# Patient Record
Sex: Male | Born: 1945 | Race: White | Hispanic: No | Marital: Single | Smoking: Current every day smoker
Health system: Southern US, Community
[De-identification: ages and names within clinical notes are randomized; demographics above are authoritative.]

## PROBLEM LIST (undated history)

## (undated) DIAGNOSIS — E781 Pure hyperglyceridemia: Secondary | ICD-10-CM

## (undated) DIAGNOSIS — C801 Malignant (primary) neoplasm, unspecified: Secondary | ICD-10-CM

## (undated) DIAGNOSIS — F32A Depression, unspecified: Secondary | ICD-10-CM

## (undated) DIAGNOSIS — F329 Major depressive disorder, single episode, unspecified: Secondary | ICD-10-CM

## (undated) DIAGNOSIS — T7840XA Allergy, unspecified, initial encounter: Secondary | ICD-10-CM

## (undated) DIAGNOSIS — R0600 Dyspnea, unspecified: Secondary | ICD-10-CM

## (undated) DIAGNOSIS — K219 Gastro-esophageal reflux disease without esophagitis: Secondary | ICD-10-CM

## (undated) DIAGNOSIS — J449 Chronic obstructive pulmonary disease, unspecified: Secondary | ICD-10-CM

## (undated) DIAGNOSIS — J439 Emphysema, unspecified: Secondary | ICD-10-CM

## (undated) DIAGNOSIS — H269 Unspecified cataract: Secondary | ICD-10-CM

## (undated) DIAGNOSIS — F1020 Alcohol dependence, uncomplicated: Secondary | ICD-10-CM

## (undated) DIAGNOSIS — M1990 Unspecified osteoarthritis, unspecified site: Secondary | ICD-10-CM

## (undated) DIAGNOSIS — F419 Anxiety disorder, unspecified: Secondary | ICD-10-CM

## (undated) DIAGNOSIS — I10 Essential (primary) hypertension: Secondary | ICD-10-CM

## (undated) HISTORY — DX: Pure hyperglyceridemia: E78.1

## (undated) HISTORY — DX: Gastro-esophageal reflux disease without esophagitis: K21.9

## (undated) HISTORY — DX: Essential (primary) hypertension: I10

## (undated) HISTORY — DX: Chronic obstructive pulmonary disease, unspecified: J44.9

## (undated) HISTORY — DX: Emphysema, unspecified: J43.9

## (undated) HISTORY — DX: Alcohol dependence, uncomplicated: F10.20

## (undated) HISTORY — DX: Depression, unspecified: F32.A

## (undated) HISTORY — DX: Allergy, unspecified, initial encounter: T78.40XA

## (undated) HISTORY — DX: Anxiety disorder, unspecified: F41.9

## (undated) HISTORY — DX: Major depressive disorder, single episode, unspecified: F32.9

## (undated) HISTORY — DX: Unspecified cataract: H26.9

## (undated) HISTORY — PX: TONSILLECTOMY: SUR1361

---
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
## 2010-06-01 HISTORY — PX: COLONOSCOPY: SHX174

## 2010-12-12 ENCOUNTER — Ambulatory Visit: Payer: Self-pay | Admitting: Family Medicine

## 2012-06-01 HISTORY — PX: CATARACT EXTRACTION, BILATERAL: SHX1313

## 2012-06-14 ENCOUNTER — Ambulatory Visit: Payer: Self-pay | Admitting: Ophthalmology

## 2012-06-14 LAB — POTASSIUM: Potassium: 3.9 mmol/L (ref 3.5–5.1)

## 2012-06-20 ENCOUNTER — Ambulatory Visit: Payer: Self-pay | Admitting: Ophthalmology

## 2012-07-20 IMAGING — US SCREENING ULTRASOUND OF ABDOMINAL AORTA
1 series · 17 of 24 positions shown · non-contrast
Comparison: none

REASON FOR EXAM: AAA screen welcome to Medicare
COMMENTS:

[Series 1: screening ultrasound of abdominal aorta · 17 of 24 slices shown]
[im 1/24]
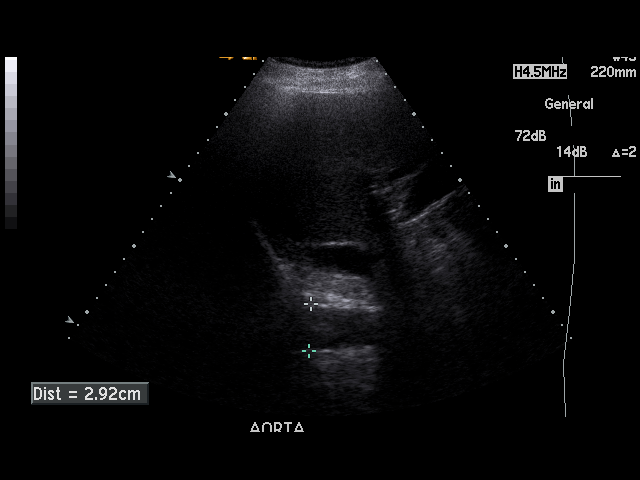
[im 3/24]
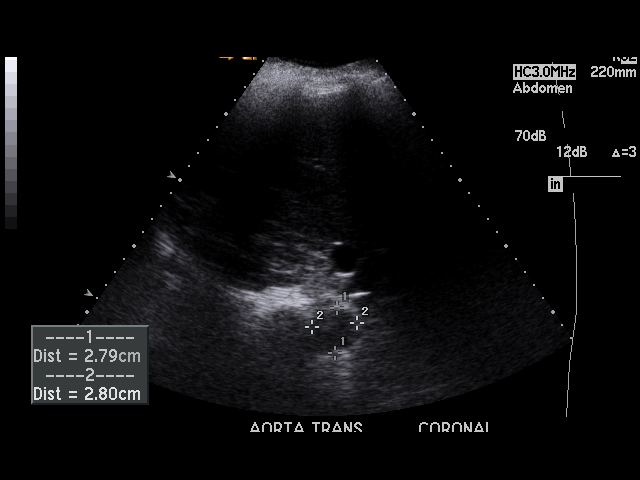
[im 4/24]
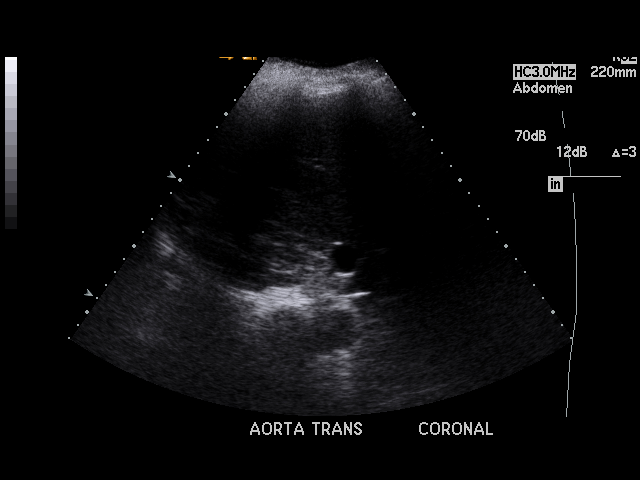
[im 5/24]
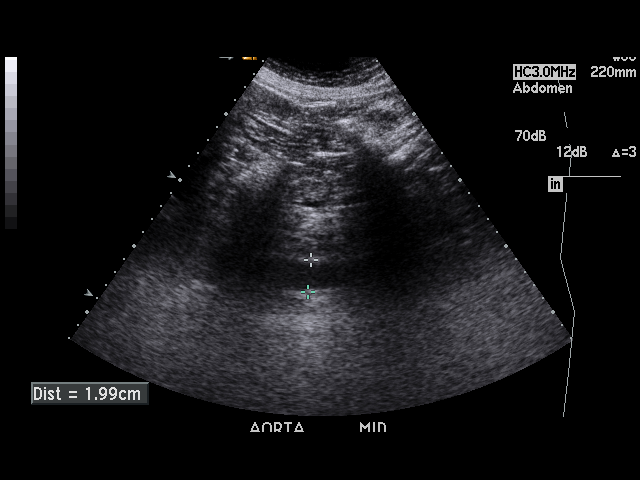
[im 7/24]
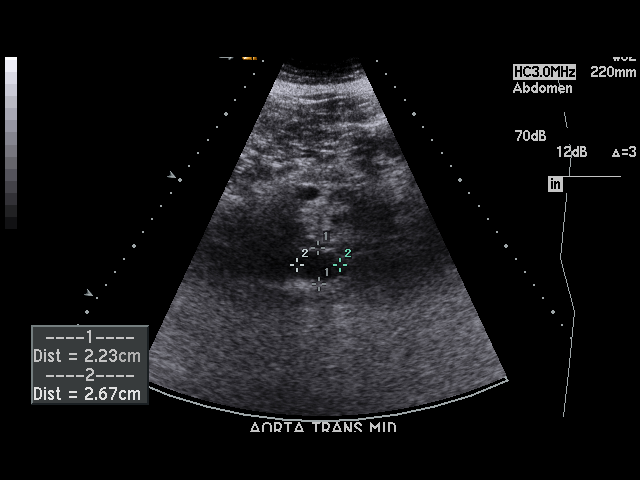
[im 8/24]
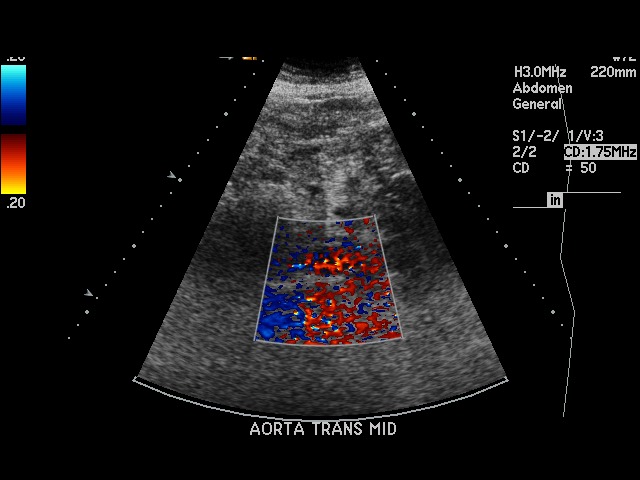
[im 10/24]
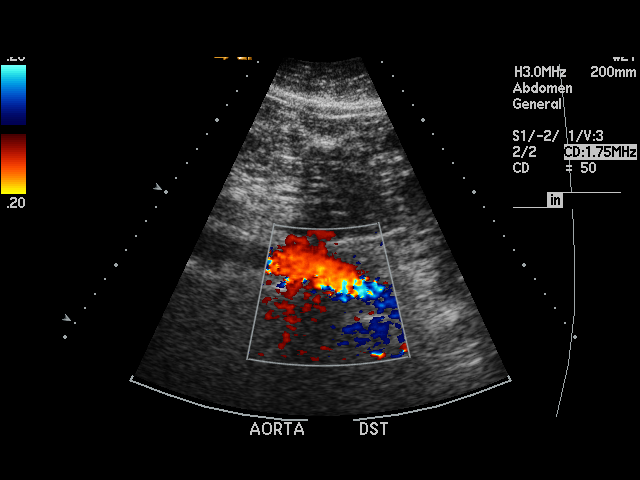
[im 11/24]
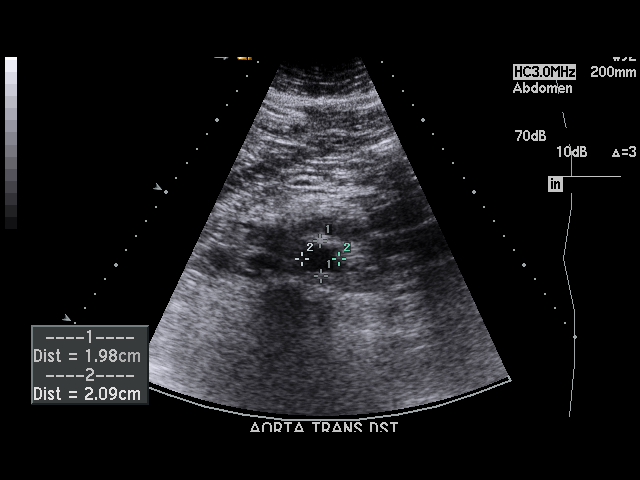
[im 13/24]
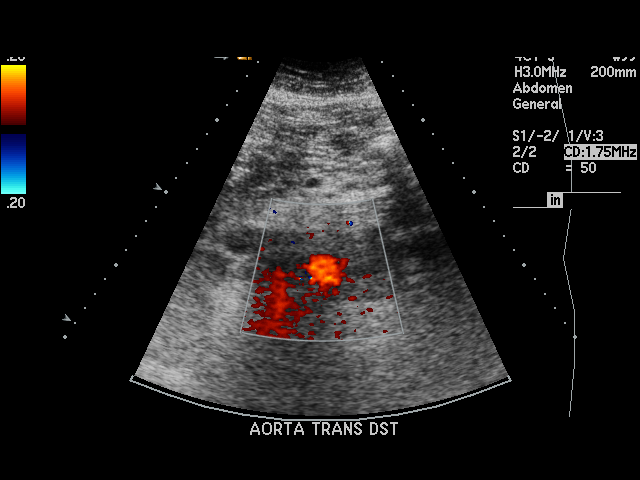
[im 14/24]
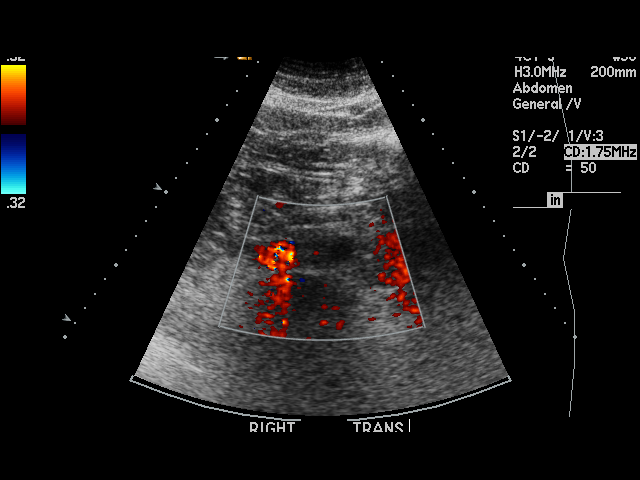
[im 15/24]
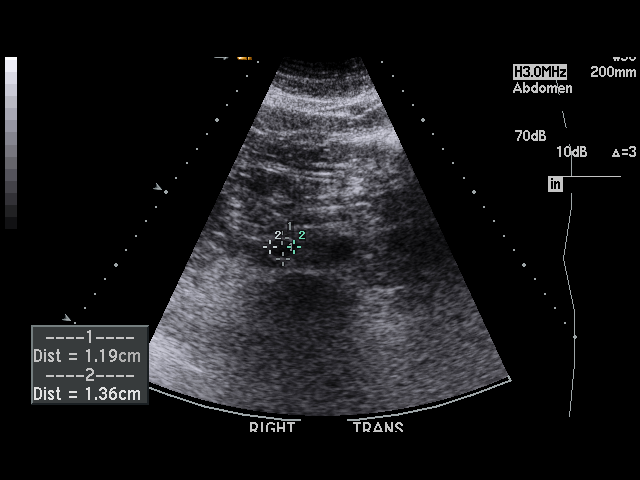
[im 17/24]
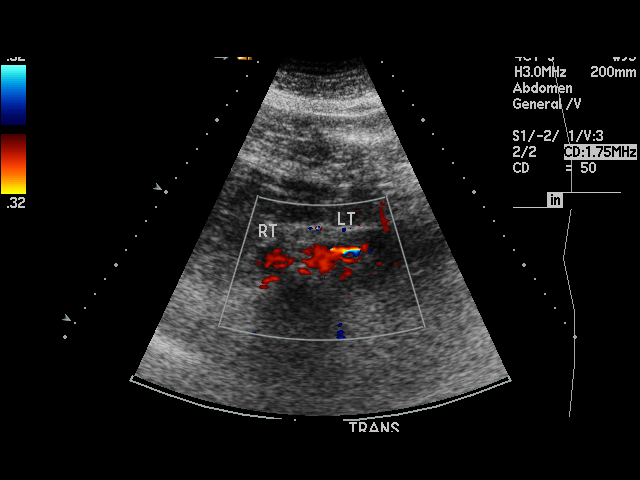
[im 18/24]
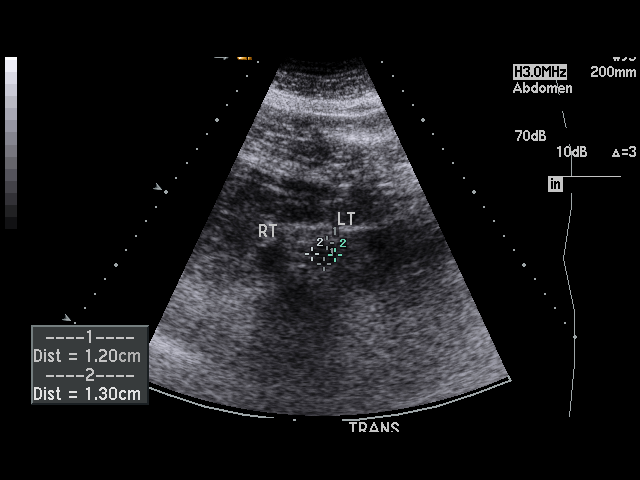
[im 20/24]
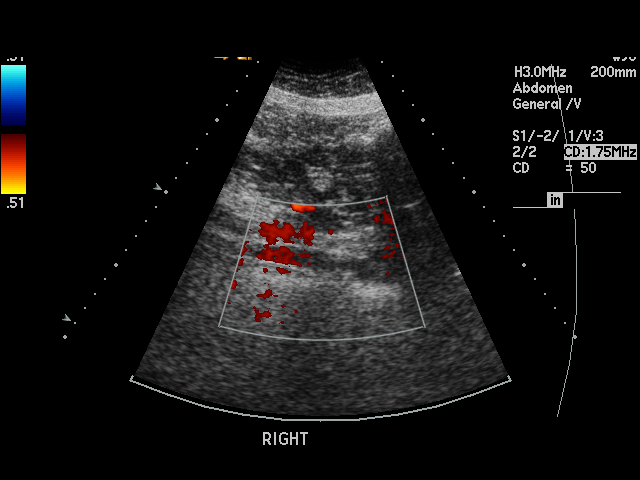
[im 21/24]
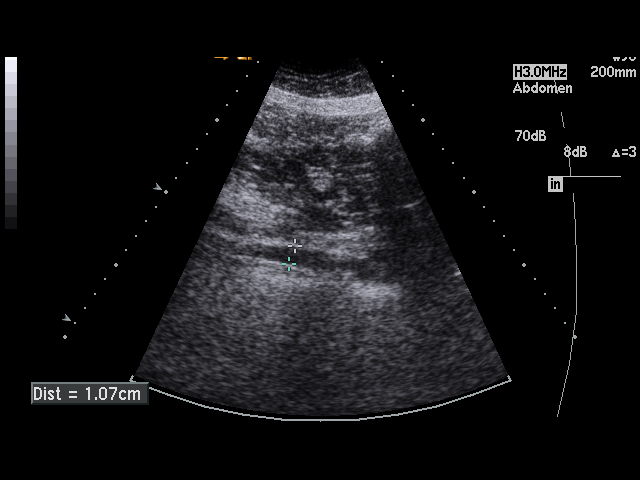
[im 22/24]
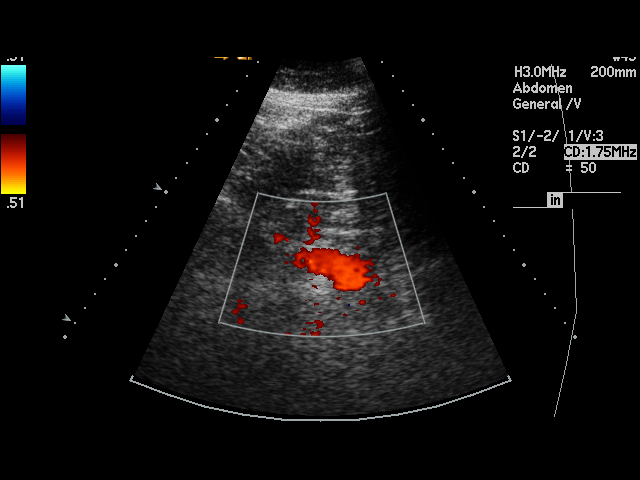
[im 24/24]
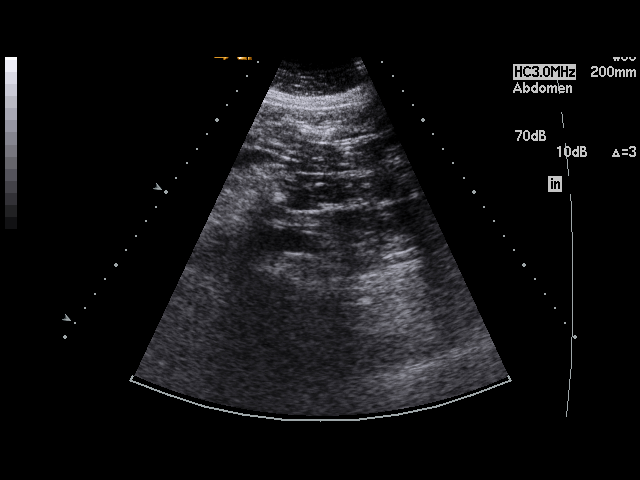

[17 of 24 positions shown; findings below may reference images not displayed]

PROCEDURE:     US  - US EXAM AAA SCREENING  - December 12, 2010  [DATE]

RESULT:     The abdominal aorta is visualized. No aneurysm or other
significant abnormality is identified. The maximum diameter of the abdominal
aorta is 2.8 cm which is measured in the proximal portion of the aorta.
There is normal tapering of the abdominal aorta in its mid and distal
portions. The common iliac vessels are visualized and show no significant
abnormalities.
IMPRESSION: No aneurysm or other significant abnormality of the abdominal
aorta is identified.

## 2012-07-25 ENCOUNTER — Ambulatory Visit: Payer: Self-pay | Admitting: Ophthalmology

## 2012-07-25 LAB — HEMOGLOBIN: HGB: 11.1 g/dL — ABNORMAL LOW (ref 13.0–18.0)

## 2012-08-01 ENCOUNTER — Ambulatory Visit: Payer: Self-pay | Admitting: Ophthalmology

## 2013-09-21 ENCOUNTER — Inpatient Hospital Stay: Payer: Self-pay | Admitting: Internal Medicine

## 2013-09-21 LAB — DRUG SCREEN, URINE

## 2013-09-21 LAB — COMPREHENSIVE METABOLIC PANEL
ALBUMIN: 3.1 g/dL — AB (ref 3.4–5.0)
AST: 51 U/L — AB (ref 15–37)
Alkaline Phosphatase: 87 U/L
Anion Gap: 13 (ref 7–16)
BILIRUBIN TOTAL: 1 mg/dL (ref 0.2–1.0)
BUN: 52 mg/dL — ABNORMAL HIGH (ref 7–18)
CHLORIDE: 91 mmol/L — AB (ref 98–107)
CREATININE: 2.75 mg/dL — AB (ref 0.60–1.30)
Calcium, Total: 8.5 mg/dL (ref 8.5–10.1)
Co2: 25 mmol/L (ref 21–32)
EGFR (African American): 26 — ABNORMAL LOW
GFR CALC NON AF AMER: 23 — AB
Glucose: 196 mg/dL — ABNORMAL HIGH (ref 65–99)
OSMOLALITY: 278 (ref 275–301)
Potassium: 3.3 mmol/L — ABNORMAL LOW (ref 3.5–5.1)
SGPT (ALT): 34 U/L (ref 12–78)
SODIUM: 129 mmol/L — AB (ref 136–145)
Total Protein: 7.8 g/dL (ref 6.4–8.2)

## 2013-09-21 LAB — URINALYSIS, COMPLETE
Bacteria: NONE SEEN
Bilirubin,UR: NEGATIVE
Glucose,UR: 50 mg/dL (ref 0–75)
Hyaline Cast: 3
LEUKOCYTE ESTERASE: NEGATIVE
Nitrite: NEGATIVE
Ph: 5 (ref 4.5–8.0)
RBC,UR: 1 /HPF (ref 0–5)
SPECIFIC GRAVITY: 1.017 (ref 1.003–1.030)
Squamous Epithelial: <1
WBC UR: 1 /HPF (ref 0–5)

## 2013-09-21 LAB — TROPONIN I: Troponin-I: 0.27 ng/mL — ABNORMAL HIGH

## 2013-09-21 LAB — MAGNESIUM: MAGNESIUM: 2 mg/dL

## 2013-09-21 LAB — CBC
HCT: 41.8 % (ref 40.0–52.0)
HGB: 14 g/dL (ref 13.0–18.0)
MCH: 33 pg (ref 26.0–34.0)
MCHC: 33.4 g/dL (ref 32.0–36.0)
MCV: 99 fL (ref 80–100)
Platelet: 137 10*3/uL — ABNORMAL LOW (ref 150–440)
RBC: 4.23 10*6/uL — ABNORMAL LOW (ref 4.40–5.90)
RDW: 15.2 % — AB (ref 11.5–14.5)
WBC: 11.9 10*3/uL — ABNORMAL HIGH (ref 3.8–10.6)

## 2013-09-21 LAB — APTT: Activated PTT: 29 secs (ref 23.6–35.9)

## 2013-09-21 LAB — ETHANOL
Ethanol %: <0.003 % (ref 0.000–0.080)
Ethanol: <3 mg/dL

## 2013-09-21 LAB — TSH: THYROID STIMULATING HORM: 2.3 u[IU]/mL

## 2013-09-21 LAB — PRO B NATRIURETIC PEPTIDE: B-Type Natriuretic Peptide: 42867 pg/mL — ABNORMAL HIGH (ref 0–125)

## 2013-09-21 LAB — CK TOTAL AND CKMB (NOT AT ARMC)
CK, TOTAL: 165 U/L
CK-MB: 5.6 ng/mL — ABNORMAL HIGH (ref 0.5–3.6)

## 2013-09-21 LAB — POTASSIUM: Potassium: 3.6 mmol/L (ref 3.5–5.1)

## 2013-09-22 LAB — BASIC METABOLIC PANEL
Anion Gap: 8 (ref 7–16)
BUN: 41 mg/dL — AB (ref 7–18)
CALCIUM: 7.1 mg/dL — AB (ref 8.5–10.1)
Chloride: 104 mmol/L (ref 98–107)
Co2: 26 mmol/L (ref 21–32)
Creatinine: 1.9 mg/dL — ABNORMAL HIGH (ref 0.60–1.30)
EGFR (African American): 41 — ABNORMAL LOW
GFR CALC NON AF AMER: 36 — AB
Glucose: 113 mg/dL — ABNORMAL HIGH (ref 65–99)
Osmolality: 287 (ref 275–301)
POTASSIUM: 3.2 mmol/L — AB (ref 3.5–5.1)
SODIUM: 138 mmol/L (ref 136–145)

## 2013-09-22 LAB — CBC WITH DIFFERENTIAL/PLATELET
BASOS ABS: 0 10*3/uL (ref 0.0–0.1)
Basophil %: 0.4 %
Eosinophil #: 0 10*3/uL (ref 0.0–0.7)
Eosinophil %: 0.3 %
HCT: 29.8 % — ABNORMAL LOW (ref 40.0–52.0)
HGB: 10 g/dL — AB (ref 13.0–18.0)
Lymphocyte #: 1.9 10*3/uL (ref 1.0–3.6)
Lymphocyte %: 27.7 %
MCH: 33.2 pg (ref 26.0–34.0)
MCHC: 33.6 g/dL (ref 32.0–36.0)
MCV: 99 fL (ref 80–100)
MONOS PCT: 6.2 %
Monocyte #: 0.4 x10 3/mm (ref 0.2–1.0)
NEUTROS ABS: 4.4 10*3/uL (ref 1.4–6.5)
Neutrophil %: 65.4 %
Platelet: 73 10*3/uL — ABNORMAL LOW (ref 150–440)
RBC: 3.01 10*6/uL — ABNORMAL LOW (ref 4.40–5.90)
RDW: 15 % — ABNORMAL HIGH (ref 11.5–14.5)
WBC: 6.7 10*3/uL (ref 3.8–10.6)

## 2013-09-22 LAB — TROPONIN I
Troponin-I: 0.2 ng/mL — ABNORMAL HIGH
Troponin-I: 0.18 ng/mL — ABNORMAL HIGH

## 2013-09-22 LAB — APTT
ACTIVATED PTT: 42.9 s — AB (ref 23.6–35.9)
Activated PTT: 28.9 secs (ref 23.6–35.9)

## 2013-09-22 LAB — CK-MB
CK-MB: 3.5 ng/mL (ref 0.5–3.6)
CK-MB: 3.3 ng/mL (ref 0.5–3.6)

## 2013-09-22 LAB — MAGNESIUM: Magnesium: 1.8 mg/dL

## 2013-09-23 LAB — BASIC METABOLIC PANEL
Anion Gap: 5 — ABNORMAL LOW (ref 7–16)
BUN: 22 mg/dL — AB (ref 7–18)
CALCIUM: 8.1 mg/dL — AB (ref 8.5–10.1)
Chloride: 110 mmol/L — ABNORMAL HIGH (ref 98–107)
Co2: 28 mmol/L (ref 21–32)
Creatinine: 1.26 mg/dL (ref 0.60–1.30)
EGFR (African American): >60
EGFR (Non-African Amer.): 59 — ABNORMAL LOW
Glucose: 98 mg/dL (ref 65–99)
Osmolality: 288 (ref 275–301)
POTASSIUM: 3.7 mmol/L (ref 3.5–5.1)
Sodium: 143 mmol/L (ref 136–145)

## 2013-09-23 LAB — URINE CULTURE

## 2013-10-12 DIAGNOSIS — Z8679 Personal history of other diseases of the circulatory system: Secondary | ICD-10-CM | POA: Insufficient documentation

## 2014-09-21 NOTE — Op Note (Signed)
PATIENT NAME:  Carl Allen, Carl Allen MR#:  [id number] DATE OF BIRTH:  September 08, 1945  DATE OF PROCEDURE:  06/20/2012  PREOPERATIVE DIAGNOSIS:  Cataract, right eye.   POSTOPERATIVE DIAGNOSIS:  Cataract, right eye.  PROCEDURE PERFORMED:  Extracapsular cataract extraction using phacoemulsification with placement of an Alcon SN6CWS, 17.5-diopter posterior chamber lens, serial [id number].  SURGEON:  Loura Back. Mariana Goytia, MD  ASSISTANT:  None.  ANESTHESIA:  4% lidocaine and 0.75% Marcaine in a 50:50 mixture with 10 units/mL of Hylenex added, given as a peribulbar.  ANESTHESIOLOGIST:  Liam Rogers, MD   COMPLICATIONS:  None.  ESTIMATED BLOOD LOSS:  Less than 1 mL.  DESCRIPTION OF PROCEDURE:  The patient was brought to the operating room and given a peribulbar block.  The patient was then prepped and draped in the usual fashion.  The vertical rectus muscles were imbricated using 5-0 silk sutures.  These sutures were then clamped to the sterile drapes as bridle sutures.  A limbal peritomy was performed extending two clock hours and hemostasis was obtained with cautery.  A partial thickness scleral groove was made at the surgical limbus and dissected anteriorly in a lamellar dissection using an Alcon crescent knife.  The anterior chamber was entered superonasally with a Superblade and through the lamellar dissection with a 2.6 mm keratome.  DisCoVisc was used to replace the aqueous and a continuous tear capsulorrhexis was carried out.  Hydrodissection and hydrodelineation were carried out with balanced salt and a 27 gauge canula.  The nucleus was rotated to confirm the effectiveness of the hydrodissection.  Phacoemulsification was carried out using a divide-and-conquer technique.  Total ultrasound time was 1 minute and 29 seconds with an average power of 8.5 percent, CDE of 39.37.  Irrigation/aspiration was used to remove the residual cortex.  DisCoVisc was used to inflate the capsule and the internal  incision was enlarged to 3 mm with the crescent knife.  The intraocular lens was folded and inserted into the capsular bag using the AcrySert Delivery System.   Irrigation/aspiration was used to remove the residual DisCoVisc.  Miostat was injected into the anterior chamber through the paracentesis track to inflate the anterior chamber and induce miosis.  Cefuroxime, 0.1 mL, was injected into the anterior chamber. The wound was checked for leaks and none were found. The conjunctiva was closed with cautery and the bridle sutures were removed.  Two drops of 0.3% Vigamox were placed on the eye.   An eye shield was placed on the eye.  The patient was discharged to the recovery room in good condition.  ____________________________ Loura Back Jiovanny Burdell, MD sad:cb D: 06/20/2012 13:42:55 ET T: 06/20/2012 14:16:12 ET JOB#: [id number]  cc: Remo Lipps A. Dreux Mcgroarty, MD, <Dictator> Martie Lee MD ELECTRONICALLY SIGNED 06/27/2012 13:18

## 2014-09-21 NOTE — Op Note (Signed)
PATIENT NAME:  Carl Allen, Carl Allen MR#:  [id number] DATE OF BIRTH:  1946-03-30  DATE OF PROCEDURE:  08/01/2012  PREOPERATIVE DIAGNOSIS: Cataract, left eye.   POSTOPERATIVE DIAGNOSIS: Cataract, left eye.   PROCEDURE PERFORMED: Extracapsular cataract extraction using phacoemulsification with placement of an Alcon SN6CWS 18.5 diopter posterior chamber lens, serial number [id number].   ANESTHESIA: 4% lidocaine and 0.75% Marcaine in a 50:50 mixture, with 10 units/mL of Hylenex added, given as a peribulbar.   ANESTHESIOLOGIST: Dr. Boston Service.   COMPLICATIONS: None.   ESTIMATED BLOOD LOSS: Less than 1 mL.   DESCRIPTION OF PROCEDURE:  The patient was brought to the operating room and given a peribulbar block.  The patient was then prepped and draped in the usual fashion.  The vertical rectus muscles were imbricated using 5-0 silk sutures.  These sutures were then clamped to the sterile drapes as bridle sutures.  A limbal peritomy was performed extending two clock hours and hemostasis was obtained with cautery.  A partial thickness scleral groove was made at the surgical limbus and dissected anteriorly in a lamellar dissection using an Alcon crescent knife.  The anterior chamber was entered supero-temporally with a Superblade and through the lamellar dissection with a 2.6 mm keratome.  DisCoVisc was used to replace the aqueous and a continuous tear capsulorrhexis was carried out.  Hydrodissection and hydrodelineation were carried out with balanced salt and a 27-gauge cannula. The nucleus was rotated to confirm the effectiveness of the hydrodissection.  Phacoemulsification was carried out using a divide-and-conquer technique.  Total ultrasound time was 1 minute 47.7 seconds, with an average power of 24.9%, CDE of 52.21.   Irrigation/aspiration was used to remove the residual cortex.  DisCoVisc was used to inflate the capsule and the internal incision was enlarged to 3 mm with the crescent knife.  The  intraocular lens was folded and inserted into the capsular bag using the AcrySert delivery system instead of the [employer].  A suture was placed.  Irrigation/aspiration was used to remove the residual DisCoVisc.  Miostat was injected into the anterior chamber through the paracentesis track to inflate the anterior chamber and induce miosis.  The wound was checked for leaks and none were found. The conjunctiva was closed with cautery and the bridle sutures were removed.  Two drops of 0.3% Vigamox were placed on the eye.   An eye shield was placed on the eye.  The patient was discharged to the recovery room in good condition.   ____________________________ Loura Back Darrelle Barrell, MD sad:dm D: 08/01/2012 13:38:00 ET T: 08/01/2012 14:05:31 ET JOB#: [id number]  cc: Remo Lipps A. Cate Oravec, MD, <Dictator>Neizan Debruhl A. Mccrae Speciale, MD, <Dictator> Martie Lee MD ELECTRONICALLY SIGNED 08/08/2012 8:23

## 2014-09-22 NOTE — H&P (Signed)
PATIENT NAME:  Carl Allen, Carl Allen MR#:  [id number] DATE OF BIRTH:  1945/10/03  DATE OF ADMISSION:  09/21/2013  PRIMARY CARE PHYSICIAN:  Dr. Jeananne Rama.   EMERGENCY ROOM PHYSICIAN:  Dr. Benjaman Lobe.   CHIEF COMPLAINT:  Syncope.   HISTORY OF PRESENT ILLNESS:  This patient is a 69 year old male patient with a history of hypertension.  He had syncope while he was visiting his mom in the Emergency Room.  The patient's mom was informed too and he became unresponsive and had a twitching movement of upper extremities which lasted for 30 seconds and the patient became responsive and the patient found to have cool, clammy skin.  The patient found to have a heart rate of 149 and blood pressure 61/48 and he has had atrial flutter with variable block on the monitor and the patient has a history of heavy alcohol abuse and drinks every day.  Last drink was 3 to 4 days ago.  The patient did receive 4 liters of fluid because of hypotension and he received digoxin 0.25 mg IV one dose and the patient continued to have A. Fib with RVR with heart rate 145 beats per minute and all through this time he is awake, alert, oriented.  Denies any complaints.  The patient mentioned that he has had some allergy problems recently and not eating and drinking for the last few days, but nothing other than that.  The patient does not have any kidney problems.   PAST MEDICAL HISTORY:  Significant for hypertension and emphysema.   PAST SURGICAL HISTORY:  Significant for cataract surgery before.   ALLERGIES:  None.   SOCIAL HISTORY:  Smokes 1 pack per day.  Drinks heavily.  Last drink was 3 to 4 days ago, drinks a fifth of scotch.   FAMILY HISTORY:  Mother has hypertension.   HOME MEDICATIONS:  Include Coreg 25 mg by mouth twice daily, fenofibrate 134 mg by mouth daily, HCTZ losartan 25/100 mg by mouth daily, Zoloft 50 mg by mouth daily, Spiriva 18 mcg inhalation daily.   REVIEW OF SYSTEMS:  CONSTITUTIONAL:  Denies any fever or fatigue.   EYES:  No blurred vision.  EARS, NOSE, THROAT:  No tinnitus.  No epistaxis.  No difficulty swallowing.   RESPIRATION:  No cough.  No wheezing.  CARDIOVASCULAR:  No chest pain, orthopnea, or palpitations.  GASTROINTESTINAL:  No nausea, no vomiting, no abdominal pain.  GENITOURINARY:  No dysuria.  ENDOCRINE:  No polyuria.  No polydipsia.  INTEGUMENT:  No skin rashes.  MUSCULOSKELETAL:  No joint pains.  NEUROLOGIC:  Denies any numbness or weakness.  PSYCHIATRIC:  No anxiety or insomnia.   PHYSICAL EXAMINATION:  VITAL SIGNS:  Temperature 97.7, heart rate 148.  The patient initially was in sinus tach and then had atrial flutter.  Right now, he is in sinus tach during my visit, heart rate is around 140s, blood pressure is still low at 61/49, sats 95% on room air.  GENERAL:  He is alert, awake, oriented, elderly male, not in distress, answering questions appropriately.  HEENT:  Head normocephalic, atraumatic.  EYES:  Pupils equal, reacting to light.  Extraocular movements are intact.  EARS, NOSE, THROAT:  No tympanic membrane congestion.  No turbinate hypertrophy.  No oropharyngeal erythema.  NECK:  Supple.  No JVD.  No carotid bruits.  RESPIRATORY:  Clear to auscultation.  No wheeze.  No rales.  CARDIOVASCULAR:  S1, S2 regular.  Tachycardic.  No peripheral edema.  GASTROINTESTINAL:  Abdomen is soft, nontender, nondistended.  Bowel sounds present.  MUSCULOSKELETAL:  Able to move extremities x 4.  SKIN:  Warm and dry.  He does have some left leg dry skin.  NEUROLOGIC:  Alert, awake, oriented.  No focal neurological deficit.  The patient was awake, oriented.  Cranial nerves II through XII intact.  Power 5 out of 5 upper and lower extremities.  Sensation intact.  DTRs 2+ bilaterally.  PSYCHIATRIC:  Mood and affect are within normal limits.   LABORATORY DATA:  The patient's electrolytes, sodium is 129, potassium 3.3, chloride 91, bicarb 25, BUN 52, creatinine 2.75, glucose 196.  BNP [id number].  Chest  x-ray showed no active cardiopulmonary disease.  Urine toxicology is negative.  UA is positive for some blood, but otherwise clear.  Potassium 3.6.  Alcohol level less than 3.  The patient's urine toxicology is negative.  Chest x-ray shows no acute cardiopulmonary disease as I mentioned.  WBC 11.9, hemoglobin 14, hematocrit 41.8, platelets 137.  Troponin slightly up at 0.27, magnesium  TSH is 2.30.  The patient's EKG shows sinus tachycardia at 149 beats per minute and has a narrow complex tachycardia.  The patient's heart rate has been around 130s to 140s all the time and blood pressure is low at 58/45.  The second EKG showed atrial flutter with variable AV block.    ASSESSMENT AND PLAN:   1.  The patient is a 69 year old male with syncope secondary to atrial fibrillation with rapid ventricular response and hypotension.  The patient receiveD Cardizem  in ER along with 4 liters of fluid and he is alert, awake, oriented, seen by Dr. Ubaldo Glassing.  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  Amiodarone did help with tachycardia to get the heart rate to 120s and we give the amiodarone drip and admit him to Intensive Care Unit.  Dr. Ubaldo Glassing will see the patient tomorrow and get echocardiogram.   2.  Regarding hypotension, the patient already received fluids 4 liters of normal saline.  Continue normal saline at 100 mL an hour and see how the blood pressure improves.  3.  Acute renal failure.  The patient is dehydrated with hyponatremia, hypokalemia, likely has acute tubular necrosis secondary to poor by mouth intake for the last few days.  Continue intravenous  fluids and recheck BMP tomorrow.  4.  Hypokalemia, replaced in the Emergency Room.  The patient is high risk for cardiac arrest because of severe tachycardia.  At this time, continue heparin and amiodarone drip.  5.  The patient's other diagnoses include heavy alcohol abuse.  Continues CIWA protocol.  We will use Ativan and Haldol as needed.  6.  Tobacco abuse.  We will order nicotine patch.   TIME SPENT:  About 65 minutes.    ____________________________ Epifanio Lesches, MD sk:ea D: 09/21/2013 21:06:24 ET T: 09/22/2013 00:02:56 ET JOB#: [id number]  cc: Epifanio Lesches, MD, <Dictator> Epifanio Lesches MD ELECTRONICALLY SIGNED 10/25/2013 9:47

## 2014-09-22 NOTE — Discharge Summary (Signed)
PATIENT NAME:  Carl Allen, Carl Allen MR#:  [id number] DATE OF BIRTH:  05-17-1946  DATE OF ADMISSION:  09/21/2013 DATE OF DISCHARGE:  09/24/2013  ADMITTING PHYSICIAN: Dr. Vianne Bulls.  DISCHARGING PHYSICIAN: Gladstone Lighter, MD  PATIENT PRIMARY PHYSICIAN: Dr. Golden Pop.  CONSULTATIONS IN THE HOSPITAL: Cardiology consultation with Dr. Ubaldo Glassing.   DISCHARGE DIAGNOSES:  1. Syncope.  2. Atrial fibrillation with rapid ventricular response now in normal sinus rhythm.  3. Chronic obstructive pulmonary disease exacerbation.  4. Hypertension.  5. Alcohol abuse.  6. Tobacco use disorder.  7. Hyperkalemia which is resolved.  8. Acute renal failure, which is resolved.   DISCHARGE HOME MEDICATIONS:  1. Fenofibrate 134 mg capsule 1 capsule p.o. daily.  2. Sertraline 50 mg p.o. daily.  3. Spiriva HandiHaler 1 inhalation daily.  4. Losartan hydrochlorothiazide 100/25 mg half tablet p.o. daily.  5. Carvedilol 6.25 mg p.o. b.i.d.  6. Prednisone taper over the next 4 days.  7. Advair 250/50 1 puff b.i.d.  8. Combivent Respimat 1 puff 4 times a day.   DISCHARGE DIET: Low-sodium diet.   DISCHARGE ACTIVITY: As tolerated.   FOLLOWUP INSTRUCTIONS:  1. PCP followup in 1-2 weeks.  2. Cardiology followup in 2-3 weeks.   LABORATORIES AND IMAGING STUDIES PRIOR TO DISCHARGE: Sodium 143, potassium 3.7, chloride 110, bicarbonate 28, BUN 22, creatinine 1.2, glucose 98, and calcium of 8.1.   WBC 6.7, hemoglobin 10.0, hematocrit 29.8, platelet count is 73,000. Echo Doppler showing LV ejection fraction is 50% to 55% low normal, global left ventricular systolic function, mildly dilated left atrium noted. Troponin slightly elevated at 0.20 and 0.18, likely demand ischemia. Alcohol level was negative. Urinalysis negative for any infection. Urine cultures were negative. Urine tox screen was negative and chest x-ray showed clear lung fields. TSH within normal limits at 2.3. Magnesium normal at 2.0.  BRIEF HOSPITAL  COURSE: Carl Allen is a 69 year old Caucasian male with past medical history significant for hypertension, COPD, ongoing smoking, and alcohol abuse, who came to the hospital actually visiting his mom who was in the Emergency Room and experienced a syncopal episode.  1. Syncope. Likely vasovagal and related to hypotension when he presented with. He was rather dehydrated and was also noted to be in atrial fibrillation with RVR. He has not had further syncopal episode while in the hospital and his blood pressure improved with IV fluids. 2. Atrial fibrillation with rapid ventricular response likely triggered by his hypotension. His echo showed normal LV ejection fraction, though it has a history of alcohol abuse. No cardiomegaly, no cardiomyopathy changes were noted. He was started on a lower dose of Coreg and converted back to normal sinus rhythm. He takes Coreg at home at a higher dose; however, has not been taking it for a few days. He is on lower dose of Coreg at the time of discharge because the patient has had sinus bradycardia consistently at nighttime when he is sleeping heart rate dropping to as low as 30 but the patient being asymptomatic at the time. Cardiology has followed the patient while in the hospital.  3. Tobacco use and chronic obstructive pulmonary disease exacerbation. Started on steroids with restarting his inhalers which he was noncompliant to as an outpatient. Improved breathing at this time. Not requiring any O2 at the time of discharge.  4. Hypertension. The patient was hypotensive on admission. His medications are being adjusted. He is on low-dose Coreg and also half the dose of his losartan hydrochlorothiazide at the time of discharge.  5.  Hypokalemia and acute renal failure secondary to prerenal condition. Have been improved after supplementation with fluids and potassium supplements.   His course has been otherwise uneventful in the hospital.   DISCHARGE CONDITION: Stable.    DISCHARGE DISPOSITION: Home.   TIME SPENT ON DISCHARGE: 40 minutes.   ____________________________ Gladstone Lighter, MD rk:lt D: 09/24/2013 12:15:00 ET T: 09/24/2013 23:24:00 ET JOB#: [id number]  cc: Gladstone Lighter, MD, <Dictator> [name], MD Gladstone Lighter MD ELECTRONICALLY SIGNED 10/18/2013 13:51

## 2014-09-22 NOTE — Consult Note (Signed)
Brief Consult Note: Diagnosis: Pt with histoyr of heavy etoh abuse, tobacco abuse but no cardiac history who was visiting his mother in the er tonight and had a syncopal episode. Noted to be in afib with rvr and hypotensive.   Patient was seen by consultant.   Recommend further assessment or treatment.   Discussed with Attending MD.   Comments: 69 yo male with history of etoh abuse who sufferred a syncopal episode while visiting his critically ill mother in the er. Found to have afib with rvr and hypotension. recieved bolus with iv fluids time several liters. Labs revealed creatinine of 2.75, mild troponin elevation to 0.2, K 3.3. Currently sbp 101, heart rate 141 after a 0.25 mg iv bolus of digoxin. Mentating well in trendelenberg . No chest pain. States he is on carvediolol and losartan at home. He states he has been out of his carvedilol for at least one day. Will continue to attempt to control heart rate. Sharren Bridge has been in  afib for some time. Sharren Bridge has a cardiomyopathy. Will attempt to avoid cardioversion given high risk for cva. Will treat with iv heparin and asa for now. Rule out for mi. Echo to evaluate lv funciton. Attempt to get records from Dr. Durenda Age office. Will attempt to add back carvedilol as pressure tolerates. Will attempt to avoid pressors as long as patient is mentating well to avoid stimulating heart rate. May need amiodarone bolus if rate does not improve. Have given one bolus of 0.25 mg digoxin but renal funciton will preclude long term use of this. Follow for etoh withdrawl..  Electronic Signatures: Teodoro Spray (MD)  (Signed 23-Apr-15 18:44)  Authored: Brief Consult Note   Last Updated: 23-Apr-15 18:44 by Teodoro Spray (MD)

## 2014-11-05 ENCOUNTER — Other Ambulatory Visit: Payer: Self-pay | Admitting: Family Medicine

## 2014-11-06 ENCOUNTER — Telehealth: Payer: Self-pay | Admitting: Family Medicine

## 2014-11-06 ENCOUNTER — Other Ambulatory Visit: Payer: Self-pay | Admitting: Family Medicine

## 2014-11-06 NOTE — Telephone Encounter (Signed)
[facility]'s patient, can you address please?

## 2014-11-06 NOTE — Telephone Encounter (Signed)
Please schedule for PE in July with [facility] per PP notes

## 2014-11-06 NOTE — Telephone Encounter (Signed)
Pt called stated he has been taking to much of his medication, he did not notice the dosage had been decreased. Needs new RX as the pharmacy will not allow him to refill his medication until 11/25/14. Pt stated he can not be that long without his BP medication.   Medication: Losartan  Pharm: [facility] on [address].  Please call pt with any issues. Pt will be out of his medication tomorrow.  Thanks.

## 2014-11-06 NOTE — Telephone Encounter (Signed)
Patient is not in practice partner with this DOB. Is this a [facility] patient?

## 2014-11-06 NOTE — Telephone Encounter (Signed)
August 8 @ 2:00

## 2014-11-07 ENCOUNTER — Telehealth: Payer: Self-pay | Admitting: Family Medicine

## 2014-11-07 NOTE — Telephone Encounter (Signed)
That is fine thank you .  

## 2014-11-07 NOTE — Telephone Encounter (Signed)
I have added this pt to [facility] schedule for Friday. Pt took the wrong dosage of his medication and is now out of his Losartan. The pharmacy has provided him with enough medication to last him until Friday. Pt is a [facility] pt.

## 2014-11-09 ENCOUNTER — Encounter: Payer: Self-pay | Admitting: Family Medicine

## 2014-11-09 ENCOUNTER — Ambulatory Visit (INDEPENDENT_AMBULATORY_CARE_PROVIDER_SITE_OTHER): Payer: PPO | Admitting: Family Medicine

## 2014-11-09 VITALS — BP 122/80 | HR 66 | Temp 98.2°F | Ht 68.6 in | Wt 157.6 lb

## 2014-11-09 DIAGNOSIS — I10 Essential (primary) hypertension: Secondary | ICD-10-CM | POA: Diagnosis not present

## 2014-11-09 DIAGNOSIS — Z7141 Alcohol abuse counseling and surveillance of alcoholic: Secondary | ICD-10-CM

## 2014-11-09 DIAGNOSIS — H6123 Impacted cerumen, bilateral: Secondary | ICD-10-CM | POA: Diagnosis not present

## 2014-11-09 DIAGNOSIS — H6120 Impacted cerumen, unspecified ear: Secondary | ICD-10-CM | POA: Insufficient documentation

## 2014-11-09 MED ORDER — LOSARTAN POTASSIUM-HCTZ 100-25 MG PO TABS: 1.0000 | ORAL_TABLET | Freq: Every day | ORAL | Status: DC

## 2014-11-09 MED ORDER — LOSARTAN POTASSIUM-HCTZ 100-25 MG PO TABS: 0.5000 | ORAL_TABLET | Freq: Every day | ORAL | Status: DC

## 2014-11-09 MED ORDER — CARBAMIDE PEROXIDE 6.5 % OT SOLN: 5.0000 [drp] | Freq: Two times a day (BID) | OTIC | Status: DC

## 2014-11-09 NOTE — Assessment & Plan Note (Signed)
Not interested in medication at this time, if having trouble, will call and we will send in antabuse.

## 2014-11-09 NOTE — Addendum Note (Signed)
Addended by: Valerie Roys on: 11/09/2014 12:07 PM   Modules accepted: Miquel Dunn

## 2014-11-09 NOTE — Assessment & Plan Note (Signed)
New Rx sent to his pharmacy. Continue on 1/2 a pill. Patient to return in August for regular physical with labs. Discussed that with how good his BP is doing, he was probably too low on a whole pill. Continue to monitor.

## 2014-11-09 NOTE — Progress Notes (Signed)
BP 122/80 mmHg  Pulse 66  Temp(Src) 98.2 F (36.8 C)  Ht 5' 8.6" (1.742 m)  Wt 157 lb 9.6 oz (71.487 kg)  BMI 23.56 kg/m2  SpO2 96%   Subjective:    Patient ID: Carl Allen, male    DOB: [birth year], 69 y.o.   MRN: [id number]  HPI: Carl Allen is a 69 y.o. male  Chief Complaint  Patient presents with  . Hypertension    pt states medication was decreased to one half, got confused and started taking whole pill again  . Cerumen Impaction   HYPERTENSION- has been taking 1/2 a pill again for the past several days. Has been feeling well, but now can't get his medicine from the pharmacy as the insurance won't pay for it because he was taking too much.  Hypertension status: controlled Satisfied with current treatment? yes Duration of hypertension: chronic BP monitoring frequency:  not checking BP medication side effects:  no Medication compliance: excellent compliance Aspirin: no Recurrent headaches: no Visual changes: no Palpitations: no Dyspnea: no Chest pain: no Lower extremity edema: no Dizzy/lightheaded: no   EAG CLOGGED- usually gets his ears unclogged about 1x per year with Dr. Jeananne Rama, but notes now that his ears seem to be worse than normal and he has been having trouble hearing. Would like to have them flushed today.  Duration: weeks Involved ear(s): "right Sensation of feeling clogged/plugged: yes Decreased/muffled hearing:yes Ear pain: no Fever: no Otorrhea: no Hearing loss: yes Upper respiratory infection symptoms: no Using Q-Tips: no Status: worse History of cerumenosis: yes Treatments attempted: debrox  Stopped drinking about 2 weeks ago. Was shakey the first day, but feeling better since then. No interest in taking anything for cravings at this time.   Relevant past medical, surgical, family and social history reviewed and updated as indicated. Interim medical history since our last visit reviewed. Allergies and medications reviewed and  updated.  Review of Systems  Constitutional: Negative.   HENT: Negative.   Respiratory: Negative.   Cardiovascular: Negative.   Psychiatric/Behavioral: Negative.     Per HPI unless specifically indicated above     Objective:    BP 122/80 mmHg  Pulse 66  Temp(Src) 98.2 F (36.8 C)  Ht 5' 8.6" (1.742 m)  Wt 157 lb 9.6 oz (71.487 kg)  BMI 23.56 kg/m2  SpO2 96%  Wt Readings from Last 3 Encounters:  11/09/14 157 lb 9.6 oz (71.487 kg)  07/26/14 158 lb (71.668 kg)    Physical Exam  Constitutional: He appears well-developed and well-nourished.  HENT:  Head: Normocephalic and atraumatic.  Right Ear: Hearing normal.  Left Ear: Hearing normal.  Cerumen impaction bilaterally R more compacted and harder than left  Eyes: Conjunctivae are normal.  Cardiovascular: Normal rate, regular rhythm and normal heart sounds.  Exam reveals no gallop and no friction rub.   No murmur heard. Pulmonary/Chest: Effort normal and breath sounds normal. No respiratory distress. He has no wheezes. He has no rales. He exhibits no tenderness.  Skin: Skin is warm and dry.  Psychiatric: He has a normal mood and affect. Judgment normal.  Nursing note and vitals reviewed.     Assessment & Plan:   Problem List Items Addressed This Visit      Cardiovascular and Mediastinum   Essential hypertension - Primary    New Rx sent to his pharmacy. Continue on 1/2 a pill. Patient to return in August for regular physical with labs. Discussed that with how good his BP  is doing, he was probably too low on a whole pill. Continue to monitor.       Relevant Medications   losartan-hydrochlorothiazide (HYZAAR) 100-25 MG per tablet     Nervous and Auditory   Cerumen impaction    Cerumen removed bilaterally, but it was quite hard and impacted. Patient uncomfortable. Will have him use debrox at home for 3-4 days and then return for further flushing if needed. Rx for debrox given today.         Other   Alcohol abuse  counseling and surveillance    Not interested in medication at this time, if having trouble, will call and we will send in antabuse.           Follow up plan: Return Next week for cerumen removal, As scheduled for PE with MAC.

## 2014-11-09 NOTE — Assessment & Plan Note (Signed)
Cerumen removed bilaterally, but it was quite hard and impacted. Patient uncomfortable. Will have him use debrox at home for 3-4 days and then return for further flushing if needed. Rx for debrox given today.

## 2014-11-09 NOTE — Patient Instructions (Signed)
Cerumen Impaction °A cerumen impaction is when the wax in your ear forms a plug. This plug usually causes reduced hearing. Sometimes it also causes an earache or dizziness. Removing a cerumen impaction can be difficult and painful. The wax sticks to the ear canal. The canal is sensitive and bleeds easily. If you try to remove a heavy wax buildup with a cotton tipped swab, you may push it in further. °Irrigation with water, suction, and small ear curettes may be used to clear out the wax. If the impaction is fixed to the skin in the ear canal, ear drops may be needed for a few days to loosen the wax. People who build up a lot of wax frequently can use ear wax removal products available in your local drugstore. °SEEK MEDICAL CARE IF:  °You develop an earache, increased hearing loss, or marked dizziness. °Document Released: 06/25/2004 Document Revised: 08/10/2011 Document Reviewed: 08/15/2009 °ExitCare® Patient Information ©2015 [employer]. This information is not intended to replace advice given to you by your health care provider. Make sure you discuss any questions you have with your health care provider. ° °

## 2014-11-15 ENCOUNTER — Ambulatory Visit (INDEPENDENT_AMBULATORY_CARE_PROVIDER_SITE_OTHER): Payer: PPO | Admitting: Family Medicine

## 2014-11-15 ENCOUNTER — Encounter: Payer: Self-pay | Admitting: Family Medicine

## 2014-11-15 VITALS — BP 118/72 | HR 71 | Temp 97.8°F | Resp 16 | Wt 161.6 lb

## 2014-11-15 DIAGNOSIS — H6123 Impacted cerumen, bilateral: Secondary | ICD-10-CM

## 2014-11-15 DIAGNOSIS — H6122 Impacted cerumen, left ear: Secondary | ICD-10-CM

## 2014-11-15 NOTE — Assessment & Plan Note (Addendum)
R EAC clear. L EAC still impacted. Moderate amount of cerumen removed from L ear with some hard cerumen impacted at the back of the canal. Will have him use debrox at home for 2 days and call if not improved. Will have him use debrox every 3 months at home to avoid further impaction.

## 2014-11-15 NOTE — Progress Notes (Signed)
   BP 118/72 mmHg  Pulse 71  Temp(Src) 97.8 F (36.6 C)  Resp 16  Wt 161 lb 9.6 oz (73.301 kg)  SpO2 97%   Subjective:    Patient ID: Carl Allen, male    DOB: 01-14-1946, 69 y.o.   MRN: [id number]  HPI: Carl Allen is a 69 y.o. male  Chief Complaint  Patient presents with  . Cerumen Impaction    follow up from last week   EAG CLOGGED Duration: weeks Involved ear(s):  L only Sensation of feeling clogged/plugged: yes Decreased/muffled hearing:yes Ear pain: no Fever: no Otorrhea: no Hearing loss: no Upper respiratory infection symptoms: no Using Q-Tips: no Status: better History of cerumenosis: yes Treatments attempted: debrox  Relevant past medical, surgical, family and social history reviewed and updated as indicated. Interim medical history since our last visit reviewed. Allergies and medications reviewed and updated.  Review of Systems  Constitutional: Negative.   HENT: Negative.     Per HPI unless specifically indicated above     Objective:    BP 118/72 mmHg  Pulse 71  Temp(Src) 97.8 F (36.6 C)  Resp 16  Wt 161 lb 9.6 oz (73.301 kg)  SpO2 97%  Wt Readings from Last 3 Encounters:  11/15/14 161 lb 9.6 oz (73.301 kg)  07/26/14 158 lb (71.668 kg)  11/09/14 157 lb 9.6 oz (71.487 kg)    Physical Exam  Constitutional: He appears well-developed and well-nourished.  HENT:  Head: Normocephalic and atraumatic.  Right Ear: Hearing, tympanic membrane, external ear and ear canal normal.  Left Ear: Hearing normal.  Cerumen impaction on the L EAC  Eyes: Conjunctivae and EOM are normal. Pupils are equal, round, and reactive to light.  Skin: Skin is warm and dry.  Psychiatric: He has a normal mood and affect. His behavior is normal. Judgment normal.        Assessment & Plan:   Problem List Items Addressed This Visit      Nervous and Auditory   Cerumen impaction - Primary    R EAC clear. L EAC still impaction. Moderate amount of cerumen removed  from L ear with some hard cerumen impacted at the back of the canal. Will have him use debrox at home for 2 days and call if not improved. Will have him use debrox every 3 months at home to avoid further impaction.           Follow up plan: Return if symptoms worsen or fail to improve.

## 2014-11-15 NOTE — Patient Instructions (Signed)
Carbamide Peroxide ear solution What is this medicine? CARBAMIDE PEROXIDE (CAR bah mide per OX ide) is used to soften and help remove ear wax. This medicine may be used for other purposes; ask your health care provider or pharmacist if you have questions. COMMON BRAND NAME(S): Auro Ear, Auro Earache Relief, Debrox, Ear Drops, Ear Wax Removal, Ear Wax Remover, Earwax Treatment, Murine, Thera-Ear What should I tell my health care provider before I take this medicine? They need to know if you have any of these conditions: -dizziness -ear discharge -ear pain, irritation or rash -infection -perforated eardrum (hole in eardrum) -an unusual or allergic reaction to carbamide peroxide, glycerin, hydrogen peroxide, other medicines, foods, dyes, or preservatives -pregnant or trying to get pregnant -breast-feeding How should I use this medicine? This medicine is only for use in the outer ear canal. Follow the directions carefully. Wash hands before and after use. The solution may be warmed by holding the bottle in the hand for 1 to 2 minutes. Lie with the affected ear facing upward. Place the proper number of drops into the ear canal. After the drops are instilled, remain lying with the affected ear upward for 5 minutes to help the drops stay in the ear canal. A cotton ball may be gently inserted at the ear opening for no longer than 5 to 10 minutes to ensure retention. Repeat, if necessary, for the opposite ear. Do not touch the tip of the dropper to the ear, fingertips, or other surface. Do not rinse the dropper after use. Keep container tightly closed. Talk to your pediatrician regarding the use of this medicine in children. While this drug may be used in children as young as 12 years for selected conditions, precautions do apply. Overdosage: If you think you have taken too much of this medicine contact a poison control center or emergency room at once. NOTE: This medicine is only for you. Do not share  this medicine with others. What if I miss a dose? If you miss a dose, use it as soon as you can. If it is almost time for your next dose, use only that dose. Do not use double or extra doses. What may interact with this medicine? Interactions are not expected. Do not use any other ear products without asking your doctor or health care professional. This list may not describe all possible interactions. Give your health care provider a list of all the medicines, herbs, non-prescription drugs, or dietary supplements you use. Also tell them if you smoke, drink alcohol, or use illegal drugs. Some items may interact with your medicine. What should I watch for while using this medicine? This medicine is not for long-term use. Do not use for more than 4 days without checking with your health care professional. Contact your doctor or health care professional if your condition does not start to get better within a few days or if you notice burning, redness, itching or swelling. What side effects may I notice from receiving this medicine? Side effects that you should report to your doctor or health care professional as soon as possible: -allergic reactions like skin rash, itching or hives, swelling of the face, lips, or tongue -burning, itching, and redness -worsening ear pain -rash Side effects that usually do not require medical attention (report to your doctor or health care professional if they continue or are bothersome): -abnormal sensation while putting the drops in the ear -temporary reduction in hearing (but not complete loss of hearing) This list may not  describe all possible side effects. Call your doctor for medical advice about side effects. You may report side effects to FDA at 1-800-FDA-1088. Where should I keep my medicine? Keep out of the reach of children. Store at room temperature between 15 and 30 degrees C (59 and 86 degrees F) in a tight, light-resistant container. Keep bottle away  from excessive heat and direct sunlight. Throw away any unused medicine after the expiration date. NOTE: This sheet is a summary. It may not cover all possible information. If you have questions about this medicine, talk to your doctor, pharmacist, or health care provider.  2015, Elsevier/[employer]. (2007-08-30 14:00:02)

## 2015-01-07 ENCOUNTER — Ambulatory Visit (INDEPENDENT_AMBULATORY_CARE_PROVIDER_SITE_OTHER): Payer: PPO | Admitting: Family Medicine

## 2015-01-07 ENCOUNTER — Encounter: Payer: Self-pay | Admitting: Family Medicine

## 2015-01-07 VITALS — BP 138/82 | HR 57 | Temp 98.9°F | Ht 67.0 in | Wt 162.0 lb

## 2015-01-07 DIAGNOSIS — Z7141 Alcohol abuse counseling and surveillance of alcoholic: Secondary | ICD-10-CM

## 2015-01-07 DIAGNOSIS — Z0000 Encounter for general adult medical examination without abnormal findings: Secondary | ICD-10-CM | POA: Diagnosis not present

## 2015-01-07 DIAGNOSIS — J449 Chronic obstructive pulmonary disease, unspecified: Secondary | ICD-10-CM | POA: Insufficient documentation

## 2015-01-07 DIAGNOSIS — E785 Hyperlipidemia, unspecified: Secondary | ICD-10-CM | POA: Insufficient documentation

## 2015-01-07 DIAGNOSIS — I10 Essential (primary) hypertension: Secondary | ICD-10-CM

## 2015-01-07 DIAGNOSIS — J410 Simple chronic bronchitis: Secondary | ICD-10-CM | POA: Diagnosis not present

## 2015-01-07 DIAGNOSIS — E7800 Pure hypercholesterolemia, unspecified: Secondary | ICD-10-CM | POA: Insufficient documentation

## 2015-01-07 DIAGNOSIS — F329 Major depressive disorder, single episode, unspecified: Secondary | ICD-10-CM | POA: Insufficient documentation

## 2015-01-07 DIAGNOSIS — F32A Depression, unspecified: Secondary | ICD-10-CM | POA: Insufficient documentation

## 2015-01-07 LAB — URINALYSIS, ROUTINE W REFLEX MICROSCOPIC
Bilirubin, UA: NEGATIVE
Glucose, UA: NEGATIVE
Ketones, UA: NEGATIVE
LEUKOCYTES UA: NEGATIVE
NITRITE UA: NEGATIVE
Protein, UA: NEGATIVE
Specific Gravity, UA: 1.025 (ref 1.005–1.030)
UUROB: 1 mg/dL (ref 0.2–1.0)
pH, UA: 6 (ref 5.0–7.5)

## 2015-01-07 LAB — MICROSCOPIC EXAMINATION

## 2015-01-07 MED ORDER — TIOTROPIUM BROMIDE MONOHYDRATE 18 MCG IN CAPS: 18.0000 ug | ORAL_CAPSULE | Freq: Every morning | RESPIRATORY_TRACT | Status: DC

## 2015-01-07 MED ORDER — FENOFIBRATE MICRONIZED 134 MG PO CAPS: 134.0000 mg | ORAL_CAPSULE | Freq: Every day | ORAL | Status: DC

## 2015-01-07 MED ORDER — CARVEDILOL 25 MG PO TABS: 25.0000 mg | ORAL_TABLET | Freq: Two times a day (BID) | ORAL | Status: DC

## 2015-01-07 MED ORDER — FLUTICASONE FUROATE-VILANTEROL 100-25 MCG/INH IN AEPB: 1.0000 | INHALATION_SPRAY | Freq: Every morning | RESPIRATORY_TRACT | Status: DC

## 2015-01-07 MED ORDER — SERTRALINE HCL 50 MG PO TABS: 50.0000 mg | ORAL_TABLET | Freq: Every day | ORAL | Status: DC

## 2015-01-07 MED ORDER — LOSARTAN POTASSIUM-HCTZ 100-25 MG PO TABS: 0.5000 | ORAL_TABLET | Freq: Every day | ORAL | Status: DC

## 2015-01-07 NOTE — Progress Notes (Addendum)
BP 138/82 mmHg  Pulse 57  Temp(Src) 98.9 F (37.2 C)  Ht 5\' 7"  (1.702 m)  Wt 162 lb (73.483 kg)  BMI 25.37 kg/m2  SpO2 95%   Subjective:    Patient ID: Carl Allen, male    DOB: 04-17-1946, 69 y.o.   MRN: [id number]  HPI: Carl Allen is a 69 y.o. male  Chief Complaint  Patient presents with  . Annual Exam   patient taking blood pressure medicines no complaints Has cut back on drinking alcohol a lot but still if he drinks is going to drink at all. Which is 1-2 times a month. Patient does not drive or try to work during these times. Not taking the Spiriva every day Sertraline to doing okay and taking every day   Relevant past medical, surgical, family and social history reviewed and updated as indicated. Interim medical history since our last visit reviewed. Allergies and medications reviewed and updated.  Review of Systems  Constitutional: Negative.   HENT: Negative.   Eyes: Negative.   Respiratory: Negative.   Cardiovascular: Negative.   Endocrine: Negative.   Musculoskeletal: Negative.   Skin: Negative.   Allergic/Immunologic: Negative.   Neurological: Negative.   Hematological: Negative.   Psychiatric/Behavioral: Negative.     Per HPI unless specifically indicated above     Objective:    BP 138/82 mmHg  Pulse 57  Temp(Src) 98.9 F (37.2 C)  Ht 5\' 7"  (1.702 m)  Wt 162 lb (73.483 kg)  BMI 25.37 kg/m2  SpO2 95%  Wt Readings from Last 3 Encounters:  01/07/15 162 lb (73.483 kg)  11/15/14 161 lb 9.6 oz (73.301 kg)  07/26/14 158 lb (71.668 kg)    Physical Exam  Constitutional: He is oriented to person, place, and time. He appears well-developed and well-nourished.  HENT:  Head: Normocephalic and atraumatic.  Right Ear: External ear normal.  Left Ear: External ear normal.  Eyes: Conjunctivae and EOM are normal. Pupils are equal, round, and reactive to light.  Neck: Normal range of motion. Neck supple.  Cardiovascular: Normal rate, regular  rhythm, normal heart sounds and intact distal pulses.   Pulmonary/Chest: Effort normal and breath sounds normal.  Abdominal: Soft. Bowel sounds are normal. There is no splenomegaly or hepatomegaly.  Genitourinary: Rectum normal, prostate normal and penis normal.  Musculoskeletal: Normal range of motion.  Neurological: He is alert and oriented to person, place, and time. He has normal reflexes.  Skin: No rash noted. No erythema.  Psychiatric: He has a normal mood and affect. His behavior is normal. Judgment and thought content normal.        Assessment & Plan:   Problem List Items Addressed This Visit      Cardiovascular and Mediastinum   Essential hypertension - Primary    The current medical regimen is effective;  continue present plan and medications.       Relevant Medications   carvedilol (COREG) 25 MG tablet   fenofibrate micronized (LOFIBRA) 134 MG capsule   losartan-hydrochlorothiazide (HYZAAR) 100-25 MG per tablet     Respiratory   COPD (chronic obstructive pulmonary disease)   Relevant Medications   Fluticasone Furoate-Vilanterol (BREO ELLIPTA) 100-25 MCG/INH AEPB   tiotropium (SPIRIVA HANDIHALER) 18 MCG inhalation capsule     Other   Alcohol abuse counseling and surveillance    Stable but still drinking      Depression    ..............Marland KitchenMarland KitchenThe current medical regimen is effective;  continue present plan and medications.  Relevant Medications   sertraline (ZOLOFT) 50 MG tablet   Hypercholesteremia    .Marland KitchenThe current medical regimen is effective;  continue present plan and medications.       Relevant Medications   carvedilol (COREG) 25 MG tablet   fenofibrate micronized (LOFIBRA) 134 MG capsule   losartan-hydrochlorothiazide (HYZAAR) 100-25 MG per tablet    Other Visit Diagnoses    PE (physical exam), annual        Relevant Orders    CBC with Differential/Platelet    Comprehensive metabolic panel    Urinalysis, Routine w reflex microscopic (not at  [facility])    TSH    PSA    Lipid panel        Follow up plan: Return in about 6 months (around 07/10/2015), or if symptoms worsen or fail to improve, for f/u meds.

## 2015-01-07 NOTE — Assessment & Plan Note (Signed)
The current medical regimen is effective;  continue present plan and medications.  

## 2015-01-07 NOTE — Assessment & Plan Note (Signed)
Stable but still drinking

## 2015-01-07 NOTE — Addendum Note (Signed)
Addended byGolden Pop on: 01/07/2015 02:45 PM   Modules accepted: Level of Service, SmartSet

## 2015-01-08 ENCOUNTER — Encounter: Payer: Self-pay | Admitting: Family Medicine

## 2015-01-08 LAB — COMPREHENSIVE METABOLIC PANEL
ALK PHOS: 55 IU/L (ref 39–117)
ALT: 10 IU/L (ref 0–44)
AST: 7 IU/L (ref 0–40)
Albumin/Globulin Ratio: 1.6 (ref 1.1–2.5)
Albumin: 4.2 g/dL (ref 3.6–4.8)
BILIRUBIN TOTAL: 0.7 mg/dL (ref 0.0–1.2)
BUN/Creatinine Ratio: 12 (ref 10–22)
BUN: 13 mg/dL (ref 8–27)
CALCIUM: 9.1 mg/dL (ref 8.6–10.2)
CHLORIDE: 100 mmol/L (ref 97–108)
CO2: 27 mmol/L (ref 18–29)
Creatinine, Ser: 1.06 mg/dL (ref 0.76–1.27)
GFR calc Af Amer: 82 mL/min/{1.73_m2} (ref >59)
GFR, EST NON AFRICAN AMERICAN: 71 mL/min/{1.73_m2} (ref >59)
Globulin, Total: 2.7 g/dL (ref 1.5–4.5)
Glucose: 104 mg/dL — ABNORMAL HIGH (ref 65–99)
Potassium: 4.6 mmol/L (ref 3.5–5.2)
SODIUM: 145 mmol/L — AB (ref 134–144)
Total Protein: 6.9 g/dL (ref 6.0–8.5)

## 2015-01-08 LAB — LIPID PANEL
CHOL/HDL RATIO: 7.2 ratio — AB (ref 0.0–5.0)
Cholesterol, Total: 172 mg/dL (ref 100–199)
HDL: 24 mg/dL — ABNORMAL LOW (ref >39)
Triglycerides: 722 mg/dL (ref 0–149)

## 2015-01-08 LAB — CBC WITH DIFFERENTIAL/PLATELET
BASOS: 1 %
Basophils Absolute: 0.1 10*3/uL (ref 0.0–0.2)
EOS (ABSOLUTE): 0.2 10*3/uL (ref 0.0–0.4)
EOS: 3 %
HEMATOCRIT: 36.8 % — AB (ref 37.5–51.0)
Hemoglobin: 12.3 g/dL — ABNORMAL LOW (ref 12.6–17.7)
IMMATURE GRANS (ABS): 0 10*3/uL (ref 0.0–0.1)
Immature Granulocytes: 0 %
LYMPHS ABS: 2.1 10*3/uL (ref 0.7–3.1)
Lymphs: 27 %
MCH: 32.3 pg (ref 26.6–33.0)
MCHC: 33.4 g/dL (ref 31.5–35.7)
MCV: 97 fL (ref 79–97)
MONOCYTES: 8 %
MONOS ABS: 0.6 10*3/uL (ref 0.1–0.9)
NEUTROS PCT: 61 %
Neutrophils Absolute: 4.6 10*3/uL (ref 1.4–7.0)
PLATELETS: 225 10*3/uL (ref 150–379)
RBC: 3.81 x10E6/uL — ABNORMAL LOW (ref 4.14–5.80)
RDW: 15.2 % (ref 12.3–15.4)
WBC: 7.5 10*3/uL (ref 3.4–10.8)

## 2015-01-08 LAB — TSH: TSH: 1.74 u[IU]/mL (ref 0.450–4.500)

## 2015-01-08 LAB — PSA: PROSTATE SPECIFIC AG, SERUM: 3.1 ng/mL (ref 0.0–4.0)

## 2015-07-10 ENCOUNTER — Ambulatory Visit: Payer: PPO | Admitting: Family Medicine

## 2015-08-07 ENCOUNTER — Encounter: Payer: Self-pay | Admitting: Family Medicine

## 2015-08-07 ENCOUNTER — Ambulatory Visit (INDEPENDENT_AMBULATORY_CARE_PROVIDER_SITE_OTHER): Payer: PPO | Admitting: Family Medicine

## 2015-08-07 VITALS — BP 128/82 | HR 64 | Temp 98.5°F | Ht 68.0 in | Wt 166.0 lb

## 2015-08-07 DIAGNOSIS — E7800 Pure hypercholesterolemia, unspecified: Secondary | ICD-10-CM

## 2015-08-07 DIAGNOSIS — F329 Major depressive disorder, single episode, unspecified: Secondary | ICD-10-CM

## 2015-08-07 DIAGNOSIS — H6123 Impacted cerumen, bilateral: Secondary | ICD-10-CM

## 2015-08-07 DIAGNOSIS — I10 Essential (primary) hypertension: Secondary | ICD-10-CM | POA: Diagnosis not present

## 2015-08-07 DIAGNOSIS — F32A Depression, unspecified: Secondary | ICD-10-CM

## 2015-08-07 MED ORDER — CARVEDILOL 25 MG PO TABS: 25.0000 mg | ORAL_TABLET | Freq: Two times a day (BID) | ORAL | Status: DC

## 2015-08-07 MED ORDER — SERTRALINE HCL 50 MG PO TABS: 50.0000 mg | ORAL_TABLET | Freq: Every day | ORAL | Status: DC

## 2015-08-07 MED ORDER — FENOFIBRATE MICRONIZED 134 MG PO CAPS: 134.0000 mg | ORAL_CAPSULE | Freq: Every day | ORAL | Status: DC

## 2015-08-07 MED ORDER — LOSARTAN POTASSIUM-HCTZ 100-25 MG PO TABS: 0.5000 | ORAL_TABLET | Freq: Every day | ORAL | Status: DC

## 2015-08-07 NOTE — Assessment & Plan Note (Signed)
Cerumen impaction both ears with decreased hearing and blocked up sensation removed with water revealing normal canals and TMs also instruments were used to remove wax. Asian tolerated the procedure well

## 2015-08-07 NOTE — Progress Notes (Signed)
BP 128/82 mmHg  Pulse 64  Temp(Src) 98.5 F (36.9 C)  Ht 5\' 8"  (1.727 m)  Wt 166 lb (75.297 kg)  BMI 25.25 kg/m2  SpO2 97%   Subjective:    Patient ID: Carl Allen, male    DOB: 1945/11/18, 70 y.o.   MRN: [id number]  HPI: Carl Allen is a 70 y.o. male  Chief Complaint  Patient presents with  . Hypertension  . Hyperlipidemia  . Depression  . ear clogged    hearing diminished    Relevant past medical, surgical, family and social history reviewed and updated as indicated. Interim medical history since our last visit reviewed. Allergies and medications reviewed and updated.  Review of Systems  Constitutional: Negative.   Respiratory: Negative.   Cardiovascular: Negative.     Per HPI unless specifically indicated above     Objective:    BP 128/82 mmHg  Pulse 64  Temp(Src) 98.5 F (36.9 C)  Ht 5\' 8"  (1.727 m)  Wt 166 lb (75.297 kg)  BMI 25.25 kg/m2  SpO2 97%  Wt Readings from Last 3 Encounters:  08/07/15 166 lb (75.297 kg)  01/07/15 162 lb (73.483 kg)  11/15/14 161 lb 9.6 oz (73.301 kg)    Physical Exam  Constitutional: He is oriented to person, place, and time. He appears well-developed and well-nourished. No distress.  HENT:  Head: Normocephalic and atraumatic.  Right Ear: Hearing and external ear normal.  Left Ear: Hearing and external ear normal.  Nose: Nose normal.  see cerumen removal note below  Eyes: Conjunctivae and lids are normal. Right eye exhibits no discharge. Left eye exhibits no discharge. No scleral icterus.  Cardiovascular: Normal rate, regular rhythm and normal heart sounds.   Pulmonary/Chest: Effort normal and breath sounds normal. No respiratory distress.  Musculoskeletal: Normal range of motion.  Neurological: He is alert and oriented to person, place, and time.  Skin: Skin is intact. No rash noted.  Psychiatric: He has a normal mood and affect. His speech is normal and behavior is normal. Judgment and thought content normal.  Cognition and memory are normal.    Results for orders placed or performed in visit on 01/07/15  Microscopic Examination  Result Value Ref Range   WBC, UA 0-5 0 -  5 /hpf   RBC, UA 0-2 0 -  2 /hpf   Epithelial Cells (non renal) 0-10 0 - 10 /hpf   Bacteria, UA Few None seen/Few  CBC with Differential/Platelet  Result Value Ref Range   WBC 7.5 3.4 - 10.8 x10E3/uL   RBC 3.81 (L) 4.14 - 5.80 x10E6/uL   Hemoglobin 12.3 (L) 12.6 - 17.7 g/dL   Hematocrit 36.8 (L) 37.5 - 51.0 %   MCV 97 79 - 97 fL   MCH 32.3 26.6 - 33.0 pg   MCHC 33.4 31.5 - 35.7 g/dL   RDW 15.2 12.3 - 15.4 %   Platelets 225 150 - 379 x10E3/uL   Neutrophils 61 %   Lymphs 27 %   Monocytes 8 %   Eos 3 %   Basos 1 %   Neutrophils Absolute 4.6 1.4 - 7.0 x10E3/uL   Lymphocytes Absolute 2.1 0.7 - 3.1 x10E3/uL   Monocytes Absolute 0.6 0.1 - 0.9 x10E3/uL   EOS (ABSOLUTE) 0.2 0.0 - 0.4 x10E3/uL   Basophils Absolute 0.1 0.0 - 0.2 x10E3/uL   Immature Granulocytes 0 %   Immature Grans (Abs) 0.0 0.0 - 0.1 x10E3/uL  Comprehensive metabolic panel  Result Value Ref Range  Glucose 104 (H) 65 - 99 mg/dL   BUN 13 8 - 27 mg/dL   Creatinine, Ser 1.06 0.76 - 1.27 mg/dL   GFR calc non Af Amer 71 >59 mL/min/1.73   GFR calc Af Amer 82 >59 mL/min/1.73   BUN/Creatinine Ratio 12 10 - 22   Sodium 145 (H) 134 - 144 mmol/L   Potassium 4.6 3.5 - 5.2 mmol/L   Chloride 100 97 - 108 mmol/L   CO2 27 18 - 29 mmol/L   Calcium 9.1 8.6 - 10.2 mg/dL   Total Protein 6.9 6.0 - 8.5 g/dL   Albumin 4.2 3.6 - 4.8 g/dL   Globulin, Total 2.7 1.5 - 4.5 g/dL   Albumin/Globulin Ratio 1.6 1.1 - 2.5   Bilirubin Total 0.7 0.0 - 1.2 mg/dL   Alkaline Phosphatase 55 39 - 117 IU/L   AST 7 0 - 40 IU/L   ALT 10 0 - 44 IU/L  Urinalysis, Routine w reflex microscopic (not at [facility])  Result Value Ref Range   Specific Gravity, UA 1.025 1.005 - 1.030   pH, UA 6.0 5.0 - 7.5   Color, UA Yellow Yellow   Appearance Ur Clear Clear   Leukocytes, UA Negative Negative    Protein, UA Negative Negative/Trace   Glucose, UA Negative Negative   Ketones, UA Negative Negative   RBC, UA Trace (A) Negative   Bilirubin, UA Negative Negative   Urobilinogen, Ur 1.0 0.2 - 1.0 mg/dL   Nitrite, UA Negative Negative   Microscopic Examination See below:   TSH  Result Value Ref Range   TSH 1.740 0.450 - 4.500 uIU/mL  PSA  Result Value Ref Range   Prostate Specific Ag, Serum 3.1 0.0 - 4.0 ng/mL  Lipid panel  Result Value Ref Range   Cholesterol, Total 172 100 - 199 mg/dL   Triglycerides 722 (HH) 0 - 149 mg/dL   HDL 24 (L) >39 mg/dL   VLDL Cholesterol Cal Comment 5 - 40 mg/dL   LDL Calculated Comment 0 - 99 mg/dL   Chol/HDL Ratio 7.2 (H) 0.0 - 5.0 ratio units      Assessment & Plan:   Problem List Items Addressed This Visit      Cardiovascular and Mediastinum   Essential hypertension   Relevant Medications   losartan-hydrochlorothiazide (HYZAAR) 100-25 MG tablet   fenofibrate micronized (LOFIBRA) 134 MG capsule   carvedilol (COREG) 25 MG tablet   Other Relevant Orders   Basic metabolic panel     Nervous and Auditory   Cerumen impaction    Cerumen impaction both ears with decreased hearing and blocked up sensation removed with water revealing normal canals and TMs also instruments were used to remove wax. Asian tolerated the procedure well        Other   Hypercholesteremia - Primary   Relevant Medications   losartan-hydrochlorothiazide (HYZAAR) 100-25 MG tablet   fenofibrate micronized (LOFIBRA) 134 MG capsule   carvedilol (COREG) 25 MG tablet   Other Relevant Orders   Basic metabolic panel   Lipid Panel w/o Chol/HDL Ratio   AST   ALT   Depression   Relevant Medications   sertraline (ZOLOFT) 50 MG tablet       Follow up plan: Return in about 6 months (around 02/07/2016) for Physical Exam.

## 2015-08-08 ENCOUNTER — Encounter: Payer: Self-pay | Admitting: Family Medicine

## 2015-08-08 LAB — BASIC METABOLIC PANEL
BUN/Creatinine Ratio: 18 (ref 10–22)
BUN: 16 mg/dL (ref 8–27)
CALCIUM: 9.2 mg/dL (ref 8.6–10.2)
CHLORIDE: 102 mmol/L (ref 96–106)
CO2: 26 mmol/L (ref 18–29)
Creatinine, Ser: 0.87 mg/dL (ref 0.76–1.27)
GFR calc non Af Amer: 88 mL/min/{1.73_m2} (ref >59)
GFR, EST AFRICAN AMERICAN: 102 mL/min/{1.73_m2} (ref >59)
GLUCOSE: 88 mg/dL (ref 65–99)
POTASSIUM: 4.3 mmol/L (ref 3.5–5.2)
Sodium: 144 mmol/L (ref 134–144)

## 2015-08-08 LAB — LIPID PANEL W/O CHOL/HDL RATIO
CHOLESTEROL TOTAL: 154 mg/dL (ref 100–199)
HDL: 28 mg/dL — AB (ref >39)
TRIGLYCERIDES: 442 mg/dL — AB (ref 0–149)

## 2015-08-08 LAB — ALT: ALT: 8 IU/L (ref 0–44)

## 2015-08-08 LAB — AST: AST: 15 IU/L (ref 0–40)

## 2016-01-08 ENCOUNTER — Encounter: Payer: Self-pay | Admitting: Family Medicine

## 2016-01-08 ENCOUNTER — Ambulatory Visit (INDEPENDENT_AMBULATORY_CARE_PROVIDER_SITE_OTHER): Payer: PPO | Admitting: Family Medicine

## 2016-01-08 VITALS — BP 133/80 | HR 58 | Temp 97.7°F | Ht 67.6 in | Wt 168.0 lb

## 2016-01-08 DIAGNOSIS — L309 Dermatitis, unspecified: Secondary | ICD-10-CM

## 2016-01-08 DIAGNOSIS — J410 Simple chronic bronchitis: Secondary | ICD-10-CM | POA: Diagnosis not present

## 2016-01-08 DIAGNOSIS — Z23 Encounter for immunization: Secondary | ICD-10-CM | POA: Diagnosis not present

## 2016-01-08 DIAGNOSIS — E7800 Pure hypercholesterolemia, unspecified: Secondary | ICD-10-CM

## 2016-01-08 DIAGNOSIS — Z0000 Encounter for general adult medical examination without abnormal findings: Secondary | ICD-10-CM | POA: Diagnosis not present

## 2016-01-08 DIAGNOSIS — I10 Essential (primary) hypertension: Secondary | ICD-10-CM

## 2016-01-08 DIAGNOSIS — F32A Depression, unspecified: Secondary | ICD-10-CM

## 2016-01-08 DIAGNOSIS — F329 Major depressive disorder, single episode, unspecified: Secondary | ICD-10-CM | POA: Diagnosis not present

## 2016-01-08 LAB — URINALYSIS, ROUTINE W REFLEX MICROSCOPIC
Bilirubin, UA: NEGATIVE
Glucose, UA: NEGATIVE
KETONES UA: NEGATIVE
LEUKOCYTES UA: NEGATIVE
Nitrite, UA: NEGATIVE
PROTEIN UA: NEGATIVE
SPEC GRAV UA: 1.01 (ref 1.005–1.030)
Urobilinogen, Ur: 0.2 mg/dL (ref 0.2–1.0)
pH, UA: 6.5 (ref 5.0–7.5)

## 2016-01-08 LAB — MICROSCOPIC EXAMINATION

## 2016-01-08 MED ORDER — TIOTROPIUM BROMIDE MONOHYDRATE 18 MCG IN CAPS: 18.0000 ug | ORAL_CAPSULE | Freq: Every morning | RESPIRATORY_TRACT | 4 refills | Status: DC

## 2016-01-08 MED ORDER — CARVEDILOL 25 MG PO TABS: 25.0000 mg | ORAL_TABLET | Freq: Two times a day (BID) | ORAL | 4 refills | Status: DC

## 2016-01-08 MED ORDER — FENOFIBRATE MICRONIZED 134 MG PO CAPS: 134.0000 mg | ORAL_CAPSULE | Freq: Every day | ORAL | 4 refills | Status: DC

## 2016-01-08 MED ORDER — LOSARTAN POTASSIUM-HCTZ 100-25 MG PO TABS: 0.5000 | ORAL_TABLET | Freq: Every day | ORAL | 4 refills | Status: DC

## 2016-01-08 MED ORDER — FLUTICASONE FUROATE-VILANTEROL 100-25 MCG/INH IN AEPB: 1.0000 | INHALATION_SPRAY | Freq: Every morning | RESPIRATORY_TRACT | 4 refills | Status: DC

## 2016-01-08 NOTE — Assessment & Plan Note (Signed)
The current medical regimen is effective;  continue present plan and medications.  

## 2016-01-08 NOTE — Assessment & Plan Note (Signed)
Relatively well controlled now has flaking on his left lower leg treated with creams and lotions and seems to be better.

## 2016-01-08 NOTE — Patient Instructions (Addendum)
Tdap Vaccine (Tetanus, Diphtheria and Pertussis): What You Need to Know 1. Why get vaccinated? Tetanus, diphtheria and pertussis are very serious diseases. Tdap vaccine can protect us from these diseases. And, Tdap vaccine given to pregnant women can protect newborn babies against pertussis. TETANUS (Lockjaw) is rare in the [place] today. It causes painful muscle tightening and stiffness, usually all over the body.  It can lead to tightening of muscles in the head and neck so you can't open your mouth, swallow, or sometimes even breathe. Tetanus kills about 1 out of 10 people who are infected even after receiving the best medical care. DIPHTHERIA is also rare in the [place] today. It can cause a thick coating to form in the back of the throat.  It can lead to breathing problems, heart failure, paralysis, and death. PERTUSSIS (Whooping Cough) causes severe coughing spells, which can cause difficulty breathing, vomiting and disturbed sleep.  It can also lead to weight loss, incontinence, and rib fractures. Up to 2 in 100 adolescents and 5 in 100 adults with pertussis are hospitalized or have complications, which could include pneumonia or death. These diseases are caused by bacteria. Diphtheria and pertussis are spread from person to person through secretions from coughing or sneezing. Tetanus enters the body through cuts, scratches, or wounds. Before vaccines, as many as 200,000 cases of diphtheria, 200,000 cases of pertussis, and hundreds of cases of tetanus, were reported in the [place] each year. Since vaccination began, reports of cases for tetanus and diphtheria have dropped by about 99% and for pertussis by about 80%. 2. Tdap vaccine Tdap vaccine can protect adolescents and adults from tetanus, diphtheria, and pertussis. One dose of Tdap is routinely given at age 11 or 12. People who did not get Tdap at that age should get it as soon as possible. Tdap is especially important  for healthcare professionals and anyone having close contact with a baby younger than 12 months. Pregnant women should get a dose of Tdap during every pregnancy, to protect the newborn from pertussis. Infants are most at risk for severe, life-threatening complications from pertussis. Another vaccine, called Td, protects against tetanus and diphtheria, but not pertussis. A Td booster should be given every 10 years. Tdap may be given as one of these boosters if you have never gotten Tdap before. Tdap may also be given after a severe cut or burn to prevent tetanus infection. Your doctor or the person giving you the vaccine can give you more information. Tdap may safely be given at the same time as other vaccines. 3. Some people should not get this vaccine  A person who has ever had a life-threatening allergic reaction after a previous dose of any diphtheria, tetanus or pertussis containing vaccine, OR has a severe allergy to any part of this vaccine, should not get Tdap vaccine. Tell the person giving the vaccine about any severe allergies.  Anyone who had coma or long repeated seizures within 7 days after a childhood dose of DTP or DTaP, or a previous dose of Tdap, should not get Tdap, unless a cause other than the vaccine was found. They can still get Td.  Talk to your doctor if you:  have seizures or another nervous system problem,  had severe pain or swelling after any vaccine containing diphtheria, tetanus or pertussis,  ever had a condition called Guillain-Barr Syndrome (GBS),  aren't feeling well on the day the shot is scheduled. 4. Risks With any medicine, including vaccines, there is   a chance of side effects. These are usually mild and go away on their own. Serious reactions are also possible but are rare. Most people who get Tdap vaccine do not have any problems with it. Mild problems following Tdap (Did not interfere with activities)  Pain where the shot was given (about 3 in 4  adolescents or 2 in 3 adults)  Redness or swelling where the shot was given (about 1 person in 5)  Mild fever of at least 100.4F (up to about 1 in 25 adolescents or 1 in 100 adults)  Headache (about 3 or 4 people in 10)  Tiredness (about 1 person in 3 or 4)  Nausea, vomiting, diarrhea, stomach ache (up to 1 in 4 adolescents or 1 in 10 adults)  Chills, sore joints (about 1 person in 10)  Body aches (about 1 person in 3 or 4)  Rash, swollen glands (uncommon) Moderate problems following Tdap (Interfered with activities, but did not require medical attention)  Pain where the shot was given (up to 1 in 5 or 6)  Redness or swelling where the shot was given (up to about 1 in 16 adolescents or 1 in 12 adults)  Fever over 102F (about 1 in 100 adolescents or 1 in 250 adults)  Headache (about 1 in 7 adolescents or 1 in 10 adults)  Nausea, vomiting, diarrhea, stomach ache (up to 1 or 3 people in 100)  Swelling of the entire arm where the shot was given (up to about 1 in 500). Severe problems following Tdap (Unable to perform usual activities; required medical attention)  Swelling, severe pain, bleeding and redness in the arm where the shot was given (rare). Problems that could happen after any vaccine:  People sometimes faint after a medical procedure, including vaccination. Sitting or lying down for about 15 minutes can help prevent fainting, and injuries caused by a fall. Tell your doctor if you feel dizzy, or have vision changes or ringing in the ears.  Some people get severe pain in the shoulder and have difficulty moving the arm where a shot was given. This happens very rarely.  Any medication can cause a severe allergic reaction. Such reactions from a vaccine are very rare, estimated at fewer than 1 in a million doses, and would happen within a few minutes to a few hours after the vaccination. As with any medicine, there is a very remote chance of a vaccine causing a serious  injury or death. The safety of vaccines is always being monitored. For more information, visit: www.cdc.gov/vaccinesafety/ 5. What if there is a serious problem? What should I look for?  Look for anything that concerns you, such as signs of a severe allergic reaction, very high fever, or unusual behavior.  Signs of a severe allergic reaction can include hives, swelling of the face and throat, difficulty breathing, a fast heartbeat, dizziness, and weakness. These would usually start a few minutes to a few hours after the vaccination. What should I do?  If you think it is a severe allergic reaction or other emergency that can't wait, call 9-1-1 or get the person to the nearest hospital. Otherwise, call your doctor.  Afterward, the reaction should be reported to the Vaccine Adverse Event Reporting System (VAERS). Your doctor might file this report, or you can do it yourself through the VAERS web site at www.vaers.hhs.gov, or by calling 1-800-822-7967. VAERS does not give medical advice.  6. The National Vaccine Injury Compensation Program The National Vaccine Injury Compensation Program (  VICP) is a federal program that was created to compensate people who may have been injured by certain vaccines. Persons who believe they may have been injured by a vaccine can learn about the program and about filing a claim by calling 1-800-338-2382 or visiting the VICP website at www.hrsa.gov/vaccinecompensation. There is a time limit to file a claim for compensation. 7. How can I learn more?  Ask your doctor. He or she can give you the vaccine package insert or suggest other sources of information.  Call your local or state health department.  Contact the Centers for Disease Control and Prevention (CDC):  Call 1-800-232-4636 (1-800-CDC-INFO) or  Visit CDC's website at www.cdc.gov/vaccines CDC Tdap Vaccine VIS (07/25/13)   This information is not intended to replace advice given to you by your health care  provider. Make sure you discuss any questions you have with your health care provider.   Document Released: 11/17/2011 Document Revised: 06/08/2014 Document Reviewed: 08/30/2013 Elsevier Interactive Patient Education 2016 Elsevier Inc.  

## 2016-01-08 NOTE — Progress Notes (Signed)
BP 133/80 (BP Location: Left Arm, Patient Position: Sitting, Cuff Size: Normal)   Pulse (!) 58   Temp 97.7 F (36.5 C)   Ht 5' 7.6" (1.717 m)   Wt 168 lb (76.2 kg)   SpO2 98%   BMI 25.85 kg/m    Subjective:    Patient ID: Carl Allen, male    DOB: 1945/09/17, 70 y.o.   MRN: [id number]  HPI: Carl Allen is a 70 y.o. male  Chief Complaint  Patient presents with  . Annual Exam  Blood pressure doing well [place]. No complaints from medications doing well Alcoholism is been well controlled Continues to smoke but not in the house Reflux doing well Depression doing much better stopped Zoloft earlier this summer and is done well continue to sleep well with good energy Cholesterol doing well on medications no complaints  Relevant past medical, surgical, family and social history reviewed and updated as indicated. Interim medical history since our last visit reviewed. Allergies and medications reviewed and updated.  Review of Systems  Constitutional: Negative.   HENT: Negative.   Eyes: Negative.   Respiratory: Negative.   Cardiovascular: Negative.   Gastrointestinal: Negative.   Endocrine: Negative.   Genitourinary: Negative.   Musculoskeletal: Negative.   Skin: Negative.   Allergic/Immunologic: Negative.   Neurological: Negative.   Hematological: Negative.   Psychiatric/Behavioral: Negative.     Per HPI unless specifically indicated above     Objective:    BP 133/80 (BP Location: Left Arm, Patient Position: Sitting, Cuff Size: Normal)   Pulse (!) 58   Temp 97.7 F (36.5 C)   Ht 5' 7.6" (1.717 m)   Wt 168 lb (76.2 kg)   SpO2 98%   BMI 25.85 kg/m   Wt Readings from Last 3 Encounters:  01/08/16 168 lb (76.2 kg)  08/07/15 166 lb (75.3 kg)  01/07/15 162 lb (73.5 kg)    Physical Exam  Constitutional: He is oriented to person, place, and time. He appears well-developed and well-nourished.  HENT:  Head:  Normocephalic and atraumatic.  Right Ear: External ear normal.  Left Ear: External ear normal.  Eyes: Conjunctivae and EOM are normal. Pupils are equal, round, and reactive to light.  Neck: Normal range of motion. Neck supple.  Cardiovascular: Normal rate, regular rhythm, normal heart sounds and intact distal pulses.   Pulmonary/Chest: Effort normal and breath sounds normal.  Abdominal: Soft. Bowel sounds are normal. There is no splenomegaly or hepatomegaly.  Genitourinary: Rectum normal and penis normal.  Genitourinary Comments: Prostate enlarged  Musculoskeletal: Normal range of motion.  Neurological: He is alert and oriented to person, place, and time. He has normal reflexes.  Skin: No rash noted. No erythema.  Psychiatric: He has a normal mood and affect. His behavior is normal. Judgment and thought content normal.    Results for orders placed or performed in visit on [id number]  Basic metabolic panel  Result Value Ref Range   Glucose 88 65 - 99 mg/dL   BUN 16 8 - 27 mg/dL   Creatinine, Ser 0.87 0.76 - 1.27 mg/dL   GFR calc non Af Amer 88 >59 mL/min/1.73   GFR calc Af Amer 102 >59 mL/min/1.73   BUN/Creatinine Ratio 18 10 - 22   Sodium 144 134 - 144 mmol/L   Potassium 4.3 3.5 - 5.2 mmol/L   Chloride 102 96 - 106 mmol/L   CO2 26 18 - 29 mmol/L  Calcium 9.2 8.6 - 10.2 mg/dL  Lipid Panel w/o Chol/HDL Ratio  Result Value Ref Range   Cholesterol, Total 154 100 - 199 mg/dL   Triglycerides 442 (H) 0 - 149 mg/dL   HDL 28 (L) >39 mg/dL   VLDL Cholesterol Cal Comment 5 - 40 mg/dL   LDL Calculated Comment 0 - 99 mg/dL  AST  Result Value Ref Range   AST 15 0 - 40 IU/L  ALT  Result Value Ref Range   ALT 8 0 - 44 IU/L      Assessment & Plan:   Problem List Items Addressed This Visit      Cardiovascular and Mediastinum   Essential hypertension    The current medical regimen is effective;  continue present plan and medications.       Relevant Medications    losartan-hydrochlorothiazide (HYZAAR) 100-25 MG tablet   fenofibrate micronized (LOFIBRA) 134 MG capsule   carvedilol (COREG) 25 MG tablet     Respiratory   COPD (chronic obstructive pulmonary disease) (HCC)    The current medical regimen is effective;  continue present plan and medications.       Relevant Medications   tiotropium (SPIRIVA HANDIHALER) 18 MCG inhalation capsule   fluticasone furoate-vilanterol (BREO ELLIPTA) 100-25 MCG/INH AEPB     Musculoskeletal and Integument   Eczema    Relatively well controlled now has flaking on his left lower leg treated with creams and lotions and seems to be better.        Other   Depression    Resolved and now off medications      Hypercholesteremia    The current medical regimen is effective;  continue present plan and medications.       Relevant Medications   losartan-hydrochlorothiazide (HYZAAR) 100-25 MG tablet   fenofibrate micronized (LOFIBRA) 134 MG capsule   carvedilol (COREG) 25 MG tablet    Other Visit Diagnoses    Well adult exam    -  Primary   Relevant Orders   Urinalysis, Routine w reflex microscopic (not at [facility])   CBC with Differential/Platelet   Comprehensive metabolic panel   Lipid Panel w/o Chol/HDL Ratio   PSA   TSH   Healthcare maintenance       Relevant Orders   Hepatitis C Antibody   Need for Tdap vaccination       Relevant Orders   Tdap vaccine greater than or equal to 7yo IM (Completed)       Follow up plan: Return in about 6 months (around 07/10/2016) for BMP , Lipids, ALT, AST.

## 2016-01-08 NOTE — Assessment & Plan Note (Signed)
Resolved and now off medications

## 2016-01-09 ENCOUNTER — Telehealth: Payer: Self-pay | Admitting: Family Medicine

## 2016-01-09 DIAGNOSIS — R972 Elevated prostate specific antigen [PSA]: Secondary | ICD-10-CM

## 2016-01-09 LAB — CBC WITH DIFFERENTIAL/PLATELET
BASOS ABS: 0 10*3/uL (ref 0.0–0.2)
Basos: 1 %
EOS (ABSOLUTE): 0.2 10*3/uL (ref 0.0–0.4)
Eos: 3 %
HEMOGLOBIN: 13.8 g/dL (ref 12.6–17.7)
Hematocrit: 42.7 % (ref 37.5–51.0)
IMMATURE GRANS (ABS): 0 10*3/uL (ref 0.0–0.1)
IMMATURE GRANULOCYTES: 0 %
LYMPHS: 27 %
Lymphocytes Absolute: 1.9 10*3/uL (ref 0.7–3.1)
MCH: 30.1 pg (ref 26.6–33.0)
MCHC: 32.3 g/dL (ref 31.5–35.7)
MCV: 93 fL (ref 79–97)
Monocytes Absolute: 0.6 10*3/uL (ref 0.1–0.9)
Monocytes: 8 %
NEUTROS ABS: 4.4 10*3/uL (ref 1.4–7.0)
Neutrophils: 61 %
PLATELETS: 192 10*3/uL (ref 150–379)
RBC: 4.59 x10E6/uL (ref 4.14–5.80)
RDW: 14.4 % (ref 12.3–15.4)
WBC: 7.2 10*3/uL (ref 3.4–10.8)

## 2016-01-09 LAB — TSH: TSH: 2.12 u[IU]/mL (ref 0.450–4.500)

## 2016-01-09 LAB — COMPREHENSIVE METABOLIC PANEL
ALBUMIN: 4.3 g/dL (ref 3.5–4.8)
ALT: 8 IU/L (ref 0–44)
AST: 13 IU/L (ref 0–40)
Albumin/Globulin Ratio: 1.7 (ref 1.2–2.2)
Alkaline Phosphatase: 50 IU/L (ref 39–117)
BUN/Creatinine Ratio: 22 (ref 10–24)
BUN: 20 mg/dL (ref 8–27)
Bilirubin Total: 0.6 mg/dL (ref 0.0–1.2)
CALCIUM: 9.5 mg/dL (ref 8.6–10.2)
CHLORIDE: 101 mmol/L (ref 96–106)
CO2: 26 mmol/L (ref 18–29)
CREATININE: 0.92 mg/dL (ref 0.76–1.27)
GFR, EST AFRICAN AMERICAN: 97 mL/min/{1.73_m2} (ref >59)
GFR, EST NON AFRICAN AMERICAN: 84 mL/min/{1.73_m2} (ref >59)
GLUCOSE: 107 mg/dL — AB (ref 65–99)
Globulin, Total: 2.6 g/dL (ref 1.5–4.5)
Potassium: 4.6 mmol/L (ref 3.5–5.2)
Sodium: 141 mmol/L (ref 134–144)
TOTAL PROTEIN: 6.9 g/dL (ref 6.0–8.5)

## 2016-01-09 LAB — HEPATITIS C ANTIBODY: Hep C Virus Ab: <0.1 s/co ratio (ref 0.0–0.9)

## 2016-01-09 LAB — LIPID PANEL W/O CHOL/HDL RATIO
Cholesterol, Total: 172 mg/dL (ref 100–199)
HDL: 30 mg/dL — AB (ref >39)
LDL CALC: 100 mg/dL — AB (ref 0–99)
Triglycerides: 208 mg/dL — ABNORMAL HIGH (ref 0–149)
VLDL CHOLESTEROL CAL: 42 mg/dL — AB (ref 5–40)

## 2016-01-09 LAB — PSA: PROSTATE SPECIFIC AG, SERUM: 4 ng/mL (ref 0.0–4.0)

## 2016-01-09 NOTE — Telephone Encounter (Signed)
Phone call Discussed with patient PSA a year ago 3.1 yesterday 4. We'll recheck PSA 2-3 months

## 2016-07-14 ENCOUNTER — Encounter: Payer: Self-pay | Admitting: Family Medicine

## 2016-07-14 ENCOUNTER — Ambulatory Visit (INDEPENDENT_AMBULATORY_CARE_PROVIDER_SITE_OTHER): Payer: PPO | Admitting: Family Medicine

## 2016-07-14 VITALS — BP 136/80 | HR 60 | Ht 68.0 in | Wt 169.5 lb

## 2016-07-14 DIAGNOSIS — I10 Essential (primary) hypertension: Secondary | ICD-10-CM | POA: Diagnosis not present

## 2016-07-14 DIAGNOSIS — J410 Simple chronic bronchitis: Secondary | ICD-10-CM

## 2016-07-14 DIAGNOSIS — F1020 Alcohol dependence, uncomplicated: Secondary | ICD-10-CM | POA: Insufficient documentation

## 2016-07-14 DIAGNOSIS — E7800 Pure hypercholesterolemia, unspecified: Secondary | ICD-10-CM

## 2016-07-14 LAB — LP+ALT+AST PICCOLO, WAIVED
ALT (SGPT) Piccolo, Waived: 11 U/L (ref 10–47)
AST (SGOT) Piccolo, Waived: 17 U/L (ref 11–38)
CHOLESTEROL PICCOLO, WAIVED: 207 mg/dL — AB (ref <200)
Chol/HDL Ratio Piccolo,Waive: 5.7 mg/dL — ABNORMAL HIGH
HDL Chol Piccolo, Waived: 37 mg/dL — ABNORMAL LOW (ref >59)
LDL Chol Calc Piccolo Waived: 121 mg/dL — ABNORMAL HIGH (ref <100)
Triglycerides Piccolo,Waived: 246 mg/dL — ABNORMAL HIGH (ref <150)
VLDL CHOL CALC PICCOLO,WAIVE: 49 mg/dL — AB (ref <30)

## 2016-07-14 NOTE — Assessment & Plan Note (Signed)
The current medical regimen is effective;  continue present plan and medications.  

## 2016-07-14 NOTE — Assessment & Plan Note (Signed)
Resolved and stable

## 2016-07-14 NOTE — Progress Notes (Signed)
BP 136/80 (BP Location: Right Arm)   Pulse 60   Ht 5\' 8"  (1.727 m)   Wt 169 lb 8 oz (76.9 kg)   SpO2 99%   BMI 25.77 kg/m    Subjective:    Patient ID: Carl Allen, male    DOB: 06/26/1945, 71 y.o.   MRN: [id number]  HPI: Carl Allen is a 71 y.o. male  Chief Complaint  Patient presents with  . Follow-up  . Hypertension  . Hyperlipidemia   Patient follow-up blood pressure doing well no complaints home blood pressure monitoring showing good control no issues with medications. Cholesterol doing better and triglycerides doing better eating better and feeling much better Alcohol has just about stopped only does occasional social light drinking and is really turned his life around made a tremendous difference and feeling better and is cleaned up. Relevant past medical, surgical, family and social history reviewed and updated as indicated. Interim medical history since our last visit reviewed. Allergies and medications reviewed and updated.  Review of Systems  Constitutional: Negative.   Respiratory: Negative.   Cardiovascular: Negative.     Per HPI unless specifically indicated above     Objective:    BP 136/80 (BP Location: Right Arm)   Pulse 60   Ht 5\' 8"  (1.727 m)   Wt 169 lb 8 oz (76.9 kg)   SpO2 99%   BMI 25.77 kg/m   Wt Readings from Last 3 Encounters:  07/14/16 169 lb 8 oz (76.9 kg)  01/08/16 168 lb (76.2 kg)  08/07/15 166 lb (75.3 kg)    Physical Exam  Constitutional: He is oriented to person, place, and time. He appears well-developed and well-nourished. No distress.  HENT:  Head: Normocephalic and atraumatic.  Right Ear: Hearing normal.  Left Ear: Hearing normal.  Nose: Nose normal.  Eyes: Conjunctivae and lids are normal. Right eye exhibits no discharge. Left eye exhibits no discharge. No scleral icterus.  Cardiovascular: Normal rate, regular rhythm and normal heart sounds.   Pulmonary/Chest: Effort normal and breath sounds normal. No  respiratory distress.  Musculoskeletal: Normal range of motion.  Neurological: He is alert and oriented to person, place, and time.  Skin: Skin is intact. No rash noted.  Psychiatric: He has a normal mood and affect. His speech is normal and behavior is normal. Judgment and thought content normal. Cognition and memory are normal.    Results for orders placed or performed in visit on 01/08/16  Microscopic Examination  Result Value Ref Range   WBC, UA 0-5 0 - 5 /hpf   RBC, UA 0-2 0 - 2 /hpf   Epithelial Cells (non renal) 0-10 0 - 10 /hpf   Bacteria, UA Few None seen/Few  Urinalysis, Routine w reflex microscopic (not at [facility])  Result Value Ref Range   Specific Gravity, UA 1.010 1.005 - 1.030   pH, UA 6.5 5.0 - 7.5   Color, UA Yellow Yellow   Appearance Ur Clear Clear   Leukocytes, UA Negative Negative   Protein, UA Negative Negative/Trace   Glucose, UA Negative Negative   Ketones, UA Negative Negative   RBC, UA Trace (A) Negative   Bilirubin, UA Negative Negative   Urobilinogen, Ur 0.2 0.2 - 1.0 mg/dL   Nitrite, UA Negative Negative   Microscopic Examination See below:   CBC with Differential/Platelet  Result Value Ref Range   WBC 7.2 3.4 - 10.8 x10E3/uL   RBC 4.59 4.14 - 5.80 x10E6/uL   Hemoglobin 13.8 12.6 -  17.7 g/dL   Hematocrit 42.7 37.5 - 51.0 %   MCV 93 79 - 97 fL   MCH 30.1 26.6 - 33.0 pg   MCHC 32.3 31.5 - 35.7 g/dL   RDW 14.4 12.3 - 15.4 %   Platelets 192 150 - 379 x10E3/uL   Neutrophils 61 %   Lymphs 27 %   Monocytes 8 %   Eos 3 %   Basos 1 %   Neutrophils Absolute 4.4 1.4 - 7.0 x10E3/uL   Lymphocytes Absolute 1.9 0.7 - 3.1 x10E3/uL   Monocytes Absolute 0.6 0.1 - 0.9 x10E3/uL   EOS (ABSOLUTE) 0.2 0.0 - 0.4 x10E3/uL   Basophils Absolute 0.0 0.0 - 0.2 x10E3/uL   Immature Granulocytes 0 %   Immature Grans (Abs) 0.0 0.0 - 0.1 x10E3/uL  Comprehensive metabolic panel  Result Value Ref Range   Glucose 107 (H) 65 - 99 mg/dL   BUN 20 8 - 27 mg/dL   Creatinine,  Ser 0.92 0.76 - 1.27 mg/dL   GFR calc non Af Amer 84 >59 mL/min/1.73   GFR calc Af Amer 97 >59 mL/min/1.73   BUN/Creatinine Ratio 22 10 - 24   Sodium 141 134 - 144 mmol/L   Potassium 4.6 3.5 - 5.2 mmol/L   Chloride 101 96 - 106 mmol/L   CO2 26 18 - 29 mmol/L   Calcium 9.5 8.6 - 10.2 mg/dL   Total Protein 6.9 6.0 - 8.5 g/dL   Albumin 4.3 3.5 - 4.8 g/dL   Globulin, Total 2.6 1.5 - 4.5 g/dL   Albumin/Globulin Ratio 1.7 1.2 - 2.2   Bilirubin Total 0.6 0.0 - 1.2 mg/dL   Alkaline Phosphatase 50 39 - 117 IU/L   AST 13 0 - 40 IU/L   ALT 8 0 - 44 IU/L  Lipid Panel w/o Chol/HDL Ratio  Result Value Ref Range   Cholesterol, Total 172 100 - 199 mg/dL   Triglycerides 208 (H) 0 - 149 mg/dL   HDL 30 (L) >39 mg/dL   VLDL Cholesterol Cal 42 (H) 5 - 40 mg/dL   LDL Calculated 100 (H) 0 - 99 mg/dL  PSA  Result Value Ref Range   Prostate Specific Ag, Serum 4.0 0.0 - 4.0 ng/mL  TSH  Result Value Ref Range   TSH 2.120 0.450 - 4.500 uIU/mL  Hepatitis C Antibody  Result Value Ref Range   Hep C Virus Ab <0.1 0.0 - 0.9 s/co ratio      Assessment & Plan:   Problem List Items Addressed This Visit      Cardiovascular and Mediastinum   Essential hypertension - Primary    The current medical regimen is effective;  continue present plan and medications.       Relevant Orders   Basic metabolic panel   LP+ALT+AST [place], Waived     Respiratory   COPD (chronic obstructive pulmonary disease) ([place])     Other   Hypercholesteremia    The current medical regimen is effective;  continue present plan and medications.       Relevant Orders   Basic metabolic panel   LP+ALT+AST [place], Waived   Chronic alcoholism ([place])    Resolved and stable          Follow up plan: Return in about 6 months (around 01/11/2017) for Physical Exam.

## 2016-07-15 ENCOUNTER — Encounter: Payer: Self-pay | Admitting: Family Medicine

## 2016-07-15 LAB — BASIC METABOLIC PANEL
BUN/Creatinine Ratio: 22 (ref 10–24)
BUN: 23 mg/dL (ref 8–27)
CALCIUM: 9.8 mg/dL (ref 8.6–10.2)
CO2: 26 mmol/L (ref 18–29)
CREATININE: 1.03 mg/dL (ref 0.76–1.27)
Chloride: 100 mmol/L (ref 96–106)
GFR calc Af Amer: 85 mL/min/{1.73_m2} (ref >59)
GFR, EST NON AFRICAN AMERICAN: 73 mL/min/{1.73_m2} (ref >59)
GLUCOSE: 107 mg/dL — AB (ref 65–99)
Potassium: 4.5 mmol/L (ref 3.5–5.2)
SODIUM: 141 mmol/L (ref 134–144)

## 2016-09-20 ENCOUNTER — Other Ambulatory Visit: Payer: Self-pay | Admitting: Family Medicine

## 2016-09-20 DIAGNOSIS — I10 Essential (primary) hypertension: Secondary | ICD-10-CM

## 2016-09-21 ENCOUNTER — Telehealth: Payer: Self-pay | Admitting: Family Medicine

## 2016-09-21 NOTE — Telephone Encounter (Signed)
RX sent into pharmacy

## 2016-09-21 NOTE — Telephone Encounter (Signed)
Patient would like a refill on losartan-hydrochlorothiazide (HYZAAR) 100-25 MG tablet [[id number]] sent to [facility] on [address].  Patient is completely out of RX.  Please call patient to advise.

## 2017-01-06 ENCOUNTER — Ambulatory Visit (INDEPENDENT_AMBULATORY_CARE_PROVIDER_SITE_OTHER): Payer: PPO

## 2017-01-06 VITALS — BP 138/68 | HR 46 | Temp 97.5°F | Resp 16 | Ht 67.0 in | Wt 162.4 lb

## 2017-01-06 DIAGNOSIS — Z0000 Encounter for general adult medical examination without abnormal findings: Secondary | ICD-10-CM

## 2017-01-06 NOTE — Progress Notes (Signed)
Subjective:   Carl Allen is a 71 y.o. male who presents for Medicare Annual/Subsequent preventive examination.  Review of Systems:  Cardiac Risk Factors include: male gender;smoking/ tobacco exposure;advanced age (>86men, >52 women);hypertension     Objective:    Vitals: BP 138/68   Pulse (!) 46 Comment: informed MD  Temp (!) 97.5 F (36.4 C)   Resp 16   Ht 5\' 7"  (1.702 m)   Wt 162 lb 6.4 oz (73.7 kg)   BMI 25.44 kg/m   Body mass index is 25.44 kg/m.  Tobacco History  Smoking Status  . Current Every Day Smoker  . Packs/day: 1.50  Smokeless Tobacco  . Never Used     Ready to quit: No Counseling given: Yes   Past Medical History:  Diagnosis Date  . Allergy   . Anxiety    not often  . Cataract   . Chronic alcoholism ([place])   . COPD (chronic obstructive pulmonary disease) ([place])   . Depression    pt states sometimes  . Emphysema of lung ([place])   . GERD (gastroesophageal reflux disease)    pt states every now and then  . Hypertriglyceridemia    Past Surgical History:  Procedure Laterality Date  . CATARACT EXTRACTION, BILATERAL  2014  . TONSILLECTOMY AND ADENOIDECTOMY  1952   Family History  Problem Relation Age of Onset  . Arthritis Mother   . Heart disease Mother   . Hearing loss Mother   . Hyperlipidemia Mother   . Hypertension Mother   . Kidney disease Mother   . Miscarriages / [place] Mother   . Vision loss Mother   . Stroke Father   . Heart disease Maternal Grandmother   . Stroke Maternal Grandfather   . Cancer Paternal Grandfather    History  Sexual Activity  . Sexual activity: Not Currently    Outpatient Encounter Prescriptions as of 01/06/2017  Medication Sig  . carvedilol (COREG) 25 MG tablet Take 1 tablet (25 mg total) by mouth 2 (two) times daily. (Patient taking differently: Take 25 mg by mouth 2 (two) times daily. Taking once a day)  . fenofibrate micronized (LOFIBRA) 134 MG capsule Take 1 capsule (134 mg total) by mouth daily  before breakfast.  . fexofenadine (ALLEGRA) 180 MG tablet Take 180 mg by mouth as needed.   [name] losartan-hydrochlorothiazide (HYZAAR) 100-25 MG tablet Take 0.5 tablets by mouth daily.  [name] losartan-hydrochlorothiazide (HYZAAR) 100-25 MG tablet TAKE ONE-HALF TABLET BY MOUTH ONCE DAILY  . tiotropium (SPIRIVA HANDIHALER) 18 MCG inhalation capsule Place 1 capsule (18 mcg total) into inhaler and inhale every morning.  . [DISCONTINUED] fluticasone furoate-vilanterol (BREO ELLIPTA) 100-25 MCG/INH AEPB Inhale 1 puff into the lungs every morning. (Patient not taking: Reported on 01/06/2017)   No facility-administered encounter medications on file as of 01/06/2017.     Activities of Daily Living In your present state of health, do you have any difficulty performing the following activities: 01/06/2017 01/08/2016  Hearing? N N  Vision? N N  Difficulty concentrating or making decisions? N N  Walking or climbing stairs? N N  Dressing or bathing? N N  Doing errands, shopping? N N  Preparing Food and eating ? N -  Using the Toilet? N -  In the past six months, have you accidently leaked urine? N -  Do you have problems with loss of bowel control? N -  Managing your Medications? N -  Managing your Finances? N -  Housekeeping or managing your Housekeeping?  N -  Some recent data might be hidden    Patient Care Team: [name], MD as PCP - General (Family Medicine) Erby Pian, MD as Referring Physician (Specialist) Teodoro Spray, MD as Consulting Physician (Cardiology)   Assessment:     Exercise Activities and Dietary recommendations Current Exercise Habits: The patient does not participate in regular exercise at present, Exercise limited by: None identified  Goals    . Quit smoking / using tobacco          Smoking cessation discussed      Fall Risk Fall Risk  01/06/2017 07/14/2016 01/08/2016 08/07/2015  Falls in the past year? No No No No   Depression Screen PHQ 2/9 Scores 01/06/2017  07/14/2016 01/08/2016 08/07/2015  PHQ - 2 Score 0 0 0 1    Cognitive Function     6CIT Screen 01/06/2017  What Year? 0 points  What month? 0 points  What time? 0 points  Count back from 20 0 points  Months in reverse 0 points  Repeat phrase 0 points  Total Score 0    Immunization History  Administered Date(s) Administered  . Influenza-Unspecified 02/09/2015, 02/24/2016  . Pneumococcal Conjugate-13 12/21/2013  . Pneumococcal Polysaccharide-23 03/22/2008, 02/13/2013  . Td 09/20/2001  . Tdap 01/08/2016  . Zoster 12/18/2010   Screening Tests Health Maintenance  Topic Date Due  . INFLUENZA VACCINE  12/30/2016  . COLONOSCOPY  07/02/2020  . TETANUS/TDAP  01/07/2026  . Hepatitis C Screening  Completed  . PNA vac Low Risk Adult  Completed      Plan:    I have personally reviewed and addressed the Medicare Annual Wellness questionnaire and have noted the following in the patient's chart:  A. Medical and social history B. Use of alcohol, tobacco or illicit drugs  C. Current medications and supplements D. Functional ability and status E.  Nutritional status F.  Physical activity G. Advance directives H. List of other physicians I.  Hospitalizations, surgeries, and ER visits in previous 12 months J.  [place] such as hearing and vision if needed, cognitive and depression L. Referrals and appointments - none  In addition, I have reviewed and discussed with patient certain preventive protocols, quality metrics, and best practice recommendations. A written personalized care plan for preventive services as well as general preventive health recommendations were provided to patient.   Signed,  Tyler Aas, LPN Nurse Health Advisor   MD Recommendations:Patients heart rate fluctuating in the low 40's. Patient states he only takes 1 25mg  carvedilol a day. Spoke with Wynona Dove. Patient to stop taking carvedilol until appt with Dr. Jeananne Rama on 01/12/2017.

## 2017-01-06 NOTE — Patient Instructions (Addendum)
Carl Allen , Thank you for taking time to come for your Medicare Wellness Visit. I appreciate your ongoing commitment to your health goals. Please review the following plan we discussed and let me know if I can assist you in the future.   Screening recommendations/referrals: Colonoscopy: Due 07/2020 Recommended yearly ophthalmology/optometry visit for glaucoma screening and checkup Recommended yearly dental visit for hygiene and checkup  Vaccinations: Influenza vaccine: up to date, due 01/2017 Pneumococcal vaccine: up to date Tdap vaccine: up to date Shingles vaccine: up to date  Advanced directives: Advance directive discussed with you today. I have provided a copy for you to complete at home and have notarized. Once this is complete please bring a copy in to our office so we can scan it into your chart.  Conditions/risks identified: Smoking cessation discussed  Next appointment: Follow up on 01/12/2017 at 9:00am with Dr.Crissman, Follow up in one year for your annual wellness exam.   Preventive Care 71 Years and Older, Male Preventive care refers to lifestyle choices and visits with your health care provider that can promote health and wellness. What does preventive care include?  A yearly physical exam. This is also called an annual well check.  Dental exams once or twice a year.  Routine eye exams. Ask your health care provider how often you should have your eyes checked.  Personal lifestyle choices, including:  Daily care of your teeth and gums.  Regular physical activity.  Eating a healthy diet.  Avoiding tobacco and drug use.  Limiting alcohol use.  Practicing safe sex.  Taking low doses of aspirin every day.  Taking vitamin and mineral supplements as recommended by your health care provider. What happens during an annual well check? The services and screenings done by your health care provider during your annual well check will depend on your age, overall health,  lifestyle risk factors, and family history of disease. Counseling  Your health care provider may ask you questions about your:  Alcohol use.  Tobacco use.  Drug use.  Emotional well-being.  Home and relationship well-being.  Sexual activity.  Eating habits.  History of falls.  Memory and ability to understand (cognition).  Work and work Statistician. Screening  You may have the following tests or measurements:  Height, weight, and BMI.  Blood pressure.  Lipid and cholesterol levels. These may be checked every 5 years, or more frequently if you are over 63 years old.  Skin check.  Lung cancer screening. You may have this screening every year starting at age 71 if you have a 30-pack-year history of smoking and currently smoke or have quit within the past 15 years.  Fecal occult blood test (FOBT) of the stool. You may have this test every year starting at age 71  Flexible sigmoidoscopy or colonoscopy. You may have a sigmoidoscopy every 5 years or a colonoscopy every 10 years starting at age 71  Prostate cancer screening. Recommendations will vary depending on your family history and other risks.  Hepatitis C blood test.  Hepatitis B blood test.  Sexually transmitted disease (STD) testing.  Diabetes screening. This is done by checking your blood sugar (glucose) after you have not eaten for a while (fasting). You may have this done every 1-3 years.  Abdominal aortic aneurysm (AAA) screening. You may need this if you are a current or former smoker.  Osteoporosis. You may be screened starting at age 71 if you are at high risk. Talk with your health care provider about your  test results, treatment options, and if necessary, the need for more tests. Vaccines  Your health care provider may recommend certain vaccines, such as:  Influenza vaccine. This is recommended every year.  Tetanus, diphtheria, and acellular pertussis (Tdap, Td) vaccine. You may need a Td booster  every 10 years.  Zoster vaccine. You may need this after age 71  Pneumococcal 13-valent conjugate (PCV13) vaccine. One dose is recommended after age 71  Pneumococcal polysaccharide (PPSV23) vaccine. One dose is recommended after age 71 Talk to your health care provider about which screenings and vaccines you need and how often you need them. This information is not intended to replace advice given to you by your health care provider. Make sure you discuss any questions you have with your health care provider. Document Released: 06/14/2015 Document Revised: 02/05/2016 Document Reviewed: 03/19/2015 Elsevier Interactive Patient Education  2017 [place] Prevention in the Home Falls can cause injuries. They can happen to people of all ages. There are many things you can do to make your home safe and to help prevent falls. What can I do on the outside of my home?  Regularly fix the edges of walkways and driveways and fix any cracks.  Remove anything that might make you trip as you walk through a door, such as a raised step or threshold.  Trim any bushes or trees on the path to your home.  Use bright outdoor lighting.  Clear any walking paths of anything that might make someone trip, such as rocks or tools.  Regularly check to see if handrails are loose or broken. Make sure that both sides of any steps have handrails.  Any raised decks and porches should have guardrails on the edges.  Have any leaves, snow, or ice cleared regularly.  Use sand or salt on walking paths during winter.  Clean up any spills in your garage right away. This includes oil or grease spills. What can I do in the bathroom?  Use night lights.  Install grab bars by the toilet and in the tub and shower. Do not use towel bars as grab bars.  Use non-skid mats or decals in the tub or shower.  If you need to sit down in the shower, use a plastic, non-slip stool.  Keep the floor dry. Clean up any  water that spills on the floor as soon as it happens.  Remove soap buildup in the tub or shower regularly.  Attach bath mats securely with double-sided non-slip rug tape.  Do not have throw rugs and other things on the floor that can make you trip. What can I do in the bedroom?  Use night lights.  Make sure that you have a light by your bed that is easy to reach.  Do not use any sheets or blankets that are too big for your bed. They should not hang down onto the floor.  Have a firm chair that has side arms. You can use this for support while you get dressed.  Do not have throw rugs and other things on the floor that can make you trip. What can I do in the kitchen?  Clean up any spills right away.  Avoid walking on wet floors.  Keep items that you use a lot in easy-to-reach places.  If you need to reach something above you, use a strong step stool that has a grab bar.  Keep electrical cords out of the way.  Do not use floor polish or wax that  makes floors slippery. If you must use wax, use non-skid floor wax.  Do not have throw rugs and other things on the floor that can make you trip. What can I do with my stairs?  Do not leave any items on the stairs.  Make sure that there are handrails on both sides of the stairs and use them. Fix handrails that are broken or loose. Make sure that handrails are as long as the stairways.  Check any carpeting to make sure that it is firmly attached to the stairs. Fix any carpet that is loose or worn.  Avoid having throw rugs at the top or bottom of the stairs. If you do have throw rugs, attach them to the floor with carpet tape.  Make sure that you have a light switch at the top of the stairs and the bottom of the stairs. If you do not have them, ask someone to add them for you. What else can I do to help prevent falls?  Wear shoes that:  Do not have high heels.  Have rubber bottoms.  Are comfortable and fit you well.  Are closed  at the toe. Do not wear sandals.  If you use a stepladder:  Make sure that it is fully opened. Do not climb a closed stepladder.  Make sure that both sides of the stepladder are locked into place.  Ask someone to hold it for you, if possible.  Clearly mark and make sure that you can see:  Any grab bars or handrails.  First and last steps.  Where the edge of each step is.  Use tools that help you move around (mobility aids) if they are needed. These include:  Canes.  Walkers.  Scooters.  Crutches.  Turn on the lights when you go into a dark area. Replace any light bulbs as soon as they burn out.  Set up your furniture so you have a clear path. Avoid moving your furniture around.  If any of your floors are uneven, fix them.  If there are any pets around you, be aware of where they are.  Review your medicines with your doctor. Some medicines can make you feel dizzy. This can increase your chance of falling. Ask your doctor what other things that you can do to help prevent falls. This information is not intended to replace advice given to you by your health care provider. Make sure you discuss any questions you have with your health care provider. Document Released: 03/14/2009 Document Revised: 10/24/2015 Document Reviewed: 06/22/2014 Elsevier Interactive Patient Education  2017 [employer].

## 2017-01-12 ENCOUNTER — Encounter: Payer: Self-pay | Admitting: Family Medicine

## 2017-01-12 ENCOUNTER — Ambulatory Visit (INDEPENDENT_AMBULATORY_CARE_PROVIDER_SITE_OTHER): Payer: PPO | Admitting: Family Medicine

## 2017-01-12 VITALS — BP 128/89 | HR 64 | Resp 16 | Ht 67.0 in | Wt 162.6 lb

## 2017-01-12 DIAGNOSIS — Z1329 Encounter for screening for other suspected endocrine disorder: Secondary | ICD-10-CM

## 2017-01-12 DIAGNOSIS — I10 Essential (primary) hypertension: Secondary | ICD-10-CM | POA: Diagnosis not present

## 2017-01-12 DIAGNOSIS — F1020 Alcohol dependence, uncomplicated: Secondary | ICD-10-CM | POA: Diagnosis not present

## 2017-01-12 DIAGNOSIS — Z7189 Other specified counseling: Secondary | ICD-10-CM | POA: Diagnosis not present

## 2017-01-12 DIAGNOSIS — E7800 Pure hypercholesterolemia, unspecified: Secondary | ICD-10-CM

## 2017-01-12 DIAGNOSIS — J410 Simple chronic bronchitis: Secondary | ICD-10-CM

## 2017-01-12 DIAGNOSIS — Z131 Encounter for screening for diabetes mellitus: Secondary | ICD-10-CM | POA: Diagnosis not present

## 2017-01-12 DIAGNOSIS — Z125 Encounter for screening for malignant neoplasm of prostate: Secondary | ICD-10-CM

## 2017-01-12 DIAGNOSIS — Z0000 Encounter for general adult medical examination without abnormal findings: Secondary | ICD-10-CM

## 2017-01-12 LAB — MICROSCOPIC EXAMINATION: BACTERIA UA: NONE SEEN

## 2017-01-12 LAB — URINALYSIS, ROUTINE W REFLEX MICROSCOPIC
Bilirubin, UA: NEGATIVE
Glucose, UA: NEGATIVE
Ketones, UA: NEGATIVE
LEUKOCYTES UA: NEGATIVE
Nitrite, UA: NEGATIVE
PH UA: 7 (ref 5.0–7.5)
PROTEIN UA: NEGATIVE
Specific Gravity, UA: 1.02 (ref 1.005–1.030)
UUROB: 0.2 mg/dL (ref 0.2–1.0)

## 2017-01-12 MED ORDER — VARENICLINE TARTRATE 0.5 MG X 11 & 1 MG X 42 PO MISC
ORAL | 0 refills | Status: DC
Start: 2017-01-12 — End: 2017-09-29

## 2017-01-12 MED ORDER — TIOTROPIUM BROMIDE MONOHYDRATE 18 MCG IN CAPS: 18.0000 ug | ORAL_CAPSULE | Freq: Every morning | RESPIRATORY_TRACT | 4 refills | Status: DC

## 2017-01-12 MED ORDER — FENOFIBRATE MICRONIZED 134 MG PO CAPS: 134.0000 mg | ORAL_CAPSULE | Freq: Every day | ORAL | 4 refills | Status: DC

## 2017-01-12 MED ORDER — VARENICLINE TARTRATE 1 MG PO TABS: 1.0000 mg | ORAL_TABLET | Freq: Two times a day (BID) | ORAL | 4 refills | Status: DC

## 2017-01-12 MED ORDER — LOSARTAN POTASSIUM-HCTZ 100-25 MG PO TABS: 0.5000 | ORAL_TABLET | Freq: Every day | ORAL | 4 refills | Status: DC

## 2017-01-12 NOTE — Assessment & Plan Note (Signed)
Ready to quit smoking will use Chantix

## 2017-01-12 NOTE — Assessment & Plan Note (Signed)
The current medical regimen is effective;  continue present plan and medications.  

## 2017-01-12 NOTE — Assessment & Plan Note (Signed)
A voluntary discussion about advance care planning including the explanation and discussion of advance directives was extensively discussed  with the patient.  Explanation about the health care proxy and Living will was reviewed and packet with forms with explanation of how to fill them out was given.  Will bring copies. 

## 2017-01-12 NOTE — Progress Notes (Signed)
BP 128/89   Pulse 64   Resp 16   Ht 5\' 7"  (1.702 m)   Wt 162 lb 9.6 oz (73.8 kg)   SpO2 99%   BMI 25.47 kg/m    Subjective:    Patient ID: Carl Allen, male    DOB: Jul 25, 1945, 71 y.o.   MRN: [id number]  HPI: Carl Allen is a 71 y.o. male  Chief Complaint  Patient presents with  . Annual Exam  . Nicotine Dependence    Wants to try Chantix and quit smoking.    Patient all in all doing well is been able to quit alcohol except for rare social situations. Patient doing better feeling better and is been able to lose weight. The patient's ready to tackle his smoking cessation. Depressions doing better and is been off antidepressants for over a year now without relapse or problems. Is ready to try Chantix again. Blood pressure doing well with no complaints from medications. Breathing doing okay except having some occasional coughing is taking the Spiriva every day. Blood pressure on review doing especially well had stopped carvedilol almost 2 weeks ago because of low pulse and blood pressures done well pulse is improved. Also feels like he has a little more energy. Takes triglyceride medicines without problems also.  Relevant past medical, surgical, family and social history reviewed and updated as indicated. Interim medical history since our last visit reviewed. Allergies and medications reviewed and updated.  Review of Systems  Constitutional: Negative.   HENT: Negative.   Eyes: Negative.   Respiratory: Negative.   Cardiovascular: Negative.   Gastrointestinal: Negative.   Endocrine: Negative.   Genitourinary: Negative.   Musculoskeletal: Negative.   Skin: Negative.   Allergic/Immunologic: Negative.   Neurological: Negative.   Hematological: Negative.   Psychiatric/Behavioral: Negative.     Per HPI unless specifically indicated above     Objective:    BP 128/89   Pulse 64   Resp 16   Ht 5\' 7"  (1.702 m)   Wt 162 lb 9.6 oz (73.8 kg)   SpO2 99%   BMI 25.47  kg/m   Wt Readings from Last 3 Encounters:  01/12/17 162 lb 9.6 oz (73.8 kg)  01/06/17 162 lb 6.4 oz (73.7 kg)  07/14/16 169 lb 8 oz (76.9 kg)    Physical Exam  Constitutional: He is oriented to person, place, and time. He appears well-developed and well-nourished.  HENT:  Head: Normocephalic and atraumatic.  Right Ear: External ear normal.  Left Ear: External ear normal.  Eyes: Pupils are equal, round, and reactive to light. Conjunctivae and EOM are normal.  Neck: Normal range of motion. Neck supple.  Cardiovascular: Normal rate, regular rhythm, normal heart sounds and intact distal pulses.   Pulmonary/Chest: Effort normal and breath sounds normal.  Abdominal: Soft. Bowel sounds are normal. There is no splenomegaly or hepatomegaly.  Genitourinary: Rectum normal, prostate normal and penis normal.  Musculoskeletal: Normal range of motion.  Neurological: He is alert and oriented to person, place, and time. He has normal reflexes.  Skin: No rash noted. No erythema.  Psychiatric: He has a normal mood and affect. His behavior is normal. Judgment and thought content normal.    Results for orders placed or performed in visit on 84/69/62  Basic metabolic panel  Result Value Ref Range   Glucose 107 (H) 65 - 99 mg/dL   BUN 23 8 - 27 mg/dL   Creatinine, Ser 1.03 0.76 - 1.27 mg/dL   GFR calc  non Af Amer 73 >59 mL/min/1.73   GFR calc Af Amer 85 >59 mL/min/1.73   BUN/Creatinine Ratio 22 10 - 24   Sodium 141 134 - 144 mmol/L   Potassium 4.5 3.5 - 5.2 mmol/L   Chloride 100 96 - 106 mmol/L   CO2 26 18 - 29 mmol/L   Calcium 9.8 8.6 - 10.2 mg/dL  LP+ALT+AST Piccolo, Waived  Result Value Ref Range   ALT (SGPT) Piccolo, Waived 11 10 - 47 U/L   AST (SGOT) Piccolo, Waived 17 11 - 38 U/L   Cholesterol Piccolo, Waived 207 (H) <200 mg/dL   HDL Chol Piccolo, Waived 37 (L) >59 mg/dL   Triglycerides Piccolo,Waived 246 (H) <150 mg/dL   Chol/HDL Ratio Piccolo,Waive 5.7 (H) mg/dL   LDL Chol Calc  Piccolo Waived 121 (H) <100 mg/dL   VLDL Chol Calc Piccolo,Waive 49 (H) <30 mg/dL      Assessment & Plan:   Problem List Items Addressed This Visit      Cardiovascular and Mediastinum   Essential hypertension    The current medical regimen is effective;  continue present plan and medications.       Relevant Medications   fenofibrate micronized (LOFIBRA) 134 MG capsule   losartan-hydrochlorothiazide (HYZAAR) 100-25 MG tablet   Other Relevant Orders   CBC with Differential/Platelet     Respiratory   COPD (chronic obstructive pulmonary disease) (HCC)    Ready to quit smoking will use Chantix      Relevant Medications   tiotropium (SPIRIVA HANDIHALER) 18 MCG inhalation capsule   varenicline (CHANTIX STARTING MONTH PAK) 0.5 MG X 11 & 1 MG X 42 tablet   varenicline (CHANTIX CONTINUING MONTH PAK) 1 MG tablet     Other   Hypercholesteremia    The current medical regimen is effective;  continue present plan and medications.       Relevant Medications   fenofibrate micronized (LOFIBRA) 134 MG capsule   losartan-hydrochlorothiazide (HYZAAR) 100-25 MG tablet   Other Relevant Orders   Lipid panel   Chronic alcoholism ([place])    Decreased drinking      Advanced care planning/counseling discussion    A voluntary discussion about advance care planning including the explanation and discussion of advance directives was extensively discussed  with the patient.  Explanation about the health care proxy and Living will was reviewed and packet with forms with explanation of how to fill them out was given. Will bring copies.        Other Visit Diagnoses    Annual physical exam    -  Primary   Screening for diabetes mellitus (DM)       Relevant Orders   Comprehensive metabolic panel   Urinalysis, Routine w reflex microscopic   Prostate cancer screening       Relevant Orders   PSA   Thyroid disorder screen       Relevant Orders   TSH       Follow up plan: Return in about 6  months (around 07/15/2017) for BMP,  Lipids, ALT, AST.

## 2017-01-12 NOTE — Assessment & Plan Note (Signed)
Decreased drinking

## 2017-01-13 ENCOUNTER — Telehealth: Payer: Self-pay | Admitting: Family Medicine

## 2017-01-13 DIAGNOSIS — R972 Elevated prostate specific antigen [PSA]: Secondary | ICD-10-CM

## 2017-01-13 LAB — COMPREHENSIVE METABOLIC PANEL
ALT: 9 IU/L (ref 0–44)
AST: 12 IU/L (ref 0–40)
Albumin/Globulin Ratio: 1.8 (ref 1.2–2.2)
Albumin: 4.5 g/dL (ref 3.5–4.8)
Alkaline Phosphatase: 57 IU/L (ref 39–117)
BUN/Creatinine Ratio: 18 (ref 10–24)
BUN: 16 mg/dL (ref 8–27)
Bilirubin Total: 0.6 mg/dL (ref 0.0–1.2)
CALCIUM: 9.7 mg/dL (ref 8.6–10.2)
CO2: 23 mmol/L (ref 20–29)
CREATININE: 0.88 mg/dL (ref 0.76–1.27)
Chloride: 103 mmol/L (ref 96–106)
GFR calc Af Amer: 100 mL/min/{1.73_m2} (ref >59)
GFR, EST NON AFRICAN AMERICAN: 86 mL/min/{1.73_m2} (ref >59)
Globulin, Total: 2.5 g/dL (ref 1.5–4.5)
Glucose: 111 mg/dL — ABNORMAL HIGH (ref 65–99)
Potassium: 4.4 mmol/L (ref 3.5–5.2)
Sodium: 142 mmol/L (ref 134–144)
Total Protein: 7 g/dL (ref 6.0–8.5)

## 2017-01-13 LAB — LIPID PANEL
CHOL/HDL RATIO: 5.8 ratio — AB (ref 0.0–5.0)
CHOLESTEROL TOTAL: 161 mg/dL (ref 100–199)
HDL: 28 mg/dL — ABNORMAL LOW (ref >39)
LDL CALC: 75 mg/dL (ref 0–99)
TRIGLYCERIDES: 290 mg/dL — AB (ref 0–149)
VLDL CHOLESTEROL CAL: 58 mg/dL — AB (ref 5–40)

## 2017-01-13 LAB — CBC WITH DIFFERENTIAL/PLATELET
Basophils Absolute: 0 10*3/uL (ref 0.0–0.2)
Basos: 1 %
EOS (ABSOLUTE): 0.1 10*3/uL (ref 0.0–0.4)
EOS: 2 %
Hematocrit: 42.4 % (ref 37.5–51.0)
Hemoglobin: 14.3 g/dL (ref 13.0–17.7)
IMMATURE GRANULOCYTES: 0 %
Immature Grans (Abs): 0 10*3/uL (ref 0.0–0.1)
Lymphocytes Absolute: 1.6 10*3/uL (ref 0.7–3.1)
Lymphs: 27 %
MCH: 31.1 pg (ref 26.6–33.0)
MCHC: 33.7 g/dL (ref 31.5–35.7)
MCV: 92 fL (ref 79–97)
Monocytes Absolute: 0.4 10*3/uL (ref 0.1–0.9)
Monocytes: 6 %
NEUTROS PCT: 64 %
Neutrophils Absolute: 3.9 10*3/uL (ref 1.4–7.0)
PLATELETS: 182 10*3/uL (ref 150–379)
RBC: 4.6 x10E6/uL (ref 4.14–5.80)
RDW: 13.9 % (ref 12.3–15.4)
WBC: 6 10*3/uL (ref 3.4–10.8)

## 2017-01-13 LAB — TSH: TSH: 2.65 u[IU]/mL (ref 0.450–4.500)

## 2017-01-13 LAB — PSA: Prostate Specific Ag, Serum: 5 ng/mL — ABNORMAL HIGH (ref 0.0–4.0)

## 2017-01-13 NOTE — Telephone Encounter (Signed)
Noted. Will call. Pt is in Lab results list. Will close this encounter.

## 2017-01-13 NOTE — Telephone Encounter (Signed)
Phone call Discussed with patient elevating PSA from 3, 2 years ago increased 4 one year ago and now is 5.

## 2017-01-13 NOTE — Telephone Encounter (Signed)
Patient calling to inform provider that he will have his phone ready for the recent lab results. Patient is aware that provider will call him at his best convience this afternoon.

## 2017-02-03 ENCOUNTER — Ambulatory Visit: Payer: PPO | Admitting: Urology

## 2017-02-03 VITALS — BP 165/91 | HR 94 | Ht 67.0 in | Wt 163.4 lb

## 2017-02-03 DIAGNOSIS — R972 Elevated prostate specific antigen [PSA]: Secondary | ICD-10-CM | POA: Diagnosis not present

## 2017-02-03 NOTE — Progress Notes (Signed)
02/03/2017 10:52 AM   Carl Allen September 11, 1945 [id number]  Referring provider: [name], MD [address] [place], [place] [address]  No chief complaint on file.   HPI: The patient is a 71 year old gentleman who presents today for evaluation of a rising and elevated PSA. It is currently 5.0. He has no genitourinary complaints at this time. He has never had a prostate biopsy before. He has some non-bothersome daytime frequency. He does not have nocturia. He has a good stream. He empties his bladder. He does not strain or hesitancy. No history of nephrolithiasis or stones.  He does have a grandfather and cousin with prostate cancer. His grandfather died at the age of 66 from prostate cancer. His cousin underwent a prostatectomy at the age of 42 and is currently doing well.  PSA history: 5.0 - August 2018 4.0 - August 2017 3.1 - August 2016     PMH: Past Medical History:  Diagnosis Date  . Allergy   . Anxiety    not often  . Cataract   . Chronic alcoholism ([place])   . COPD (chronic obstructive pulmonary disease) ([place])   . Depression    pt states sometimes  . Emphysema of lung ([place])   . GERD (gastroesophageal reflux disease)    pt states every now and then  . Hypertriglyceridemia     Surgical History: Past Surgical History:  Procedure Laterality Date  . CATARACT EXTRACTION, BILATERAL  2014  . TONSILLECTOMY AND ADENOIDECTOMY  1952    Home Medications:  Allergies as of 02/03/2017   No Known Allergies     Medication List       Accurate as of 02/03/17 10:52 AM. Always use your most recent med list.          fenofibrate micronized 134 MG capsule Commonly known as:  LOFIBRA Take 1 capsule (134 mg total) by mouth daily before breakfast.   fexofenadine 180 MG tablet Commonly known as:  ALLEGRA Take 180 mg by mouth as needed.   losartan-hydrochlorothiazide 100-25 MG tablet Commonly known as:  HYZAAR Take 0.5 tablets by mouth daily.   tiotropium 18 MCG  inhalation capsule Commonly known as:  SPIRIVA HANDIHALER Place 1 capsule (18 mcg total) into inhaler and inhale every morning.   varenicline 0.5 MG X 11 & 1 MG X 42 tablet Commonly known as:  CHANTIX STARTING MONTH PAK 1- 0.5 mg tablet by mouth  daily for 3 days, then  to one 0.5 mg tablet twice daily for 4 days, then  to one 1 mg tablet twice daily   varenicline 1 MG tablet Commonly known as:  CHANTIX CONTINUING MONTH PAK Take 1 tablet (1 mg total) by mouth 2 (two) times daily.       Allergies: No Known Allergies  Family History: Family History  Problem Relation Age of Onset  . Arthritis Mother   . Heart disease Mother   . Hearing loss Mother   . Hyperlipidemia Mother   . Hypertension Mother   . Kidney disease Mother   . Miscarriages / [place] Mother   . Vision loss Mother   . Stroke Father   . Heart disease Maternal Grandmother   . Stroke Maternal Grandfather   . Cancer Paternal Grandfather     Social History:  reports that he has been smoking.  He has been smoking about 1.50 packs per day. He has never used smokeless tobacco. He reports that he drinks alcohol. He reports that he does not use  drugs.  ROS:                                        Physical Exam: BP (!) 165/91 (BP Location: Left Arm, Patient Position: Sitting, Cuff Size: Large)   Pulse 94   Ht 5\' 7"  (1.702 m)   Wt 163 lb 6.4 oz (74.1 kg)   BMI 25.59 kg/m   Constitutional:  Alert and oriented, No acute distress. HEENT: [place] AT, moist mucus membranes.  Trachea midline, no masses. Cardiovascular: No clubbing, cyanosis, or edema. Respiratory: Normal respiratory effort, no increased work of breathing. GI: Abdomen is soft, nontender, nondistended, no abdominal masses GU: No CVA tenderness. Normal phallus. Testicles descended equally bilaterally. No masses. DRE: 2+ smooth benign. Skin: No rashes, bruises or suspicious lesions. Lymph: No cervical or inguinal  adenopathy. Neurologic: Grossly intact, no focal deficits, moving all 4 extremities. Psychiatric: Normal mood and affect.  Laboratory Data: Lab Results  Component Value Date   WBC 6.0 01/12/2017   HGB 14.3 01/12/2017   HCT 42.4 01/12/2017   MCV 92 01/12/2017   PLT 182 01/12/2017    Lab Results  Component Value Date   CREATININE 0.88 01/12/2017    No results found for: PSA  No results found for: TESTOSTERONE  No results found for: HGBA1C  Urinalysis    Component Value Date/Time   COLORURINE Yellow 09/21/2013 1740   APPEARANCEUR Clear 01/12/2017 0905   LABSPEC 1.017 09/21/2013 1740   PHURINE 5.0 09/21/2013 1740   GLUCOSEU Negative 01/12/2017 0905   GLUCOSEU 50 mg/dL 09/21/2013 1740   HGBUR 1+ 09/21/2013 1740   BILIRUBINUR Negative 01/12/2017 0905   BILIRUBINUR Negative 09/21/2013 1740   KETONESUR Trace 09/21/2013 1740   PROTEINUR Negative 01/12/2017 0905   PROTEINUR 30 mg/dL 09/21/2013 1740   NITRITE Negative 01/12/2017 0905   NITRITE Negative 09/21/2013 1740   LEUKOCYTESUR Negative 01/12/2017 0905   LEUKOCYTESUR Negative 09/21/2013 1740    Assessment & Plan:    1. Elevated/rising PSA Discussed the patient that his PSA is both rising and consider elevated at this time. We discussed options including prostate biopsy. We discussed the risks, benefits, indications of this procedure. He understands the risks include but are not limited to bleeding and infection. He will expect blood in his urine and stool for 48 hours and in his semen first up to 6 weeks. He understands there is a risk of infection resulting in hospitalization of approximate 1% despite periprocedural antibiotics. All questions were answered. The patient has elected to proceed.  Return for prostate biopsy.  Nickie Retort, MD  [facility] [address], [place],  [address] [phone]

## 2017-03-04 ENCOUNTER — Ambulatory Visit (INDEPENDENT_AMBULATORY_CARE_PROVIDER_SITE_OTHER): Payer: PPO | Admitting: Urology

## 2017-03-04 ENCOUNTER — Other Ambulatory Visit: Payer: Self-pay | Admitting: Urology

## 2017-03-04 VITALS — BP 143/89 | HR 73 | Ht 67.0 in | Wt 162.0 lb

## 2017-03-04 DIAGNOSIS — R972 Elevated prostate specific antigen [PSA]: Secondary | ICD-10-CM | POA: Diagnosis not present

## 2017-03-04 DIAGNOSIS — C61 Malignant neoplasm of prostate: Secondary | ICD-10-CM | POA: Diagnosis not present

## 2017-03-04 MED ORDER — GENTAMICIN SULFATE 40 MG/ML IJ SOLN
80.0000 mg | Freq: Once | INTRAMUSCULAR | Status: AC
  Administered 2017-03-04: 80 mg via INTRAMUSCULAR

## 2017-03-04 MED ORDER — LEVOFLOXACIN 500 MG PO TABS
500.0000 mg | ORAL_TABLET | Freq: Once | ORAL | Status: AC
  Administered 2017-03-04: 500 mg via ORAL

## 2017-03-04 MED ORDER — LIDOCAINE HCL 2 % EX GEL
1.0000 "application " | Freq: Once | CUTANEOUS | Status: AC
  Administered 2017-03-04: 1 via URETHRAL

## 2017-03-04 NOTE — Progress Notes (Signed)
Prostate Biopsy Procedure   Informed consent was obtained after discussing risks/benefits of the procedure.  A time out was performed to ensure correct patient identity.  Pre-Procedure: - Last PSA Level: 5.0 - Gentamicin given prophylactically - Levaquin 500 mg administered PO -Transrectal Ultrasound performed revealing a 37 gm prostate -No significant hypoechoic or median lobe noted  Procedure: - Prostate block performed using 10 cc 1% lidocaine and biopsies taken from sextant areas, a total of 12 under ultrasound guidance.  Post-Procedure: - Patient tolerated the procedure well - He was counseled to seek immediate medical attention if experiences any severe pain, significant bleeding, or fevers - Return in one week to discuss biopsy results

## 2017-03-11 ENCOUNTER — Other Ambulatory Visit: Payer: Self-pay | Admitting: Urology

## 2017-03-11 LAB — PATHOLOGY REPORT

## 2017-03-18 ENCOUNTER — Ambulatory Visit (INDEPENDENT_AMBULATORY_CARE_PROVIDER_SITE_OTHER): Payer: PPO | Admitting: Urology

## 2017-03-18 ENCOUNTER — Encounter: Payer: Self-pay | Admitting: Urology

## 2017-03-18 VITALS — BP 155/99 | HR 67 | Ht 67.0 in | Wt 168.4 lb

## 2017-03-18 DIAGNOSIS — C61 Malignant neoplasm of prostate: Secondary | ICD-10-CM | POA: Diagnosis not present

## 2017-03-18 NOTE — Progress Notes (Signed)
03/18/2017 10:13 AM   Carl Allen [birth year] [id number]  Referring provider: [name], MD [address] [place], [place] [address]  Chief Complaint  Patient presents with  . Follow-up    Biopsy reults    HPI: The patient is a 71 year old gentleman who presents today for follow-up of his prostate biopsy results.  1. 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 Allergy   . Anxiety    not often  . Cataract   . Chronic alcoholism ([place])   . COPD (chronic obstructive pulmonary disease) ([place])   . Depression    pt states sometimes  . Emphysema of lung ([place])   . GERD (gastroesophageal reflux disease)    pt states every now and then  . Hypertriglyceridemia     Surgical History: Past Surgical History:  Procedure Laterality Date  . CATARACT EXTRACTION, BILATERAL  2014  . TONSILLECTOMY AND ADENOIDECTOMY  1952    Home Medications:  Allergies as of 03/18/2017   No Known Allergies     Medication List       Accurate as of 03/18/17 10:13 AM. Always use your most recent med list.          fenofibrate micronized 134 MG capsule Commonly known as:  LOFIBRA Take 1 capsule (134 mg total) by mouth daily before breakfast.   fexofenadine 180 MG tablet Commonly known as:  ALLEGRA Take 180 mg by mouth as needed.   losartan-hydrochlorothiazide 100-25 MG tablet Commonly known as:  HYZAAR Take 0.5 tablets by mouth daily.   tiotropium 18 MCG inhalation capsule Commonly known as:  SPIRIVA HANDIHALER Place 1 capsule (18 mcg total) into inhaler and inhale every morning.   varenicline 0.5 MG X 11 & 1 MG X 42 tablet Commonly known as:  CHANTIX STARTING MONTH PAK 1- 0.5 mg tablet by mouth  daily for 3 days, then  to one 0.5 mg tablet twice daily for 4 days, then  to one 1 mg tablet twice  daily   varenicline 1 MG tablet Commonly known as:  CHANTIX CONTINUING MONTH PAK Take 1 tablet (1 mg total) by mouth 2 (two) times daily.       Allergies: No Known Allergies  Family History: Family History  Problem Relation Age of Onset  . Arthritis Mother   . Heart disease Mother   . Hearing loss Mother   . Hyperlipidemia Mother   . Hypertension Mother   . Kidney disease Mother   . Miscarriages / [place] Mother   . Vision loss Mother   . Stroke Father   . Heart disease Maternal Grandmother   . Stroke Maternal Grandfather   . Cancer Paternal Grandfather     Social History:  reports that he has been smoking.  He has been smoking about 1.50 packs per day. He has never used smokeless tobacco. He reports that he drinks alcohol. He reports that he does not use drugs.  ROS: UROLOGY Frequent Urination?: Yes Hard to postpone urination?: No Burning/pain with urination?: No Get up at night to urinate?: No Leakage of urine?: No Urine stream starts and stops?: No Trouble starting stream?: No Do you have to strain to urinate?: No Blood in urine?: No Urinary tract infection?: No Sexually transmitted disease?: No  Injury to kidneys or bladder?: No Painful intercourse?: No Weak stream?: No Erection problems?: No Penile pain?: No  Gastrointestinal Nausea?: No Vomiting?: No Indigestion/heartburn?: No Diarrhea?: No Constipation?: No  Constitutional Fever: No Night sweats?: No Weight loss?: No Fatigue?: No  Skin Skin rash/lesions?: No Itching?: No  Eyes Blurred vision?: No Double vision?: No  Ears/Nose/Throat Sore throat?: No Sinus problems?: No  Hematologic/Lymphatic Swollen glands?: No Easy bruising?: No  Cardiovascular Leg swelling?: No Chest pain?: No  Respiratory Cough?: Yes Shortness of breath?: Yes  Endocrine Excessive thirst?: No  Musculoskeletal Back pain?: No Joint pain?: No  Neurological Headaches?: No Dizziness?:  No  Psychologic Depression?: No Anxiety?: No  Physical Exam: BP (!) 155/99 (BP Location: Right Arm, Patient Position: Sitting, Cuff Size: Normal)   Pulse 67   Ht 5\' 7"  (1.702 m)   Wt 168 lb 6.4 oz (76.4 kg)   BMI 26.38 kg/m   Constitutional:  Alert and oriented, No acute distress. HEENT: [employer] AT, moist mucus membranes.  Trachea midline, no masses. Cardiovascular: No clubbing, cyanosis, or edema. Respiratory: Normal respiratory effort, no increased work of breathing. GI: Abdomen is soft, nontender, nondistended, no abdominal masses GU: No CVA tenderness.  Skin: No rashes, bruises or suspicious lesions. Lymph: No cervical or inguinal adenopathy. Neurologic: Grossly intact, no focal deficits, moving all 4 extremities. Psychiatric: Normal mood and affect.  Laboratory Data: Lab Results  Component Value Date   WBC 6.0 01/12/2017   HGB 14.3 01/12/2017   HCT 42.4 01/12/2017   MCV 92 01/12/2017   PLT 182 01/12/2017    Lab Results  Component Value Date   CREATININE 0.88 01/12/2017    No results found for: PSA  No results found for: TESTOSTERONE  No results found for: HGBA1C  Urinalysis    Component Value Date/Time   COLORURINE Yellow 09/21/2013 1740   APPEARANCEUR Clear 01/12/2017 0905   LABSPEC 1.017 09/21/2013 1740   PHURINE 5.0 09/21/2013 1740   GLUCOSEU Negative 01/12/2017 0905   GLUCOSEU 50 mg/dL 09/21/2013 1740   HGBUR 1+ 09/21/2013 1740   BILIRUBINUR Negative 01/12/2017 0905   BILIRUBINUR Negative 09/21/2013 1740   KETONESUR Trace 09/21/2013 1740   PROTEINUR Negative 01/12/2017 0905   PROTEINUR 30 mg/dL 09/21/2013 1740   NITRITE Negative 01/12/2017 0905   NITRITE Negative 09/21/2013 1740   LEUKOCYTESUR Negative 01/12/2017 0905   LEUKOCYTESUR Negative 09/21/2013 1740    Assessment & Plan:    1. 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 We discussed treatment  options which include watchful waiting, active surveillance, robotic prostatectomy, radiation therapy, and cryotherapy. We discussed the risks and benefits of each. We focused on active surveillance. We did discuss that this is now the standard of care patient's with very low risk disease according to the American urological Association. We discussed that it does involve active follow-up on his part. He understands the goal is to avoid treatment as it is often worse than the disease itself in patients with his disease burden. We discussed the next step would be repeating his prostate biopsy and approximate 6 months to confirm his risk stratification. After that he will need frequent PSA and DRE as well as repeat biopsy in a few years. All questions were answered. The patient has agreed to proceed with active surveillance.  Return in about 6 months (around 09/16/2017) for prostate biopsy.  Nickie Retort, MD  [facility] [phone]  [address] [place], [place] [address] [phone]

## 2017-03-30 ENCOUNTER — Ambulatory Visit: Payer: PPO

## 2017-04-07 ENCOUNTER — Ambulatory Visit (INDEPENDENT_AMBULATORY_CARE_PROVIDER_SITE_OTHER): Payer: PPO

## 2017-04-07 DIAGNOSIS — Z23 Encounter for immunization: Secondary | ICD-10-CM

## 2017-06-24 ENCOUNTER — Telehealth: Payer: Self-pay | Admitting: Family Medicine

## 2017-06-24 MED ORDER — HYDROCHLOROTHIAZIDE 25 MG PO TABS: 25.0000 mg | ORAL_TABLET | Freq: Every day | ORAL | 3 refills | Status: DC

## 2017-06-24 MED ORDER — BENAZEPRIL HCL 40 MG PO TABS: 40.0000 mg | ORAL_TABLET | Freq: Every day | ORAL | 3 refills | Status: DC

## 2017-06-24 NOTE — Telephone Encounter (Signed)
Patient was transferred to provider for telephone conversation.   

## 2017-06-24 NOTE — Telephone Encounter (Signed)
Copied from [place]. Topic: Quick Communication - See Telephone Encounter >> Jun 24, 2017 10:10 AM Bea Graff, NT wrote: CRM for notification. See Telephone encounter for: Pt calling and states that he went to [place] yesterday to pick up his losartan-hydrochlorothiazide [facility]) and they told pt this has been recalled and that they would send a fax over to the office to get the provider to call in a different medication for this pt. Pt is almost out of his medication and would like to have something called in. Pt would also like a call from Dr. Jeananne Rama  06/24/17.

## 2017-06-24 NOTE — Telephone Encounter (Signed)
Call pt 

## 2017-06-25 ENCOUNTER — Telehealth: Payer: Self-pay | Admitting: Family Medicine

## 2017-06-25 NOTE — Telephone Encounter (Signed)
Patient needs refill on the following  Losartan/hctz 100-25mg  tab   Luanna Cole [phone]  Thanks

## 2017-06-28 NOTE — Telephone Encounter (Signed)
Be sure phone calls from patient's for medications and asked them to contact the pharmacy

## 2017-07-15 ENCOUNTER — Ambulatory Visit: Payer: PPO | Admitting: Family Medicine

## 2017-08-26 ENCOUNTER — Encounter: Payer: Self-pay | Admitting: Family Medicine

## 2017-08-26 ENCOUNTER — Ambulatory Visit (INDEPENDENT_AMBULATORY_CARE_PROVIDER_SITE_OTHER): Payer: PPO | Admitting: Family Medicine

## 2017-08-26 VITALS — BP 158/98 | HR 61 | Wt 162.0 lb

## 2017-08-26 DIAGNOSIS — C61 Malignant neoplasm of prostate: Secondary | ICD-10-CM | POA: Insufficient documentation

## 2017-08-26 DIAGNOSIS — I10 Essential (primary) hypertension: Secondary | ICD-10-CM

## 2017-08-26 DIAGNOSIS — E7800 Pure hypercholesterolemia, unspecified: Secondary | ICD-10-CM

## 2017-08-26 LAB — LP+ALT+AST PICCOLO, WAIVED
ALT (SGPT) Piccolo, Waived: 22 U/L (ref 10–47)
AST (SGOT) PICCOLO, WAIVED: 22 U/L (ref 11–38)
CHOLESTEROL PICCOLO, WAIVED: 189 mg/dL (ref <200)
Chol/HDL Ratio Piccolo,Waive: 3.3 mg/dL
HDL Chol Piccolo, Waived: 57 mg/dL — ABNORMAL LOW (ref >59)
LDL Chol Calc Piccolo Waived: 55 mg/dL (ref <100)
TRIGLYCERIDES PICCOLO,WAIVED: 385 mg/dL — AB (ref <150)
VLDL Chol Calc Piccolo,Waive: 77 mg/dL — ABNORMAL HIGH (ref <30)

## 2017-08-26 MED ORDER — AMLODIPINE BESYLATE 5 MG PO TABS
5.0000 mg | ORAL_TABLET | Freq: Every day | ORAL | 3 refills | Status: DC
Start: 2017-08-26 — End: 2017-09-29

## 2017-08-26 NOTE — Progress Notes (Signed)
   BP (!) 158/98   Pulse 61   Wt 162 lb (73.5 kg)   SpO2 98%   BMI 25.37 kg/m    Subjective:    Patient ID: Carl Allen, male    DOB: Feb 23, 1946, 72 y.o.   MRN: [id number]  HPI: Carl Allen is a 72 y.o. male  Chief Complaint  Patient presents with  . Follow-up   Patient follow-up hypertension has been doing well taking medications faithfully medicine did change several months ago due to losartan recall.  Has done well with medications but may not be as effective. Has been working on lifestyle with decreased alcohol weight loss and doing better with eating. Breathing doing well with Spiriva. Unfortunately continues to smoke.  Relevant past medical, surgical, family and social history reviewed and updated as indicated. Interim medical history since our last visit reviewed. Allergies and medications reviewed and updated.  Review of Systems  Constitutional: Negative.   Respiratory: Negative.   Cardiovascular: Negative.     Per HPI unless specifically indicated above     Objective:    BP (!) 158/98   Pulse 61   Wt 162 lb (73.5 kg)   SpO2 98%   BMI 25.37 kg/m   Wt Readings from Last 3 Encounters:  08/26/17 162 lb (73.5 kg)  03/18/17 168 lb 6.4 oz (76.4 kg)  03/04/17 162 lb (73.5 kg)    Physical Exam  Constitutional: He is oriented to person, place, and time. He appears well-developed and well-nourished.  HENT:  Head: Normocephalic and atraumatic.  Eyes: Conjunctivae and EOM are normal.  Neck: Normal range of motion.  Cardiovascular: Normal rate, regular rhythm and normal heart sounds.  Pulmonary/Chest: Effort normal and breath sounds normal.  Musculoskeletal: Normal range of motion.  Neurological: He is alert and oriented to person, place, and time.  Skin: No erythema.  Psychiatric: He has a normal mood and affect. His behavior is normal. Judgment and thought content normal.        Assessment & Plan:   Problem List Items Addressed This Visit      Cardiovascular and Mediastinum   Essential hypertension - Primary    Discussed discussed hypertension and care and treatment patient will continue lifestyle measures will add amlodipine 5 mg      Relevant Medications   amLODipine (NORVASC) 5 MG tablet   Other Relevant Orders   Basic metabolic panel   LP+ALT+AST Piccolo, Waived     Genitourinary   Prostate cancer ([place])    bx pos last mo        Other   Hypercholesteremia    The current medical regimen is effective;  continue present plan and medications.       Relevant Medications   amLODipine (NORVASC) 5 MG tablet   Other Relevant Orders   Basic metabolic panel   LP+ALT+AST Piccolo, Waived       Follow up plan: Return in about 1 month (around 09/23/2017) for BP check.

## 2017-08-26 NOTE — Assessment & Plan Note (Signed)
Discussed discussed hypertension and care and treatment patient will continue lifestyle measures will add amlodipine 5 mg

## 2017-08-26 NOTE — Assessment & Plan Note (Signed)
The current medical regimen is effective;  continue present plan and medications.  

## 2017-08-26 NOTE — Assessment & Plan Note (Signed)
bx pos last mo

## 2017-08-27 ENCOUNTER — Encounter: Payer: Self-pay | Admitting: Family Medicine

## 2017-08-27 LAB — BASIC METABOLIC PANEL
BUN/Creatinine Ratio: 18 (ref 10–24)
BUN: 15 mg/dL (ref 8–27)
CALCIUM: 9.9 mg/dL (ref 8.6–10.2)
CHLORIDE: 103 mmol/L (ref 96–106)
CO2: 24 mmol/L (ref 20–29)
Creatinine, Ser: 0.83 mg/dL (ref 0.76–1.27)
GFR calc Af Amer: 102 mL/min/{1.73_m2} (ref >59)
GFR calc non Af Amer: 89 mL/min/{1.73_m2} (ref >59)
GLUCOSE: 91 mg/dL (ref 65–99)
Potassium: 4.1 mmol/L (ref 3.5–5.2)
Sodium: 141 mmol/L (ref 134–144)

## 2017-09-16 ENCOUNTER — Encounter: Payer: Self-pay | Admitting: Urology

## 2017-09-16 ENCOUNTER — Ambulatory Visit: Payer: PPO | Admitting: Urology

## 2017-09-16 ENCOUNTER — Other Ambulatory Visit: Payer: Self-pay | Admitting: Urology

## 2017-09-16 VITALS — BP 140/83 | HR 83 | Ht 67.0 in | Wt 163.4 lb

## 2017-09-16 DIAGNOSIS — C61 Malignant neoplasm of prostate: Secondary | ICD-10-CM

## 2017-09-16 DIAGNOSIS — N4289 Other specified disorders of prostate: Secondary | ICD-10-CM | POA: Diagnosis not present

## 2017-09-16 DIAGNOSIS — R972 Elevated prostate specific antigen [PSA]: Secondary | ICD-10-CM | POA: Diagnosis not present

## 2017-09-16 NOTE — Progress Notes (Signed)
Prostate Biopsy Procedure   Informed consent was obtained after discussing risks/benefits of the procedure.  A time out was performed to ensure correct patient identity.  Pre-Procedure: - Last PSA Level: No results found for: PSA - Gentamicin given prophylactically - Levaquin 500 mg administered PO -Transrectal Ultrasound performed revealing a 36.87 gm prostate -No significant hypoechoic or median lobe noted  Procedure: - Prostate block performed using 10 cc 1% lidocaine and biopsies taken from sextant areas, a total of 12 under ultrasound guidance.  Post-Procedure: - Patient tolerated the procedure well - He was counseled to seek immediate medical attention if experiences any severe pain, significant bleeding, or fevers - Return in one week to discuss biopsy results

## 2017-09-22 ENCOUNTER — Other Ambulatory Visit: Payer: Self-pay | Admitting: Urology

## 2017-09-22 LAB — PATHOLOGY REPORT

## 2017-09-29 ENCOUNTER — Encounter: Payer: Self-pay | Admitting: Family Medicine

## 2017-09-29 ENCOUNTER — Ambulatory Visit (INDEPENDENT_AMBULATORY_CARE_PROVIDER_SITE_OTHER): Payer: PPO | Admitting: Family Medicine

## 2017-09-29 DIAGNOSIS — I10 Essential (primary) hypertension: Secondary | ICD-10-CM

## 2017-09-29 DIAGNOSIS — F329 Major depressive disorder, single episode, unspecified: Secondary | ICD-10-CM | POA: Diagnosis not present

## 2017-09-29 DIAGNOSIS — E7800 Pure hypercholesterolemia, unspecified: Secondary | ICD-10-CM

## 2017-09-29 DIAGNOSIS — F32A Depression, unspecified: Secondary | ICD-10-CM

## 2017-09-29 MED ORDER — AMLODIPINE BESYLATE 5 MG PO TABS: 5.0000 mg | ORAL_TABLET | Freq: Every day | ORAL | 2 refills | Status: DC

## 2017-09-29 NOTE — Assessment & Plan Note (Signed)
The current medical regimen is effective;  continue present plan and medications.  

## 2017-09-29 NOTE — Assessment & Plan Note (Signed)
Resolved off medicine

## 2017-09-29 NOTE — Progress Notes (Signed)
   BP 128/78 (BP Location: Left Arm)   Pulse 78   Ht 5\' 7"  (1.702 m)   Wt 165 lb (74.8 kg)   SpO2 98%   BMI 25.84 kg/m    Subjective:    Patient ID: Carl Allen, male    DOB: Apr 30, 1946, 72 y.o.   MRN: [id number]  HPI: FRANKEY BOTTING is a 72 y.o. male  Chief Complaint  Patient presents with  . Follow-up  . Hypertension  Follow up hypertension doing well taking amlodipine 5 mg with no problems no leg edema or issues.  Blood pressure seems to be doing well. Doing better with lifestyle decreased alcohol and better diet nutrition unfortunately continues to smoke. Patient's been through prostate biopsies gets results tomorrow.  Has recovered well from the biopsies. Cholesterol triglycerides also doing well along with breathing seems to be doing better. Relevant past medical, surgical, family and social history reviewed and updated as indicated. Interim medical history since our last visit reviewed. Allergies and medications reviewed and updated.  Review of Systems  Constitutional: Negative.   Respiratory: Negative.   Cardiovascular: Negative.     Per HPI unless specifically indicated above     Objective:    BP 128/78 (BP Location: Left Arm)   Pulse 78   Ht 5\' 7"  (1.702 m)   Wt 165 lb (74.8 kg)   SpO2 98%   BMI 25.84 kg/m   Wt Readings from Last 3 Encounters:  09/29/17 165 lb (74.8 kg)  09/16/17 163 lb 6.4 oz (74.1 kg)  08/26/17 162 lb (73.5 kg)    Physical Exam  Constitutional: He is oriented to person, place, and time. He appears well-developed and well-nourished.  HENT:  Head: Normocephalic and atraumatic.  Eyes: Conjunctivae and EOM are normal.  Neck: Normal range of motion.  Cardiovascular: Normal rate, regular rhythm and normal heart sounds.  Pulmonary/Chest: Effort normal and breath sounds normal.  Musculoskeletal: Normal range of motion.  Neurological: He is alert and oriented to person, place, and time.  Skin: No erythema.  Psychiatric: He has a  normal mood and affect. His behavior is normal. Judgment and thought content normal.        Assessment & Plan:   Problem List Items Addressed This Visit      Cardiovascular and Mediastinum   Essential hypertension    The current medical regimen is effective;  continue present plan and medications.       Relevant Medications   amLODipine (NORVASC) 5 MG tablet     Other   Hypercholesteremia    The current medical regimen is effective;  continue present plan and medications.       Relevant Medications   amLODipine (NORVASC) 5 MG tablet   RESOLVED: Depression    Resolved off medicine          Follow up plan: Return for Physical Exam, August.

## 2017-09-30 ENCOUNTER — Ambulatory Visit (INDEPENDENT_AMBULATORY_CARE_PROVIDER_SITE_OTHER): Payer: PPO | Admitting: Urology

## 2017-09-30 ENCOUNTER — Encounter: Payer: Self-pay | Admitting: Urology

## 2017-09-30 VITALS — BP 124/76 | HR 75 | Ht 67.0 in | Wt 164.7 lb

## 2017-09-30 DIAGNOSIS — C61 Malignant neoplasm of prostate: Secondary | ICD-10-CM | POA: Diagnosis not present

## 2017-09-30 NOTE — Progress Notes (Signed)
09/30/2017 11:23 AM   Carl Allen [birth year] [id number]  Referring provider: [name], MD [address] [place], [place] [address]  Chief Complaint  Patient presents with  . 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  Unfortunately pathology results do show more significant disease than previous biopsy.  His repeat biopsy showed intermediate risk disease with 4 of 12 cores positive for Gleason 3+4 = 7 prostate cancer.  His initial biopsy only had one core of Gleason 3+3 = 6 cancer (7%).  Pathology: 2 cores of Gleason 3 + 4= 7 (16%, 33%) at [facility] 2 cores of Gleason 3 + 3=6 (46%, 72%) at [facility], [facility]  His PSA at the time of diagnosis in August 2018 was 5.0.  He does have a family history of prostate cancer which was the cause of death of his grandfather.  He has a cousin that was treated with a prostatectomy.  He has no urinary complaints at this time.  He is not concerned with erectile function and is not sexually active.  PMH: Past Medical History:  Diagnosis Date  . Allergy   . Anxiety    not often  . Cataract   . Chronic alcoholism ([place])   . COPD (chronic obstructive pulmonary disease) ([place])   . Depression    pt states sometimes  . Emphysema of lung ([place])   . GERD (gastroesophageal reflux disease)    pt states every now and then  . Hypertriglyceridemia     Surgical History: Past Surgical History:  Procedure Laterality Date  . CATARACT EXTRACTION, BILATERAL  2014  . TONSILLECTOMY AND ADENOIDECTOMY  1952    Home Medications:  Allergies as of 09/30/2017   No Known Allergies     Medication List        Accurate as of 09/30/17 11:23 AM. Always use your most recent med list.          amLODipine 5 MG tablet Commonly known as:  NORVASC Take 1 tablet (5 mg total) by mouth daily.   benazepril 40 MG  tablet Commonly known as:  LOTENSIN Take 1 tablet (40 mg total) by mouth daily.   fenofibrate micronized 134 MG capsule Commonly known as:  LOFIBRA Take 1 capsule (134 mg total) by mouth daily before breakfast.   fexofenadine 180 MG tablet Commonly known as:  ALLEGRA Take 180 mg by mouth as needed.   hydrochlorothiazide 25 MG tablet Commonly known as:  HYDRODIURIL Take 1 tablet (25 mg total) by mouth daily.   tiotropium 18 MCG inhalation capsule Commonly known as:  SPIRIVA HANDIHALER Place 1 capsule (18 mcg total) into inhaler and inhale every morning.       Allergies: No Known Allergies  Family History: Family History  Problem Relation Age of Onset  . Arthritis Mother   . Heart disease Mother   . Hearing loss Mother   . Hyperlipidemia Mother   . Hypertension Mother   . Kidney disease Mother   . Miscarriages / [place] Mother   . Vision loss Mother   . Stroke Father   . Heart disease Maternal Grandmother   . Stroke Maternal Grandfather   . Cancer Paternal Grandfather     Social History:  reports that he has been smoking.  He has been smoking about 1.50 packs per day. He has never used smokeless tobacco.  He reports that he drinks alcohol. He reports that he does not use drugs.  ROS: UROLOGY Frequent Urination?: Yes Hard to postpone urination?: No Burning/pain with urination?: No Get up at night to urinate?: No Leakage of urine?: No Urine stream starts and stops?: No Trouble starting stream?: No Do you have to strain to urinate?: No Blood in urine?: No Urinary tract infection?: No Sexually transmitted disease?: No Injury to kidneys or bladder?: No Painful intercourse?: No Weak stream?: No Erection problems?: No Penile pain?: No  Gastrointestinal Nausea?: No Vomiting?: No Indigestion/heartburn?: No Diarrhea?: No Constipation?: No  Constitutional Fever: No Night sweats?: No Weight loss?: No Fatigue?: No  Skin Skin rash/lesions?:  No Itching?: No  Eyes Blurred vision?: No Double vision?: No  Ears/Nose/Throat Sore throat?: No Sinus problems?: No  Hematologic/Lymphatic Swollen glands?: No Easy bruising?: No  Cardiovascular Leg swelling?: No Chest pain?: No  Respiratory Cough?: No Shortness of breath?: No  Endocrine Excessive thirst?: No  Musculoskeletal Back pain?: No Joint pain?: No  Neurological Headaches?: No Dizziness?: No  Psychologic Depression?: No Anxiety?: No  Physical Exam: BP 124/76 (BP Location: Right Arm, Patient Position: Sitting, Cuff Size: Normal)   Pulse 75   Ht 5\' 7"  (1.702 m)   Wt 164 lb 11.2 oz (74.7 kg)   BMI 25.80 kg/m   Constitutional:  Alert and oriented, No acute distress. HEENT: [place] AT, moist mucus membranes.  Trachea midline, no masses. Cardiovascular: No clubbing, cyanosis, or edema. Respiratory: Normal respiratory effort, no increased work of breathing. GI: Abdomen is soft, nontender, nondistended, no abdominal masses GU: No CVA tenderness.   Skin: No rashes, bruises or suspicious lesions. Lymph: No cervical or inguinal adenopathy. Neurologic: Grossly intact, no focal deficits, moving all 4 extremities. Psychiatric: Normal mood and affect.  Laboratory Data: Lab Results  Component Value Date   WBC 6.0 01/12/2017   HGB 14.3 01/12/2017   HCT 42.4 01/12/2017   MCV 92 01/12/2017   PLT 182 01/12/2017    Lab Results  Component Value Date   CREATININE 0.83 08/26/2017    No results found for: PSA  No results found for: TESTOSTERONE  No results found for: HGBA1C  Urinalysis    Component Value Date/Time   COLORURINE Yellow 09/21/2013 1740   APPEARANCEUR Clear 01/12/2017 0905   LABSPEC 1.017 09/21/2013 1740   PHURINE 5.0 09/21/2013 1740   GLUCOSEU Negative 01/12/2017 0905   GLUCOSEU 50 mg/dL 09/21/2013 1740   HGBUR 1+ 09/21/2013 1740   BILIRUBINUR Negative 01/12/2017 0905   BILIRUBINUR Negative 09/21/2013 1740   KETONESUR Trace 09/21/2013  1740   PROTEINUR Negative 01/12/2017 0905   PROTEINUR 30 mg/dL 09/21/2013 1740   NITRITE Negative 01/12/2017 0905   NITRITE Negative 09/21/2013 1740   LEUKOCYTESUR Negative 01/12/2017 0905   LEUKOCYTESUR Negative 09/21/2013 1740     Assessment & Plan:    1.  Intermediate risk prostate cancer I had a extended conversation with the patient regarding his confirmatory biopsy for active surveillance that has changed his risk stratification.  He has moved from very low risk prostate cancer to intermediate risk prostate cancer.  We did discuss that generally intermediate risk prostate cancer is treated with a goal of cure.  We discussed treatment options which include both a prostatectomy and radiation therapy.  We discussed the robotic prostatectomy procedure in detail as well as the side effects.  We also discussed radiation therapy, both brachytherapy and external beam radiation therapy.  We discussed some of the side effects of this treatment  as well.  I would like for him to see our radiation oncologist, Dr. Baruch Gouty, to discuss radiation options in greater detail.  He is currently leaning towards radiation therapy and is especially interested in brachytherapy if he is a candidate.  I gave him the prostate cancer booklet and will have him follow-up in 1 month once he has had a chance to see Dr. Baruch Gouty.  Return in about 1 month (around 10/28/2017).  Nickie Retort, MD  [facility] [address], [place], [place] [address] [phone]

## 2017-10-04 ENCOUNTER — Institutional Professional Consult (permissible substitution): Payer: Self-pay | Admitting: Radiation Oncology

## 2017-10-07 ENCOUNTER — Ambulatory Visit
Admission: RE | Admit: 2017-10-07 | Discharge: 2017-10-07 | Disposition: A | Discharge status: Home / Self Care | Payer: PPO | Source: Ambulatory Visit | Attending: Radiation Oncology | Admitting: Radiation Oncology

## 2017-10-07 ENCOUNTER — Other Ambulatory Visit: Payer: Self-pay

## 2017-10-07 ENCOUNTER — Encounter: Payer: Self-pay | Admitting: Radiation Oncology

## 2017-10-07 VITALS — BP 136/81 | HR 83 | Temp 96.7°F | Resp 18 | Wt 164.2 lb

## 2017-10-07 DIAGNOSIS — K219 Gastro-esophageal reflux disease without esophagitis: Secondary | ICD-10-CM | POA: Diagnosis not present

## 2017-10-07 DIAGNOSIS — J439 Emphysema, unspecified: Secondary | ICD-10-CM | POA: Diagnosis not present

## 2017-10-07 DIAGNOSIS — F418 Other specified anxiety disorders: Secondary | ICD-10-CM | POA: Insufficient documentation

## 2017-10-07 DIAGNOSIS — F17210 Nicotine dependence, cigarettes, uncomplicated: Secondary | ICD-10-CM | POA: Diagnosis not present

## 2017-10-07 DIAGNOSIS — C61 Malignant neoplasm of prostate: Secondary | ICD-10-CM | POA: Insufficient documentation

## 2017-10-07 DIAGNOSIS — E781 Pure hyperglyceridemia: Secondary | ICD-10-CM | POA: Insufficient documentation

## 2017-10-07 DIAGNOSIS — F1020 Alcohol dependence, uncomplicated: Secondary | ICD-10-CM | POA: Insufficient documentation

## 2017-10-07 DIAGNOSIS — Z79899 Other long term (current) drug therapy: Secondary | ICD-10-CM | POA: Insufficient documentation

## 2017-10-07 NOTE — Consult Note (Signed)
NEW PATIENT EVALUATION  Name: Carl Allen  MRN: [id number]  Date:   10/07/2017     DOB: 1945/07/13   This 72 y.o. male patient presents to the clinic for initial evaluation of stage Iadenocarcinoma the prostate Gleason score of.  REFERRING PHYSICIAN: [name], MD  CHIEF COMPLAINT:  Chief Complaint  Patient presents with  . Prostate Cancer    Pt is here for initial consultation of prostate    DIAGNOSIS: The encounter diagnosis was Prostate cancer ([place]).   PREVIOUS INVESTIGATIONS:  Pathology reports reviewed Clinical notes reviewed  HPI: patient is a 72 year old male under active surveillance for at least the past 6 months when he presented with a PSA of 5 which had slowly been rising over the past several years.at that time transrectal ultrasound-guided biopsy showed 1 core positive for Gleason 6 adenocarcinoma.patient underwent repeat biopsy this month now showing 2 cores positive for Gleason 7 (3+4) as well as 2 cores positive for Gleason 63+3). He remains asymptomatic.he is discussed treatment options with urology and has decided on I-125 interstitial implant. He is seen today for consultation. He specifically denies any prior abdominal surgery hematuria or dysuria.bone scan her CT scans based on his low PSA is not indicated.  PLANNED TREATMENT REGIMEN: I-125 interstitial implant  PAST MEDICAL HISTORY:  has a past medical history of Allergy, Anxiety, Cataract, Chronic alcoholism ([place]), COPD (chronic obstructive pulmonary disease) ([place]), Depression, Emphysema of lung ([place]), GERD (gastroesophageal reflux disease), and Hypertriglyceridemia.    PAST SURGICAL HISTORY:  Past Surgical History:  Procedure Laterality Date  . CATARACT EXTRACTION, BILATERAL  2014  . TONSILLECTOMY AND ADENOIDECTOMY  1952    FAMILY HISTORY: family history includes Arthritis in his mother; Cancer in his paternal grandfather; Hearing loss in his mother; Heart disease in his maternal grandmother and  mother; Hyperlipidemia in his mother; Hypertension in his mother; Kidney disease in his mother; Miscarriages / Stillbirths in his mother; Stroke in his father and maternal grandfather; Vision loss in his mother.  SOCIAL HISTORY:  reports that he has been smoking.  He has been smoking about 1.50 packs per day. He has never used smokeless tobacco. He reports that he drinks alcohol. He reports that he does not use drugs.  ALLERGIES: Patient has no known allergies.  MEDICATIONS:  Current Outpatient Medications  Medication Sig Dispense Refill  . amLODipine (NORVASC) 5 MG tablet Take 1 tablet (5 mg total) by mouth daily. 90 tablet 2  . benazepril (LOTENSIN) 40 MG tablet Take 1 tablet (40 mg total) by mouth daily. 90 tablet 3  . fenofibrate micronized (LOFIBRA) 134 MG capsule Take 1 capsule (134 mg total) by mouth daily before breakfast. 90 capsule 4  . fexofenadine (ALLEGRA) 180 MG tablet Take 180 mg by mouth as needed.     . hydrochlorothiazide (HYDRODIURIL) 25 MG tablet Take 1 tablet (25 mg total) by mouth daily. 90 tablet 3  . tiotropium (SPIRIVA HANDIHALER) 18 MCG inhalation capsule Place 1 capsule (18 mcg total) into inhaler and inhale every morning. 90 capsule 4   No current facility-administered medications for this encounter.     ECOG PERFORMANCE STATUS:  0 - Asymptomatic  REVIEW OF SYSTEMS:  Patient denies any weight loss, fatigue, weakness, fever, chills or night sweats. Patient denies any loss of vision, blurred vision. Patient denies any ringing  of the ears or hearing loss. No irregular heartbeat. Patient denies heart murmur or history of fainting. Patient denies any chest pain or pain radiating to her upper  extremities. Patient denies any shortness of breath, difficulty breathing at night, cough or hemoptysis. Patient denies any swelling in the lower legs. Patient denies any nausea vomiting, vomiting of blood, or coffee ground material in the vomitus. Patient denies any stomach pain.  Patient states has had normal bowel movements no significant constipation or diarrhea. Patient denies any dysuria, hematuria or significant nocturia. Patient denies any problems walking, swelling in the joints or loss of balance. Patient denies any skin changes, loss of hair or loss of weight. Patient denies any excessive worrying or anxiety or significant depression. Patient denies any problems with insomnia. Patient denies excessive thirst, polyuria, polydipsia. Patient denies any swollen glands, patient denies easy bruising or easy bleeding. Patient denies any recent infections, allergies or URI. Patient "s visual fields have not changed significantly in recent time.    PHYSICAL EXAM: BP 136/81   Pulse 83   Temp (!) 96.7 F (35.9 C)   Resp 18   Wt 164 lb 3.9 oz (74.5 kg)   BMI 25.72 kg/m  On rectal exam rectal sphincter tone is good prostate is approximately 30 cc sulcus is preserved bilaterally there some slight asymmetry with the left lobe being a little more prominent than the right seminal vesicle regions appear unremarkable no other rectal abnormality is identified. Well-developed well-nourished patient in NAD. HEENT reveals PERLA, EOMI, discs not visualized.  Oral cavity is clear. No oral mucosal lesions are identified. Neck is clear without evidence of cervical or supraclavicular adenopathy. Lungs are clear to A&P. Cardiac examination is essentially unremarkable with regular rate and rhythm without murmur rub or thrill. Abdomen is benign with no organomegaly or masses noted. Motor sensory and DTR levels are equal and symmetric in the upper and lower extremities. Cranial nerves II through XII are grossly intact. Proprioception is intact. No peripheral adenopathy or edema is identified. No motor or sensory levels are noted. Crude visual fields are within normal range.  LABORATORY DATA: pathology reports reviewed    RADIOLOGY RESULTS:films for review   IMPRESSION: stage I adenocarcinoma  the prostate Gleason score of 63 (63+38)in 72 year old male  PLAN: I agree with the progression of his pathology reports from prior biopsy treatment is indicated at this healthy 72 year old male with a Gleason 7 adenocarcinoma the prostate. He is interested in I-125 interstitial implant which I believe he would be good candidate. We've also discussed external beam radiation. Risks and benefits of implant including general anesthesia risks and possible increased lower urinary tract symptoms possible diarrhea fatigue alteration of blood counts all were described in detail. We will schedule and coordinate with urology his volume study as well as his implant time. Patient comprehends my treatment plan well.  I would like to take this opportunity to thank you for allowing me to participate in the care of your patient.Noreene Filbert, MD

## 2017-10-15 ENCOUNTER — Other Ambulatory Visit: Payer: Self-pay | Admitting: Radiology

## 2017-10-15 DIAGNOSIS — C61 Malignant neoplasm of prostate: Secondary | ICD-10-CM

## 2017-11-04 ENCOUNTER — Ambulatory Visit: Payer: PPO

## 2017-11-19 ENCOUNTER — Encounter: Payer: Self-pay | Admitting: Family Medicine

## 2017-11-24 ENCOUNTER — Encounter: Payer: Self-pay | Admitting: Urology

## 2017-11-24 ENCOUNTER — Ambulatory Visit: Payer: PPO | Admitting: Urology

## 2017-11-24 VITALS — BP 132/89 | HR 83 | Ht 67.0 in | Wt 160.0 lb

## 2017-11-24 DIAGNOSIS — C61 Malignant neoplasm of prostate: Secondary | ICD-10-CM | POA: Diagnosis not present

## 2017-11-24 NOTE — Progress Notes (Signed)
11/24/2017 5:21 PM   Carl Allen 03/17/1946 [id number]  Referring provider: [name], MD [address] [place], [place] [address]  Chief Complaint  Patient presents with  . Prostate Cancer    1 month    HPI: 72 year old male scheduled for implantation of I-125 brachytherapy seeds who presents today to discuss this further.  He was initially diagnosed with very low risk prostate cancer on 03/2017 with Gleason 3+3.  He had confirmatory biopsy as part of his surveillance protocol on 08/2017 showing interval progression to Gleason 3+4 as well as a continued rise in his PSA.    Pathology: 2 cores of Gleason 3 + 4= 7 (16%, 33%) at [facility]   2 cores of Gleason 3 + 3=6 (46%, 72%) at [facility]    TRUS vol 36.8 g  His PSA at the time of diagnosis in August 2018 was 5.0.  He does have a family history of prostate cancer which was the cause of death of his grandfather.  He has a cousin that was treated with a prostatectomy.  He has no urinary complaints at this time.  He is not concerned with erectile function and is not sexually active.   PMH: Past Medical History:  Diagnosis Date  . Allergy   . Anxiety    not often  . Cataract   . Chronic alcoholism ([place])   . COPD (chronic obstructive pulmonary disease) ([place])   . Depression    pt states sometimes  . Emphysema of lung ([place])   . GERD (gastroesophageal reflux disease)    pt states every now and then  . Hypertriglyceridemia     Surgical History: Past Surgical History:  Procedure Laterality Date  . CATARACT EXTRACTION, BILATERAL  2014  . TONSILLECTOMY AND ADENOIDECTOMY  1952    Home Medications:  Allergies as of 11/24/2017   No Known Allergies     Medication List        Accurate as of 11/24/17  5:21 PM. Always use your most recent med list.          amLODipine 5 MG tablet Commonly known as:  NORVASC Take 1 tablet (5 mg total) by mouth daily.   benazepril 40 MG tablet Commonly known as:   LOTENSIN Take 1 tablet (40 mg total) by mouth daily.   fenofibrate micronized 134 MG capsule Commonly known as:  LOFIBRA Take 1 capsule (134 mg total) by mouth daily before breakfast.   fexofenadine 180 MG tablet Commonly known as:  ALLEGRA Take 180 mg by mouth as needed.   hydrochlorothiazide 25 MG tablet Commonly known as:  HYDRODIURIL Take 1 tablet (25 mg total) by mouth daily.   tiotropium 18 MCG inhalation capsule Commonly known as:  SPIRIVA HANDIHALER Place 1 capsule (18 mcg total) into inhaler and inhale every morning.       Allergies: No Known Allergies  Family History: Family History  Problem Relation Age of Onset  . Arthritis Mother   . Heart disease Mother   . Hearing loss Mother   . Hyperlipidemia Mother   . Hypertension Mother   . Kidney disease Mother   . Miscarriages / [place] Mother   . Vision loss Mother   . Stroke Father   . Heart disease Maternal Grandmother   . Stroke Maternal Grandfather   . Cancer Paternal Grandfather     Social History:  reports that he has been smoking.  He has been smoking about 1.50 packs per day. He has never  used smokeless tobacco. He reports that he drinks alcohol. He reports that he does not use drugs.  ROS: UROLOGY Frequent Urination?: No Hard to postpone urination?: No Burning/pain with urination?: No Get up at night to urinate?: No Leakage of urine?: No Urine stream starts and stops?: No Trouble starting stream?: No Do you have to strain to urinate?: No Blood in urine?: No Urinary tract infection?: No Sexually transmitted disease?: No Injury to kidneys or bladder?: No Painful intercourse?: No Weak stream?: No Erection problems?: No Penile pain?: No  Gastrointestinal Nausea?: No Vomiting?: No Indigestion/heartburn?: No Diarrhea?: No Constipation?: No  Constitutional Fever: No Night sweats?: No Weight loss?: No Fatigue?: No  Skin Skin rash/lesions?: No Itching?: No  Eyes Blurred  vision?: No Double vision?: No  Ears/Nose/Throat Sore throat?: No Sinus problems?: No  Hematologic/Lymphatic Swollen glands?: No Easy bruising?: No  Cardiovascular Leg swelling?: No Chest pain?: No  Respiratory Cough?: No Shortness of breath?: No  Endocrine Excessive thirst?: No  Musculoskeletal Back pain?: No Joint pain?: No  Neurological Headaches?: No Dizziness?: No  Psychologic Depression?: No Anxiety?: No  Physical Exam: BP 132/89   Pulse 83   Ht 5\' 7"  (1.702 m)   Wt 160 lb (72.6 kg)   BMI 25.06 kg/m   Constitutional:  Alert and oriented, No acute distress. HEENT: [place] AT, moist mucus membranes.  Trachea midline, no masses. Cardiovascular: No clubbing, cyanosis, or edema. Respiratory: Normal respiratory effort, no increased work of breathing. Skin: No rashes, bruises or suspicious lesions. Neurologic: Grossly intact, no focal deficits, moving all 4 extremities. Psychiatric: Normal mood and affect.  Laboratory Data: Lab Results  Component Value Date   WBC 6.0 01/12/2017   HGB 14.3 01/12/2017   HCT 42.4 01/12/2017   MCV 92 01/12/2017   PLT 182 01/12/2017    Lab Results  Component Value Date   CREATININE 0.83 08/26/2017   Component     Latest Ref Rng & Units 01/07/2015 01/08/2016 01/12/2017  Prostate Specific Ag, Serum     0.0 - 4.0 ng/mL 3.1 4.0 5.0 (H)    Urinalysis N/a  Pertinent Imaging: n/a  Assessment & Plan:    1. Prostate cancer ([place]) Gleason 3+4 intermediate risk prostate cancer scheduled for brachytherapy Discussed risk/ benefits and all additional questions answered   Hollice Espy, MD  [facility] [address], [place],  [address] [phone]

## 2017-12-13 ENCOUNTER — Ambulatory Visit
Admission: RE | Admit: 2017-12-13 | Discharge: 2017-12-13 | Disposition: A | Discharge status: Home / Self Care | Payer: PPO | Source: Ambulatory Visit | Attending: Radiation Oncology | Admitting: Radiation Oncology

## 2017-12-13 ENCOUNTER — Other Ambulatory Visit: Payer: Self-pay

## 2017-12-13 ENCOUNTER — Encounter: Payer: Self-pay | Admitting: Radiation Oncology

## 2017-12-13 VITALS — BP 146/93 | HR 73 | Temp 95.4°F | Resp 18 | Wt 163.3 lb

## 2017-12-13 DIAGNOSIS — R350 Frequency of micturition: Secondary | ICD-10-CM | POA: Diagnosis not present

## 2017-12-13 DIAGNOSIS — K219 Gastro-esophageal reflux disease without esophagitis: Secondary | ICD-10-CM | POA: Diagnosis not present

## 2017-12-13 DIAGNOSIS — F1020 Alcohol dependence, uncomplicated: Secondary | ICD-10-CM | POA: Insufficient documentation

## 2017-12-13 DIAGNOSIS — Z809 Family history of malignant neoplasm, unspecified: Secondary | ICD-10-CM | POA: Diagnosis not present

## 2017-12-13 DIAGNOSIS — C61 Malignant neoplasm of prostate: Secondary | ICD-10-CM | POA: Diagnosis not present

## 2017-12-13 DIAGNOSIS — E781 Pure hyperglyceridemia: Secondary | ICD-10-CM | POA: Insufficient documentation

## 2017-12-13 DIAGNOSIS — F17210 Nicotine dependence, cigarettes, uncomplicated: Secondary | ICD-10-CM | POA: Insufficient documentation

## 2017-12-13 DIAGNOSIS — J449 Chronic obstructive pulmonary disease, unspecified: Secondary | ICD-10-CM | POA: Diagnosis not present

## 2017-12-13 DIAGNOSIS — Z79899 Other long term (current) drug therapy: Secondary | ICD-10-CM | POA: Diagnosis not present

## 2017-12-13 DIAGNOSIS — J439 Emphysema, unspecified: Secondary | ICD-10-CM | POA: Insufficient documentation

## 2017-12-13 SURGERY — ULTRASOUND, PROSTATE, FOR VOLUME DETERMINATION
Anesthesia: Choice

## 2017-12-13 MED ORDER — CIPROFLOXACIN IN D5W 400 MG/200ML IV SOLN: 400.0000 mg | INTRAVENOUS | Status: DC

## 2017-12-13 MED ORDER — FLEET ENEMA 7-19 GM/118ML RE ENEM: 1.0000 | ENEMA | Freq: Once | RECTAL | Status: DC

## 2017-12-13 NOTE — H&P (View-Only) (Signed)
Radiation Oncology Prostate volume study Note  Name: Carl Allen   Date:   12/13/2017 MRN:  [id number] DOB: [birth year]    This 72 y.o. male presents to thehospital today for volume study in anticipation of I-125 interstitial implant forGleason 6 adenocarcinoma the prostate  REFERRING PROVIDER: [name], MD  HPI: patient is a 36 year oldmale who had been under active surveillance of the past 6 months when he presented with a PSA of 5 and transrectal ultrasound-guided biopsy showed a Gleason 6 adenocarcinoma. He underwent repeat biopsy last month showing Gleason 7 (3+4). He agreed to I-125 interstitial implant. He is seen today for volume study. He is otherwise doing well and is without complaint..  COMPLICATIONS OF TREATMENT: none  FOLLOW UP COMPLIANCE: keeps appointments   PHYSICAL EXAM:  There were no vitals taken for this visit. Well-developed well-nourished patient in NAD. HEENT reveals PERLA, EOMI, discs not visualized.  Oral cavity is clear. No oral mucosal lesions are identified. Neck is clear without evidence of cervical or supraclavicular adenopathy. Lungs are clear to A&P. Cardiac examination is essentially unremarkable with regular rate and rhythm without murmur rub or thrill. Abdomen is benign with no organomegaly or masses noted. Motor sensory and DTR levels are equal and symmetric in the upper and lower extremities. Cranial nerves II through XII are grossly intact. Proprioception is intact. No peripheral adenopathy or edema is identified. No motor or sensory levels are noted. Crude visual fields are within normal range.  RADIOLOGY RESULTS: ultrasound guidance use for volume study  PLAN: Patient was taken to the cystoscopy suite in the OR. Patient was placed in the low lithotomy position. Foley catheter was placed. Trans-rectal ultrasound probe was inserted into the rectum and prostate seminal vesicles were visualized as well as bladder base. stepping images were  performed on a 5 mm increments. Images will be placed in [facility] treatment planning system to determine seed placement coordinates for eventual I-125 interstitial implant. Images will be reviewed with the physics and dosimetry staff for final quality approval. I personally was present for the volume study and assisted in delineation of contour volumes.  At the end of the procedure Foley catheter was removed, rectal ultrasound probe was removed. Patient tolerated his procedures extremely well with no side effects or complaints. Patient has given appointment for interstitial implant date. Consent was signed today as well as history and physical performed in preparation for his outpatient surgical implant.      Noreene Filbert, MD

## 2017-12-13 NOTE — Progress Notes (Signed)
History and physical  Name: Carl Allen  MRN: [id number]  Date:   12/13/2017     DOB: 11/18/1945   This 72 y.o. male patient presents to the clinic for history and physical prior to I-125 interstitial implant for Gleason 7 (3+4) adenocarcinoma the prostate  REFERRING PHYSICIAN: [name], MD  CHIEF COMPLAINT:  Chief Complaint  Patient presents with  . Prostate Cancer    Pt is here post volume study    DIAGNOSIS: The encounter diagnosis was Prostate cancer ([place]).   PREVIOUS INVESTIGATIONS:  Clinical notes reviewed   HPI: patient is a 72 year old male who was under active surveillance for a Gleason 6 (3+3) adenocarcinoma the prostate when he presented with a PSA of 5. After 6 months and repeat biopsy showing Gleason 7 (3+4). He was seen in consultation and had decided on I-125 interstitial implant. He is seen today for volume study and history and physical prior to his procedure. He is doing well. He continues to have urinary frequency nocturia 4 he is on a diuretic.  PLANNED TREATMENT REGIMEN: I-125 interstitial implant  PAST MEDICAL HISTORY:  has a past medical history of Allergy, Anxiety, Cataract, Chronic alcoholism ([place]), COPD (chronic obstructive pulmonary disease) ([place]), Depression, Emphysema of lung ([place]), GERD (gastroesophageal reflux disease), and Hypertriglyceridemia.    PAST SURGICAL HISTORY:  Past Surgical History:  Procedure Laterality Date  . CATARACT EXTRACTION, BILATERAL  2014  . TONSILLECTOMY AND ADENOIDECTOMY  1952    FAMILY HISTORY: family history includes Arthritis in his mother; Cancer in his paternal grandfather; Hearing loss in his mother; Heart disease in his maternal grandmother and mother; Hyperlipidemia in his mother; Hypertension in his mother; Kidney disease in his mother; Miscarriages / Stillbirths in his mother; Stroke in his father and maternal grandfather; Vision loss in his mother.  SOCIAL HISTORY:  reports that he has been smoking.   He has been smoking about 1.50 packs per day. He has never used smokeless tobacco. He reports that he drinks alcohol. He reports that he does not use drugs.  ALLERGIES: Patient has no known allergies.  MEDICATIONS:  Current Outpatient Medications  Medication Sig Dispense Refill  . amLODipine (NORVASC) 5 MG tablet Take 1 tablet (5 mg total) by mouth daily. 90 tablet 2  . benazepril (LOTENSIN) 40 MG tablet Take 1 tablet (40 mg total) by mouth daily. 90 tablet 3  . cetirizine (ZYRTEC) 10 MG tablet Take 10 mg by mouth daily as needed for allergies.    . ferrous sulfate 325 (65 FE) MG tablet Take 325 mg by mouth daily with breakfast.    . hydrochlorothiazide (HYDRODIURIL) 25 MG tablet Take 1 tablet (25 mg total) by mouth daily. 90 tablet 3  . Multiple Vitamins-Minerals (CENTRUM SILVER 50+MEN) TABS Take 1 tablet by mouth daily.    [name] tiotropium (SPIRIVA HANDIHALER) 18 MCG inhalation capsule Place 1 capsule (18 mcg total) into inhaler and inhale every morning. 90 capsule 4  . fenofibrate micronized (LOFIBRA) 134 MG capsule Take 1 capsule (134 mg total) by mouth daily before breakfast. 90 capsule 4   No current facility-administered medications for this encounter.    Facility-Administered Medications Ordered in Other Encounters  Medication Dose Route Frequency Provider Last Rate Last Dose  . ciprofloxacin (CIPRO) IVPB 400 mg  400 mg Intravenous 60 min Pre-Op Hollice Espy, MD      . Derrill Memo ON 12/14/2017] sodium phosphate (FLEET) 7-19 GM/118ML enema 1 enema  1 enema Rectal Once Hollice Espy, MD  ECOG PERFORMANCE STATUS:  0 - Asymptomatic  REVIEW OF SYSTEMS:  Patient denies any weight loss, fatigue, weakness, fever, chills or night sweats. Patient denies any loss of vision, blurred vision. Patient denies any ringing  of the ears or hearing loss. No irregular heartbeat. Patient denies heart murmur or history of fainting. Patient denies any chest pain or pain radiating to her upper  extremities. Patient denies any shortness of breath, difficulty breathing at night, cough or hemoptysis. Patient denies any swelling in the lower legs. Patient denies any nausea vomiting, vomiting of blood, or coffee ground material in the vomitus. Patient denies any stomach pain. Patient states has had normal bowel movements no significant constipation or diarrhea. Patient denies any dysuria, hematuria or significant nocturia. Patient denies any problems walking, swelling in the joints or loss of balance. Patient denies any skin changes, loss of hair or loss of weight. Patient denies any excessive worrying or anxiety or significant depression. Patient denies any problems with insomnia. Patient denies excessive thirst, polyuria, polydipsia. Patient denies any swollen glands, patient denies easy bruising or easy bleeding. Patient denies any recent infections, allergies or URI. Patient "s visual fields have not changed significantly in recent time.    PHYSICAL EXAM: BP (!) 146/93   Pulse 73   Temp (!) 95.4 F (35.2 C)   Resp 18   Wt 163 lb 4 oz (74 kg)   BMI 25.57 kg/m  Well-developed well-nourished patient in NAD. HEENT reveals PERLA, EOMI, discs not visualized.  Oral cavity is clear. No oral mucosal lesions are identified. Neck is clear without evidence of cervical or supraclavicular adenopathy. Lungs are clear to A&P. Cardiac examination is essentially unremarkable with regular rate and rhythm without murmur rub or thrill. Abdomen is benign with no organomegaly or masses noted. Motor sensory and DTR levels are equal and symmetric in the upper and lower extremities. Cranial nerves II through XII are grossly intact. Proprioception is intact. No peripheral adenopathy or edema is identified. No motor or sensory levels are noted. Crude visual fields are within normal range.  LABORATORY DATA: pathology reports reviewed    RADIOLOGY RESULTS:ultrasound-guided S Dave I'm study performed  today   IMPRESSION: stage II adenocarcinoma the prostate in 72 year old male for I-125 interstitial implant  PLAN: present time volume study was performed in anticipation of placement of I-125 seeds in his prostate under general anesthesia. Risks and benefits as well as radiation safety precautions were reviewed with the patient. Patient comprehends our treatment plan well.consent was signed.  I would like to take this opportunity to thank you for allowing me to participate in the care of your patient.Noreene Filbert, MD

## 2017-12-13 NOTE — Progress Notes (Signed)
Radiation Oncology Prostate volume study Note  Name: Carl Allen   Date:   12/13/2017 MRN:  [id number] DOB: Mar 11, 1946    This 72 y.o. male presents to thehospital today for volume study in anticipation of I-125 interstitial implant forGleason 6 adenocarcinoma the prostate  REFERRING PROVIDER: [name], MD  HPI: patient is a 72 year oldmale who had been under active surveillance of the past 6 months when he presented with a PSA of 5 and transrectal ultrasound-guided biopsy showed a Gleason 6 adenocarcinoma. He underwent repeat biopsy last month showing Gleason 7 (3+4). He agreed to I-125 interstitial implant. He is seen today for volume study. He is otherwise doing well and is without complaint..  COMPLICATIONS OF TREATMENT: none  FOLLOW UP COMPLIANCE: keeps appointments   PHYSICAL EXAM:  There were no vitals taken for this visit. Well-developed well-nourished patient in NAD. HEENT reveals PERLA, EOMI, discs not visualized.  Oral cavity is clear. No oral mucosal lesions are identified. Neck is clear without evidence of cervical or supraclavicular adenopathy. Lungs are clear to A&P. Cardiac examination is essentially unremarkable with regular rate and rhythm without murmur rub or thrill. Abdomen is benign with no organomegaly or masses noted. Motor sensory and DTR levels are equal and symmetric in the upper and lower extremities. Cranial nerves II through XII are grossly intact. Proprioception is intact. No peripheral adenopathy or edema is identified. No motor or sensory levels are noted. Crude visual fields are within normal range.  RADIOLOGY RESULTS: ultrasound guidance use for volume study  PLAN: Patient was taken to the cystoscopy suite in the OR. Patient was placed in the low lithotomy position. Foley catheter was placed. Trans-rectal ultrasound probe was inserted into the rectum and prostate seminal vesicles were visualized as well as bladder base. stepping images were  performed on a 5 mm increments. Images will be placed in [facility] treatment planning system to determine seed placement coordinates for eventual I-125 interstitial implant. Images will be reviewed with the physics and dosimetry staff for final quality approval. I personally was present for the volume study and assisted in delineation of contour volumes.  At the end of the procedure Foley catheter was removed, rectal ultrasound probe was removed. Patient tolerated his procedures extremely well with no side effects or complaints. Patient has given appointment for interstitial implant date. Consent was signed today as well as history and physical performed in preparation for his outpatient surgical implant.      Noreene Filbert, MD

## 2017-12-14 DIAGNOSIS — C61 Malignant neoplasm of prostate: Secondary | ICD-10-CM | POA: Diagnosis not present

## 2017-12-15 ENCOUNTER — Ambulatory Visit: Admission: RE | Admit: 2017-12-15 | Payer: PPO | Source: Ambulatory Visit | Admitting: Urology

## 2018-01-03 ENCOUNTER — Other Ambulatory Visit: Payer: Self-pay

## 2018-01-03 ENCOUNTER — Encounter
Admission: RE | Admit: 2018-01-03 | Discharge: 2018-01-03 | Disposition: A | Discharge status: Home / Self Care | Payer: PPO | Source: Ambulatory Visit | Attending: Urology | Admitting: Urology

## 2018-01-03 DIAGNOSIS — I4510 Unspecified right bundle-branch block: Secondary | ICD-10-CM | POA: Diagnosis not present

## 2018-01-03 DIAGNOSIS — Z01812 Encounter for preprocedural laboratory examination: Secondary | ICD-10-CM | POA: Diagnosis not present

## 2018-01-03 DIAGNOSIS — Z01810 Encounter for preprocedural cardiovascular examination: Secondary | ICD-10-CM | POA: Insufficient documentation

## 2018-01-03 HISTORY — DX: Malignant (primary) neoplasm, unspecified: C80.1

## 2018-01-03 HISTORY — DX: Unspecified osteoarthritis, unspecified site: M19.90

## 2018-01-03 HISTORY — DX: Dyspnea, unspecified: R06.00

## 2018-01-03 LAB — HEPATIC FUNCTION PANEL
ALK PHOS: 41 U/L (ref 38–126)
ALT: 15 U/L (ref 0–44)
AST: 13 U/L — ABNORMAL LOW (ref 15–41)
Albumin: 4.1 g/dL (ref 3.5–5.0)
BILIRUBIN TOTAL: 1 mg/dL (ref 0.3–1.2)
Bilirubin, Direct: <0.1 mg/dL (ref 0.0–0.2)
Total Protein: 7.3 g/dL (ref 6.5–8.1)

## 2018-01-03 LAB — BASIC METABOLIC PANEL
ANION GAP: 9 (ref 5–15)
BUN: 19 mg/dL (ref 8–23)
CALCIUM: 9.4 mg/dL (ref 8.9–10.3)
CHLORIDE: 105 mmol/L (ref 98–111)
CO2: 28 mmol/L (ref 22–32)
Creatinine, Ser: 0.91 mg/dL (ref 0.61–1.24)
GFR calc non Af Amer: >60 mL/min (ref >60)
Glucose, Bld: 103 mg/dL — ABNORMAL HIGH (ref 70–99)
POTASSIUM: 4.1 mmol/L (ref 3.5–5.1)
Sodium: 142 mmol/L (ref 135–145)

## 2018-01-03 LAB — CBC
HEMATOCRIT: 41.2 % (ref 40.0–52.0)
HEMOGLOBIN: 14.5 g/dL (ref 13.0–18.0)
MCH: 33.8 pg (ref 26.0–34.0)
MCHC: 35.2 g/dL (ref 32.0–36.0)
MCV: 96 fL (ref 80.0–100.0)
Platelets: 185 10*3/uL (ref 150–440)
RBC: 4.29 MIL/uL — ABNORMAL LOW (ref 4.40–5.90)
RDW: 13.7 % (ref 11.5–14.5)
WBC: 7.2 10*3/uL (ref 3.8–10.6)

## 2018-01-03 NOTE — Patient Instructions (Signed)
Your procedure is scheduled on: Monday 01/10/18  Report to [facility]. To find out your arrival time please call [phone] between 1PM - 3PM on Friday 01/07/18  Remember: Instructions that are not followed completely may result in serious medical risk, up to and including death, or upon the discretion of your surgeon and anesthesiologist your surgery may need to be rescheduled.     _X__ 1. Do not eat food after midnight the night before your procedure.                 No gum chewing or hard candies. You may drink clear liquids up to 2 hours                 before you are scheduled to arrive for your surgery- DO not drink clear                 liquids within 2 hours of the start of your surgery.                 Clear Liquids include:  water, apple juice without pulp, clear carbohydrate                 drink such as Clearfast or Gatorade, Black Coffee or Tea (Do not add                 anything to coffee or tea).  __X__2.  On the morning of surgery brush your teeth with toothpaste and water, you may rinse your mouth with mouthwash if you wish.  Do not swallow any toothpaste of mouthwash.     _X__ 3.  No Alcohol for 24 hours before or after surgery.   _X__ 4.  Do Not Smoke or use e-cigarettes For 24 Hours Prior to Your Surgery.                 Do not use any chewable tobacco products for at least 6 hours prior to                 surgery.  ____  5.  Bring all medications with you on the day of surgery if instructed.   __X__  6.  Notify your doctor if there is any change in your medical condition      (cold, fever, infections).     Do not wear jewelry, make-up, hairpins, clips or nail polish. Do not wear lotions, powders, or perfumes.  Do not shave 48 hours prior to surgery. Men may shave face and neck. Do not bring valuables to the hospital.    [facility] is not responsible for any belongings or valuables.  Contacts,  dentures/partials or body piercings may not be worn into surgery. Bring a case for your contacts, glasses or hearing aids, a denture cup will be supplied. Leave your suitcase in the car. After surgery it may be brought to your room. For patients admitted to the hospital, discharge time is determined by your treatment team.   Patients discharged the day of surgery will not be allowed to drive home.   Please read over the following fact sheets that you were given:   MRSA Information  __X__ Take these medicines the morning of surgery with A SIP OF WATER:     1. amLODipine (NORVASC) 5 MG tablet  2. cetirizine (ZYRTEC) 10 MG tablet  3. fenofibrate micronized (LOFIBRA) 134 MG capsule  4. tiotropium (SPIRIVA HANDIHALER) 18  MCG inhalation capsule  5.  6.  ____ Fleet Enema (as directed)   __X__ Use CHG Soap/SAGE wipes as directed  _ X___ Use inhalers on the day of surgery. Also bring the inhaler with you to the hospital on the morning of surgery.  __X__ Stop metformin/Janumet/Farxiga 2 days prior to surgery    ____ Take 1/2 of usual insulin dose the night before surgery. No insulin the morning          of surgery.   __X__ Stop Blood Thinners Coumadin/Plavix/Xarelto/Pleta/Pradaxa/Eliquis/Effient/Aspirin TODAY  __X__ Stop Anti-inflammatories 7 days before surgery such as Advil, Ibuprofen, Motrin, BC or Goodies Powder, Naprosyn, Naproxen, Aleve, Aspirin, Meloxicam. May take Tylenol if needed for pain or discomfort.   __X__ Stop all herbal supplements, fish oil or vitamin E until after surgery.TODAY  ____ Bring C-Pap to the hospital.

## 2018-01-03 NOTE — Pre-Procedure Instructions (Signed)
AS INSTRUCTED BY DR Gildardo Griffes, EKG CALLED AND FAXED TO DR [place]. ALSO NOTIFIED AND FYI TO AMY AT DR Cherrie Gauze

## 2018-01-04 DIAGNOSIS — F1020 Alcohol dependence, uncomplicated: Secondary | ICD-10-CM | POA: Diagnosis not present

## 2018-01-04 DIAGNOSIS — I480 Paroxysmal atrial fibrillation: Secondary | ICD-10-CM | POA: Diagnosis not present

## 2018-01-04 DIAGNOSIS — E785 Hyperlipidemia, unspecified: Secondary | ICD-10-CM | POA: Diagnosis not present

## 2018-01-04 DIAGNOSIS — J42 Unspecified chronic bronchitis: Secondary | ICD-10-CM | POA: Diagnosis not present

## 2018-01-04 DIAGNOSIS — R0602 Shortness of breath: Secondary | ICD-10-CM | POA: Diagnosis not present

## 2018-01-04 DIAGNOSIS — R55 Syncope and collapse: Secondary | ICD-10-CM | POA: Diagnosis not present

## 2018-01-04 DIAGNOSIS — I10 Essential (primary) hypertension: Secondary | ICD-10-CM | POA: Diagnosis not present

## 2018-01-06 NOTE — Pre-Procedure Instructions (Signed)
CLEARANCE NOTE FROM 01/04/18 0N CHART

## 2018-01-10 ENCOUNTER — Encounter: Payer: Self-pay | Admitting: *Deleted

## 2018-01-10 ENCOUNTER — Ambulatory Visit: Payer: PPO | Admitting: Anesthesiology

## 2018-01-10 ENCOUNTER — Ambulatory Visit
Admission: RE | Admit: 2018-01-10 | Discharge: 2018-01-10 | Disposition: A | Discharge status: Home / Self Care | Payer: PPO | Source: Ambulatory Visit | Attending: Radiation Oncology | Admitting: Radiation Oncology

## 2018-01-10 ENCOUNTER — Ambulatory Visit
Admission: RE | Admit: 2018-01-10 | Discharge: 2018-01-10 | Disposition: A | Discharge status: Home / Self Care | Payer: PPO | Source: Ambulatory Visit | Attending: Urology | Admitting: Urology

## 2018-01-10 ENCOUNTER — Other Ambulatory Visit: Payer: Self-pay

## 2018-01-10 ENCOUNTER — Encounter
Admission: RE | Disposition: A | Discharge status: Home / Self Care | Payer: Self-pay | Source: Ambulatory Visit | Attending: Urology

## 2018-01-10 DIAGNOSIS — M1990 Unspecified osteoarthritis, unspecified site: Secondary | ICD-10-CM | POA: Insufficient documentation

## 2018-01-10 DIAGNOSIS — K219 Gastro-esophageal reflux disease without esophagitis: Secondary | ICD-10-CM | POA: Diagnosis not present

## 2018-01-10 DIAGNOSIS — C61 Malignant neoplasm of prostate: Secondary | ICD-10-CM | POA: Insufficient documentation

## 2018-01-10 DIAGNOSIS — F329 Major depressive disorder, single episode, unspecified: Secondary | ICD-10-CM | POA: Insufficient documentation

## 2018-01-10 DIAGNOSIS — J449 Chronic obstructive pulmonary disease, unspecified: Secondary | ICD-10-CM | POA: Insufficient documentation

## 2018-01-10 DIAGNOSIS — Z79899 Other long term (current) drug therapy: Secondary | ICD-10-CM | POA: Diagnosis not present

## 2018-01-10 DIAGNOSIS — F419 Anxiety disorder, unspecified: Secondary | ICD-10-CM | POA: Insufficient documentation

## 2018-01-10 DIAGNOSIS — I10 Essential (primary) hypertension: Secondary | ICD-10-CM | POA: Diagnosis not present

## 2018-01-10 HISTORY — PX: CYSTOSCOPY: SHX5120

## 2018-01-10 HISTORY — PX: RADIOACTIVE SEED IMPLANT: SHX5150

## 2018-01-10 SURGERY — INSERTION, RADIATION SOURCE, PROSTATE
Anesthesia: General | Site: Prostate | Wound class: Clean Contaminated

## 2018-01-10 MED ORDER — ONDANSETRON HCL 4 MG/2ML IJ SOLN
INTRAMUSCULAR | Status: DC | PRN
  Administered 2018-01-10: 4 mg via INTRAVENOUS

## 2018-01-10 MED ORDER — OXYCODONE HCL 5 MG PO TABS: 5.0000 mg | ORAL_TABLET | Freq: Once | ORAL | Status: DC | PRN

## 2018-01-10 MED ORDER — TAMSULOSIN HCL 0.4 MG PO CAPS: 0.4000 mg | ORAL_CAPSULE | Freq: Every day | ORAL | 0 refills | Status: DC

## 2018-01-10 MED ORDER — DEXAMETHASONE SODIUM PHOSPHATE 10 MG/ML IJ SOLN
INTRAMUSCULAR | Status: DC | PRN
  Administered 2018-01-10: 10 mg via INTRAVENOUS

## 2018-01-10 MED ORDER — PROMETHAZINE HCL 25 MG/ML IJ SOLN: 6.2500 mg | INTRAMUSCULAR | Status: DC | PRN

## 2018-01-10 MED ORDER — HYDROCODONE-ACETAMINOPHEN 5-325 MG PO TABS: 1.0000 | ORAL_TABLET | Freq: Four times a day (QID) | ORAL | 0 refills | Status: DC | PRN

## 2018-01-10 MED ORDER — PROPOFOL 10 MG/ML IV BOLUS
INTRAVENOUS | Status: AC
  Filled 2018-01-10: qty 20

## 2018-01-10 MED ORDER — MIDAZOLAM HCL 2 MG/2ML IJ SOLN
INTRAMUSCULAR | Status: AC
  Filled 2018-01-10: qty 2

## 2018-01-10 MED ORDER — FAMOTIDINE 20 MG PO TABS
ORAL_TABLET | ORAL | Status: AC
  Administered 2018-01-10: 20 mg via ORAL
  Filled 2018-01-10: qty 1

## 2018-01-10 MED ORDER — BACITRACIN 500 UNIT/GM EX OINT
TOPICAL_OINTMENT | CUTANEOUS | Status: DC | PRN
  Administered 2018-01-10: 1 via TOPICAL

## 2018-01-10 MED ORDER — FENTANYL CITRATE (PF) 100 MCG/2ML IJ SOLN
INTRAMUSCULAR | Status: AC
  Filled 2018-01-10: qty 2

## 2018-01-10 MED ORDER — MIDAZOLAM HCL 2 MG/2ML IJ SOLN
INTRAMUSCULAR | Status: DC | PRN
  Administered 2018-01-10: 2 mg via INTRAVENOUS

## 2018-01-10 MED ORDER — FAMOTIDINE 20 MG PO TABS
20.0000 mg | ORAL_TABLET | Freq: Once | ORAL | Status: AC
  Administered 2018-01-10: 20 mg via ORAL

## 2018-01-10 MED ORDER — BACITRACIN ZINC 500 UNIT/GM EX OINT
TOPICAL_OINTMENT | CUTANEOUS | Status: AC
  Filled 2018-01-10: qty 28.35

## 2018-01-10 MED ORDER — PROPOFOL 10 MG/ML IV BOLUS
INTRAVENOUS | Status: DC | PRN
  Administered 2018-01-10: 150 mg via INTRAVENOUS

## 2018-01-10 MED ORDER — SUGAMMADEX SODIUM 200 MG/2ML IV SOLN
INTRAVENOUS | Status: DC | PRN
  Administered 2018-01-10: 146.2 mg via INTRAVENOUS

## 2018-01-10 MED ORDER — FENTANYL CITRATE (PF) 100 MCG/2ML IJ SOLN: 25.0000 ug | INTRAMUSCULAR | Status: DC | PRN

## 2018-01-10 MED ORDER — PHENYLEPHRINE HCL 10 MG/ML IJ SOLN
INTRAMUSCULAR | Status: DC | PRN
  Administered 2018-01-10 (×2): 100 ug via INTRAVENOUS

## 2018-01-10 MED ORDER — LIDOCAINE HCL (CARDIAC) PF 100 MG/5ML IV SOSY
PREFILLED_SYRINGE | INTRAVENOUS | Status: DC | PRN
  Administered 2018-01-10: 100 mg via INTRAVENOUS

## 2018-01-10 MED ORDER — SUCCINYLCHOLINE CHLORIDE 20 MG/ML IJ SOLN
INTRAMUSCULAR | Status: DC | PRN
  Administered 2018-01-10: 100 mg via INTRAVENOUS

## 2018-01-10 MED ORDER — LACTATED RINGERS IV SOLN
INTRAVENOUS | Status: DC
  Administered 2018-01-10 (×3): via INTRAVENOUS

## 2018-01-10 MED ORDER — CIPROFLOXACIN HCL 500 MG PO TABS: 500.0000 mg | ORAL_TABLET | Freq: Two times a day (BID) | ORAL | 0 refills | Status: DC

## 2018-01-10 MED ORDER — DOCUSATE SODIUM 100 MG PO CAPS: 100.0000 mg | ORAL_CAPSULE | Freq: Two times a day (BID) | ORAL | 0 refills | Status: DC | PRN

## 2018-01-10 MED ORDER — OXYCODONE HCL 5 MG/5ML PO SOLN: 5.0000 mg | Freq: Once | ORAL | Status: DC | PRN

## 2018-01-10 MED ORDER — FENTANYL CITRATE (PF) 100 MCG/2ML IJ SOLN
INTRAMUSCULAR | Status: DC | PRN
  Administered 2018-01-10: 50 ug via INTRAVENOUS

## 2018-01-10 MED ORDER — MEPERIDINE HCL 50 MG/ML IJ SOLN: 6.2500 mg | INTRAMUSCULAR | Status: DC | PRN

## 2018-01-10 MED ORDER — ROCURONIUM BROMIDE 100 MG/10ML IV SOLN
INTRAVENOUS | Status: DC | PRN
  Administered 2018-01-10: 40 mg via INTRAVENOUS
  Administered 2018-01-10: 10 mg via INTRAVENOUS

## 2018-01-10 SURGICAL SUPPLY — 25 items
BAG URINE DRAINAGE (UROLOGICAL SUPPLIES) ×3 IMPLANT
BLADE CLIPPER SURG (BLADE) ×3 IMPLANT
CATH COUDE FOLEY 2W 5CC 16FR (CATHETERS) ×3 IMPLANT
CATH FOL 2WAY LX 16X5 (CATHETERS) ×3 IMPLANT
DRAPE INCISE 23X17 IOBAN STRL (DRAPES) ×1
DRAPE INCISE IOBAN 23X17 STRL (DRAPES) ×2 IMPLANT
DRAPE SHEET LG 3/4 BI-LAMINATE (DRAPES) ×3 IMPLANT
DRAPE TABLE BACK 80X90 (DRAPES) ×3 IMPLANT
DRAPE UNDER BUTTOCK W/FLU (DRAPES) ×3 IMPLANT
DRSG TELFA 3X8 NADH (GAUZE/BANDAGES/DRESSINGS) ×3 IMPLANT
GLOVE BIO SURGEON STRL SZ 6.5 (GLOVE) ×3 IMPLANT
GLOVE BIO SURGEON STRL SZ7.5 (GLOVE) ×6 IMPLANT
GOWN STRL REUS W/ TWL LRG LVL3 (GOWN DISPOSABLE) ×4 IMPLANT
GOWN STRL REUS W/ TWL XL LVL3 (GOWN DISPOSABLE) ×2 IMPLANT
GOWN STRL REUS W/TWL LRG LVL3 (GOWN DISPOSABLE) ×2
GOWN STRL REUS W/TWL XL LVL3 (GOWN DISPOSABLE) ×1
IV NS 1000ML (IV SOLUTION) ×1
IV NS 1000ML BAXH (IV SOLUTION) ×2 IMPLANT
KIT TURNOVER CYSTO (KITS) ×3 IMPLANT
PACK CYSTO AR (MISCELLANEOUS) ×3 IMPLANT
SET CYSTO W/LG BORE CLAMP LF (SET/KITS/TRAYS/PACK) ×3 IMPLANT
SURGILUBE 2OZ TUBE FLIPTOP (MISCELLANEOUS) ×3 IMPLANT
SYR 10ML LL (SYRINGE) ×3 IMPLANT
SYRINGE IRR TOOMEY STRL 70CC (SYRINGE) ×3 IMPLANT
WATER STERILE IRR 1000ML POUR (IV SOLUTION) ×3 IMPLANT

## 2018-01-10 NOTE — Anesthesia Postprocedure Evaluation (Signed)
Anesthesia Post Note  Patient: Carl Allen  Procedure(s) Performed: RADIOACTIVE SEED IMPLANT/BRACHYTHERAPY IMPLANT (N/A Prostate) CYSTOSCOPY (N/A Bladder)  Patient location during evaluation: PACU Anesthesia Type: General Level of consciousness: awake and alert and oriented Pain management: pain level controlled Vital Signs Assessment: post-procedure vital signs reviewed and stable Respiratory status: spontaneous breathing, nonlabored ventilation and respiratory function stable Cardiovascular status: blood pressure returned to baseline and stable Postop Assessment: no signs of nausea or vomiting Anesthetic complications: no     Last Vitals:  Vitals:   01/10/18 0920 01/10/18 0942  BP: 129/87 (!) 161/81  Pulse: 66 72  Resp: (!) 23 16  Temp:  (!) 36.2 C  SpO2: 97% 98%    Last Pain:  Vitals:   01/10/18 0942  TempSrc: Temporal  PainSc: 0-No pain                 Patrice Moates

## 2018-01-10 NOTE — Discharge Instructions (Addendum)
Brachytherapy for Prostate Cancer Brachytherapy for prostate cancer is radiation treatment that is placed inside of the prostate (prostate gland). There are several types of brachytherapy:  Low-dose rate (LDR) therapy. This may involve temporary implants or permanent radioactive seed or pellet implants. The radiation does not travel far from the prostate, which means that healthy, noncancerous tissues around the prostate receive only a small dose of radiation. This helps to protect those tissues from injury. This type of treatment may be followed by a course of external beam radiation. ? Temporary low-dose implants are left in the prostate for 1-7 days. The implants are needles, applicators, or thin, plastic tubes (catheters) that contain radioactive material. You will need to stay in the hospital while the implant is in place. ? Permanent low-dose implants (seeds or pellets) are injected into the prostate, and they work for up to one year after they are inserted. They are left in place and are not removed.  High-dose rate (HDR) therapy. This is given through needles, applicators, or catheters that contain radioactive material. The tubes are removed after treatment, and no radiation is left in the prostate. This type of treatment may be followed by a course of external beam radiation.  Tell a health care provider about:  Any allergies you have.  All medicines you are taking, including vitamins, herbs, eye drops, creams, and over-the-counter medicines.  Any problems you or family members have had with anesthetic medicines.  Any surgeries you have had.  Any blood disorders you have.  Any medical conditions you have. What are the risks? Generally, this is a safe procedure. However, problems may occur, including:  Inflammation of the rectum.  Problems getting or keeping an erection (erectile dysfunction).  Trouble urinating.  Diarrhea.  Bleeding.  Loss of bowel control.  What happens  before the procedure? Staying hydrated Follow instructions from your health care provider about hydration, which may include:  Up to 2 hours before the procedure - you may continue to drink clear liquids, such as water, clear fruit juice, black coffee, and plain tea.  Eating and drinking Follow instructions from your health care provider about eating and drinking, which may include:  8 hours before the procedure - stop eating heavy meals or foods such as meat, fried foods, or fatty foods.  6 hours before the procedure - stop eating light meals or foods, such as toast or cereal.  6 hours before the procedure - stop drinking milk or drinks that contain milk.  2 hours before the procedure - stop drinking clear liquids.  Medicines  Ask your health care provider about: ? Changing or stopping your regular medicines. This is especially important if you are taking diabetes medicines or blood thinners. ? Taking medicines such as aspirin and ibuprofen. These medicines can thin your blood. Do not take these medicines before your procedure if your health care provider instructs you not to.  You may be given antibiotic medicine to help prevent infection. General instructions  Plan to have someone take you home from the hospital or clinic.  If you will be going home right after the procedure, plan to have someone with you for 24 hours.  You may have imaging tests done, including an ultrasound, CT scan, or MRI.  You may have blood tests done.  You may have a test to check the electrical signals in your heart (electrocardiogram).  You may need to take medicine to clean out your bowel (bowel prep). What happens during the procedure?  To  lower your risk of infection: ? Your health care team will wash or sanitize their hands. ? Your skin will be washed with soap. ? Hair may be removed from the surgical area.  An IV will be inserted into one of your veins.  You will be given one or more of  the following: ? A medicine to help you relax (sedative). ? A medicine to numb the area (local anesthetic). ? A medicine to make you fall asleep (general anesthetic).  You may have a thin, plastic tube (catheter) inserted to drain your bladder.  If you are receiving brachytherapy with implants: ? A needle, applicator, or catheter will be inserted into the prostate. It will be inserted through a body cavity, such as the rectum, or through the tissue between the testicles and the anus (perineum). ? An X-ray, ultrasound, MRI, or CT scan will be used to guide the catheter or applicator toward the prostate. ? Radioactive seeds, wires, or ribbons will be fed through the catheter or applicator. ? If the high-dose method is used: ? The radioactive wires or ribbons will be left in for a few minutes and then removed. ? Once the treatment is finished, the catheter or applicator will be removed. ? If the low-dose method is used, the implant will stay in place for 1-7 days. ? You will remain in the hospital while the implant is in place. ? Once the treatment is finished, the radioactive material and catheter will be removed.  If you are receiving permanent, low-dose brachytherapy: ? Small, radioactive seeds or pellets will be injected into your prostate. This may be done through a catheter, needle, or applicator. ? The catheter or applicator will be removed, leaving the seeds in the prostate. The procedure may vary among health care providers and hospitals. What happens after the procedure?  Your blood pressure, heart rate, breathing rate, and blood oxygen level will be monitored until the medicines you were given have worn off.  Do not drive for 24 hours if you were given a sedative. Summary  Brachytherapy for prostate cancer is radiation treatment placed inside of the prostate (prostate gland).  There are several types of brachytherapy for prostate cancer, including low-dose temporary treatment,  low-dose permanent treatment, and high-dose temporary treatment.  Temporary low-dose implants are left in the prostate for 1-7 days.  Permanent low-dose implants are injected into the prostate and left in place. They work for up to one year after they are inserted.  Permanent high-dose therapy is given through tubes that contain radioactive material. The tubes are removed after treatment, and no radiation is left in the prostate. This information is not intended to replace advice given to you by your health care provider. Make sure you discuss any questions you have with your health care provider. Document Released: 10/26/2005 Document Revised: 05/27/2016 Document Reviewed: 05/27/2016 Elsevier Interactive Patient Education  2017 [place] for Prostate Cancer, Care After Refer to this sheet in the next few weeks. These instructions provide you with information on caring for yourself after your procedure. Your health care provider may also give you more specific instructions. Your treatment has been planned according to current medical practices, but problems sometimes occur. Call your health care provider if you have any problems or questions after your procedure. What can I expect after the procedure? The area behind the scrotum will probably be tender and bruised. For a short period of time you may have:  Difficulty passing urine. You  may need a catheter for a few days to a month.  Blood in the urine or semen.  A feeling of constipation because of prostate swelling.  Frequent feeling of an urgent need to urinate.  For a long period of time you may have:  Inflammation of the rectum. This happens in about 2% of people who have the procedure.  Erection problems. These vary with age and occur in about 15-40% of men.  Difficulty urinating. This is caused by scarring in the urethra.  Diarrhea.  Follow these instructions at home:  Take medicines only as  directed by your health care provider.  You will probably have a catheter in your bladder for several days. You will have blood in the urine bag and should drink a lot of fluids to keep it a light red color.  Keep all follow-up visits as directed by your health care provider. If you have a catheter, it will be removed during one of these visits.  Try not to sit directly on the area behind the scrotum. A soft cushion can decrease the discomfort. Ice packs may also be helpful for the discomfort. Do not put ice directly on the skin.  Shower and wash the area behind the scrotum gently. Do not sit in a tub.  If you have had the brachytherapy that uses the seeds, limit your close contact with children and pregnant women for 2 months because of the radiation still in the prostate. After that period of time, the levels drop off quickly. Get help right away if:  You have a fever.  You have chills.  You have shortness of breath.  You have chest pain.  You have thick blood, like tomato juice, in the urine bag.  Your catheter is blocked so urine cannot get into the bag. Your bladder area or lower abdomen may be swollen.  There is excessive bleeding from your rectum. It is normal to have a little blood mixed with your stool.  There is severe discomfort in the treated area that does not go away with pain medicine.  You have abdominal discomfort.  You have severe nausea or vomiting.  You develop any new or unusual symptoms. This information is not intended to replace advice given to you by your health care provider. Make sure you discuss any questions you have with your health care provider. Document Released: 06/20/2010 Document Revised: 10/30/2015 Document Reviewed: 11/08/2012 Elsevier Interactive Patient Education  2017 [place]   1) The drugs that you were given will stay in your system until tomorrow so for the next 24 hours you  should not:  A) Drive an automobile B) Make any legal decisions C) Drink any alcoholic beverage   2) You may resume regular meals tomorrow.  Today it is better to start with liquids and gradually work up to solid foods.  You may eat anything you prefer, but it is better to start with liquids, then soup and crackers, and gradually work up to solid foods.   3) Please notify your doctor immediately if you have any unusual bleeding, trouble breathing, redness and pain at the surgery site, drainage, fever, or pain not relieved by medication.    4) Additional Instructions:        Please contact your physician with any problems or Same Day Surgery at [phone], Monday through Friday 6 am to 4 pm, or [place] at [facility] number at [phone].

## 2018-01-10 NOTE — Op Note (Signed)
Preoperative diagnosis: Adenocarcinoma of the prostate   Postoperative diagnosis: Same   Procedure: I-125 prostate seed implantation, cystoscopy  Surgeon: Hollice Espy M.D. ,   Radiation Oncologist: Lavena Stanford, M.D.   Anesthesia: General  Drains: none  Complications: none  Indications: prostate cancer  Procedure: Patient was brought to operating suite and placement table in the supine position. At this time, a universal timeout protocol was performed, all team members were identified, Venodyne boots are placed, and he was administered IV Ancef in the preoperative period. He was placed in lithotomy position and prepped and draped in usual manner. Radiation oncology department placed a transrectal ultrasound probe anchoring stand/ grid and aligned with previous imaging from the volume study. Foley catheter was inserted without difficulty.  All needle passage was done with real-time transrectal ultrasound guidance in both the transverse and sagittal plains in order to achieve the desired preplanned position. A total of 28 needles were placed.  77 active seeds were implanted. The Foley catheter was removed and a rigid cystoscopy failed to show any seeds outside the prostate without evidence of trauma to the urethral, prostatic fossa, or bladder.  The bladder was drained.  A fluoroscopic image was then obtained showing excellent distrubution of the brachytherapy seeds.  Each seed was counted and counts were correct.    The patient was then repositioned in the supine position, reversed from anesthesia, and taken to the PACU in stable condition.

## 2018-01-10 NOTE — Progress Notes (Signed)
Radiation Oncology Operative Note  Name: Carl Allen   Date:   10/15/2017 MRN:  [id number] DOB: 05-Apr-1946    This 72 y.o. male presents to thehospital today for I-125 interstitial implant for mostly Gleason 6 adenocarcinoma the prostate  REFERRING PROVIDER: No ref. provider found  HPI: patient is a 72 year old male who underwent I-125 interstitial implant for Gleason 6 (3+3) adenocarcinoma prostate presenting the PSA of 5. He is undergone volume study seen today for I-125 interstitial implant..  COMPLICATIONS OF TREATMENT: none  FOLLOW UP COMPLIANCE: keeps appointments   PHYSICAL EXAM:  BP (!) 161/81   Pulse 72   Temp (!) 97.1 F (36.2 C) (Temporal)   Resp 16   Ht 5' 7.5" (1.715 m)   Wt 161 lb 1.6 oz (73.1 kg)   SpO2 98%   BMI 24.86 kg/m  Well-developed well-nourished patient in NAD. HEENT reveals PERLA, EOMI, discs not visualized.  Oral cavity is clear. No oral mucosal lesions are identified. Neck is clear without evidence of cervical or supraclavicular adenopathy. Lungs are clear to A&P. Cardiac examination is essentially unremarkable with regular rate and rhythm without murmur rub or thrill. Abdomen is benign with no organomegaly or masses noted. Motor sensory and DTR levels are equal and symmetric in the upper and lower extremities. Cranial nerves II through XII are grossly intact. Proprioception is intact. No peripheral adenopathy or edema is identified. No motor or sensory levels are noted. Crude visual fields are within normal range.  RADIOLOGY RESULTS: ultrasound used as well as plain films for seed count  PLAN: patient was taken to the operating room and general anesthesia was administered. Legs were immobilized in stirrups and patient was positioned in the exact same proportions as original volume study. Patient was prepped and Foley catheter was placed. Ultrasound guidance identified the prostate and recreated the original set up as per treatment planning volume study.  28 needles were placed under ultrasound guidance with PVCs delivered to the prostate volume. After completion of procedure cystoscopy was performed by urology and no evidence of seeds in the bladder were noted. Patient tolerated the procedure extremely well. Initial plain film as doublecheck identified 77 seeds in the prostate. Patient has followup appointment in one month for CT scan for quality assurance will be performed.     Noreene Filbert, MD

## 2018-01-10 NOTE — Interval H&P Note (Signed)
History and Physical Interval Note:  01/10/2018 7:31 AM  Carl Allen  has presented today for surgery, with the diagnosis of Prostate cancer  The various methods of treatment have been discussed with the patient and family. After consideration of risks, benefits and other options for treatment, the patient has consented to  Procedure(s): RADIOACTIVE SEED IMPLANT/BRACHYTHERAPY IMPLANT (N/A) as a surgical intervention .  The patient's history has been reviewed, patient examined, no change in status, stable for surgery.  I have reviewed the patient's chart and labs.  Questions were answered to the patient's satisfaction.    RRR CTAB  Hollice Espy

## 2018-01-10 NOTE — OR Nursing (Addendum)
Total 3 voids - 163ml; 200 ml, and 275 ml, post 3rd void bladder scan = 381 ml.  Discussed with Dr. Erlene Quan in [facility] room (via [place]).  Advises offer patient to go home with foley cath, or can go home without foley cath with possibility of having to return to hospital ED.

## 2018-01-10 NOTE — Anesthesia Procedure Notes (Signed)
Procedure Name: Intubation Date/Time: 01/10/2018 8:00 AM Performed by: Nelda Marseille, CRNA Pre-anesthesia Checklist: Patient identified, Patient being monitored, Timeout performed, Emergency Drugs available and Suction available Patient Re-evaluated:Patient Re-evaluated prior to induction Oxygen Delivery Method: Circle system utilized Preoxygenation: Pre-oxygenation with 100% oxygen Induction Type: IV induction Ventilation: Mask ventilation without difficulty Laryngoscope Size: Mac and 3 Grade View: Grade II Tube type: Oral Tube size: 7.5 mm Number of attempts: 1 Airway Equipment and Method: Stylet Placement Confirmation: ETT inserted through vocal cords under direct vision,  positive ETCO2 and breath sounds checked- equal and bilateral Secured at: 21 cm Tube secured with: Tape Dental Injury: Teeth and Oropharynx as per pre-operative assessment

## 2018-01-10 NOTE — Anesthesia Post-op Follow-up Note (Signed)
Anesthesia QCDR form completed.        

## 2018-01-10 NOTE — Progress Notes (Signed)
Voided 100cc light pink tinged urine

## 2018-01-10 NOTE — Transfer of Care (Signed)
Immediate Anesthesia Transfer of Care Note  Patient: Carl Allen  Procedure(s) Performed: RADIOACTIVE SEED IMPLANT/BRACHYTHERAPY IMPLANT (N/A Prostate) CYSTOSCOPY (N/A Bladder)  Patient Location: PACU  Anesthesia Type:General  Level of Consciousness: awake, alert  and oriented  Airway & Oxygen Therapy: Patient Spontanous Breathing and Patient connected to face mask oxygen  Post-op Assessment: Report given to RN and Post -op Vital signs reviewed and stable  Post vital signs: Reviewed and stable  Last Vitals:  Vitals Value Taken Time  BP 122/85 01/10/2018  8:51 AM  Temp 36.2 C 01/10/2018  8:50 AM  Pulse 71 01/10/2018  8:52 AM  Resp 13 01/10/2018  8:52 AM  SpO2 100 % 01/10/2018  8:52 AM  Vitals shown include unvalidated device data.  Last Pain:  Vitals:   01/10/18 0850  TempSrc:   PainSc: 0-No pain         Complications: No apparent anesthesia complications

## 2018-01-10 NOTE — Anesthesia Preprocedure Evaluation (Signed)
Anesthesia Evaluation  Patient identified by MRN, date of birth, ID band Patient awake    Reviewed: Allergy & Precautions, NPO status , Patient's Chart, lab work & pertinent test results  History of Anesthesia Complications Negative for: history of anesthetic complications  Airway Mallampati: III  TM Distance: >3 FB Neck ROM: Full    Dental  (+) Upper Dentures, Missing, Poor Dentition   Pulmonary neg sleep apnea, COPD, Current Smoker,    breath sounds clear to auscultation- rhonchi (-) wheezing      Cardiovascular Exercise Tolerance: Good hypertension, Pt. on medications (-) CAD, (-) Past MI, (-) Cardiac Stents and (-) CABG  Rhythm:Regular Rate:Normal - Systolic murmurs and - Diastolic murmurs    Neuro/Psych PSYCHIATRIC DISORDERS Anxiety Depression negative neurological ROS     GI/Hepatic Neg liver ROS, GERD  ,  Endo/Other  negative endocrine ROSneg diabetes  Renal/GU negative Renal ROS     Musculoskeletal  (+) Arthritis ,   Abdominal (+) - obese,   Peds  Hematology negative hematology ROS (+)   Anesthesia Other Findings Past Medical History: No date: Allergy No date: Anxiety     Comment:  not often No date: Arthritis     Comment:  fingers, knees No date: Cancer ([place]) No date: Cataract No date: Chronic alcoholism (HCC) No date: COPD (chronic obstructive pulmonary disease) (HCC) No date: Depression     Comment:  pt states sometimes No date: Dyspnea No date: Emphysema of lung (HCC) No date: GERD (gastroesophageal reflux disease)     Comment:  pt states every now and then No date: Hypertension No date: Hypertriglyceridemia   Reproductive/Obstetrics                             Anesthesia Physical Anesthesia Plan  ASA: II  Anesthesia Plan: General   Post-op Pain Management:    Induction: Intravenous  PONV Risk Score and Plan: 0 and Ondansetron and Midazolam  Airway  Management Planned: Oral ETT  Additional Equipment:   Intra-op Plan:   Post-operative Plan: Extubation in OR  Informed Consent: I have reviewed the patients History and Physical, chart, labs and discussed the procedure including the risks, benefits and alternatives for the proposed anesthesia with the patient or authorized representative who has indicated his/her understanding and acceptance.   Dental advisory given  Plan Discussed with: CRNA and Anesthesiologist  Anesthesia Plan Comments:         Anesthesia Quick Evaluation

## 2018-01-10 NOTE — OR Nursing (Signed)
Pt was given the option to go home with a foley catheter or take a chance of coming back to the ED if unable to void or empty bladder sufficiently. Pt has chosen to go home without catheter. signs and symptoms of bladder distension and urine retention discussed pt verbalized understanding.

## 2018-01-10 NOTE — OR Nursing (Signed)
POST VOID BLADDER SCAN @ 10:45 ML- 282 ML

## 2018-01-17 ENCOUNTER — Ambulatory Visit: Payer: PPO

## 2018-01-18 ENCOUNTER — Encounter: Payer: PPO | Admitting: Family Medicine

## 2018-01-26 ENCOUNTER — Ambulatory Visit (INDEPENDENT_AMBULATORY_CARE_PROVIDER_SITE_OTHER): Payer: PPO

## 2018-01-26 VITALS — BP 122/74 | HR 88 | Temp 98.0°F | Resp 16 | Ht 67.0 in | Wt 160.1 lb

## 2018-01-26 DIAGNOSIS — C61 Malignant neoplasm of prostate: Secondary | ICD-10-CM

## 2018-01-26 DIAGNOSIS — I10 Essential (primary) hypertension: Secondary | ICD-10-CM

## 2018-01-26 DIAGNOSIS — E7800 Pure hypercholesterolemia, unspecified: Secondary | ICD-10-CM | POA: Diagnosis not present

## 2018-01-26 DIAGNOSIS — Z0000 Encounter for general adult medical examination without abnormal findings: Secondary | ICD-10-CM

## 2018-01-26 DIAGNOSIS — R5383 Other fatigue: Secondary | ICD-10-CM

## 2018-01-26 LAB — URINALYSIS, ROUTINE W REFLEX MICROSCOPIC
Bilirubin, UA: NEGATIVE
Glucose, UA: NEGATIVE
KETONES UA: NEGATIVE
Leukocytes, UA: NEGATIVE
NITRITE UA: NEGATIVE
Protein, UA: NEGATIVE
SPEC GRAV UA: 1.015 (ref 1.005–1.030)
Urobilinogen, Ur: 0.2 mg/dL (ref 0.2–1.0)
pH, UA: 7 (ref 5.0–7.5)

## 2018-01-26 LAB — MICROSCOPIC EXAMINATION: BACTERIA UA: NONE SEEN

## 2018-01-26 NOTE — Progress Notes (Signed)
Subjective:   Carl Allen is a 72 y.o. male who presents for Medicare Annual/Subsequent preventive examination.  Review of Systems:     Cardiac Risk Factors include: hypertension;male gender;advanced age (>41men, >85 women);dyslipidemia     Objective:    Vitals: BP 122/74 (BP Location: Left Arm, Patient Position: Sitting)   Pulse 88   Temp 98 F (36.7 C) (Temporal)   Resp 16   Ht 5\' 7"  (1.702 m)   Wt 160 lb 1.6 oz (72.6 kg)   BMI 25.08 kg/m   Body mass index is 25.08 kg/m.  Advanced Directives 01/26/2018 01/03/2018 12/13/2017 10/07/2017 01/12/2017 01/06/2017 01/07/2015  Does Patient Have a Medical Advance Directive? No No No No No No No  Would patient like information on creating a medical advance directive? Yes (MAU/Ambulatory/Procedural Areas - Information given) No - Patient declined No - Patient declined No - Patient declined - Yes (MAU/Ambulatory/Procedural Areas - Information given) Yes - Educational materials given    Tobacco Social History   Tobacco Use  Smoking Status Current Every Day Smoker  . Packs/day: 1.50  Smokeless Tobacco Never Used     Ready to quit: No Counseling given: Yes   Clinical Intake:  Pre-visit preparation completed: Yes  Pain : No/denies pain     Nutritional Status: BMI 25 -29 Overweight Nutritional Risks: None Diabetes: No  How often do you need to have someone help you when you read instructions, pamphlets, or other written materials from your doctor or pharmacy?: 1 - Never What is the last grade level you completed in school?: college - bachelors   Interpreter Needed?: No  Information entered by :: Tiffany Hill,LPN   Past Medical History:  Diagnosis Date  . Allergy   . Anxiety    not often  . Arthritis    fingers, knees  . Cancer ([place])   . Cataract   . Chronic alcoholism ([place])   . COPD (chronic obstructive pulmonary disease) ([place])   . Depression    pt states sometimes  . Dyspnea   . Emphysema of lung ([place])   . GERD  (gastroesophageal reflux disease)    pt states every now and then  . Hypertension   . Hypertriglyceridemia    Past Surgical History:  Procedure Laterality Date  . CATARACT EXTRACTION, BILATERAL  2014  . COLONOSCOPY  2012  . CYSTOSCOPY N/A 01/10/2018   Procedure: CYSTOSCOPY;  Surgeon: Hollice Espy, MD;  Location: [facility] ORS;  Service: Urology;  Laterality: N/A;  . RADIOACTIVE SEED IMPLANT N/A 01/10/2018   Procedure: RADIOACTIVE SEED IMPLANT/BRACHYTHERAPY IMPLANT;  Surgeon: Hollice Espy, MD;  Location: [facility] ORS;  Service: Urology;  Laterality: N/A;  . TONSILLECTOMY    . TONSILLECTOMY AND ADENOIDECTOMY  1952   Family History  Problem Relation Age of Onset  . Arthritis Mother   . Heart disease Mother   . Hearing loss Mother   . Hyperlipidemia Mother   . Hypertension Mother   . Kidney disease Mother   . Miscarriages / [place] Mother   . Vision loss Mother   . Stroke Father   . Heart disease Maternal Grandmother   . Stroke Maternal Grandfather   . Cancer Paternal Grandfather    Social History   Socioeconomic History  . Marital status: Single    Spouse name: Not on file  . Number of children: Not on file  . Years of education: college  . Highest education level: Bachelor's degree (e.g., BA, AB, BS)  Occupational History    Comment:  Retired  Scientific laboratory technician  . Financial resource strain: Not hard at all  . Food insecurity:    Worry: Never true    Inability: Never true  . Transportation needs:    Medical: No    Non-medical: No  Tobacco Use  . Smoking status: Current Every Day Smoker    Packs/day: 1.50  . Smokeless tobacco: Never Used  Substance and Sexual Activity  . Alcohol use: Yes    Alcohol/week: 0.0 standard drinks    Comment: pt is currently trying to quit, pt states unknown how much he drinks a week-possibly about 12 oz of liquor at a time  . Drug use: No  . Sexual activity: Not Currently  Lifestyle  . Physical activity:    Days per week: 0 days     Minutes per session: 0 min  . Stress: Not at all  Relationships  . Social connections:    Talks on phone: More than three times a week    Gets together: More than three times a week    Attends religious service: Never    Active member of club or organization: No    Attends meetings of clubs or organizations: Never    Relationship status: Never married  Other Topics Concern  . Not on file  Social History Narrative  . Not on file    Outpatient Encounter Medications as of 01/26/2018  Medication Sig  . amLODipine (NORVASC) 5 MG tablet Take 1 tablet (5 mg total) by mouth daily.  . benazepril (LOTENSIN) 40 MG tablet Take 1 tablet (40 mg total) by mouth daily.  . cetirizine (ZYRTEC) 10 MG tablet Take 10 mg by mouth daily as needed for allergies.  . fenofibrate micronized (LOFIBRA) 134 MG capsule Take 1 capsule (134 mg total) by mouth daily before breakfast.  . ferrous sulfate 325 (65 FE) MG tablet Take 325 mg by mouth daily with breakfast.  . hydrochlorothiazide (HYDRODIURIL) 25 MG tablet Take 1 tablet (25 mg total) by mouth daily.  . Multiple Vitamins-Minerals (CENTRUM SILVER 50+MEN) TABS Take 1 tablet by mouth daily.  . tamsulosin (FLOMAX) 0.4 MG CAPS capsule Take 1 capsule (0.4 mg total) by mouth daily.  [name] tiotropium (SPIRIVA HANDIHALER) 18 MCG inhalation capsule Place 1 capsule (18 mcg total) into inhaler and inhale every morning.  . ciprofloxacin (CIPRO) 500 MG tablet Take 1 tablet (500 mg total) by mouth 2 (two) times daily. (Patient not taking: Reported on 01/26/2018)  . docusate sodium (COLACE) 100 MG capsule Take 1 capsule (100 mg total) by mouth 2 (two) times daily as needed (take to keep stool soft.). (Patient not taking: Reported on 01/26/2018)  . HYDROcodone-acetaminophen (NORCO/VICODIN) 5-325 MG tablet Take 1-2 tablets by mouth every 6 (six) hours as needed for moderate pain. (Patient not taking: Reported on 01/26/2018)   Facility-Administered Encounter Medications as of 01/26/2018    Medication  . ciprofloxacin (CIPRO) IVPB 400 mg  . sodium phosphate (FLEET) 7-19 GM/118ML enema 1 enema    Activities of Daily Living In your present state of health, do you have any difficulty performing the following activities: 01/26/2018 01/03/2018  Hearing? N N  Vision? N Y  Difficulty concentrating or making decisions? N N  Walking or climbing stairs? N N  Dressing or bathing? N -  Doing errands, shopping? N Y  Conservation officer, nature and eating ? N -  Using the Toilet? N -  In the past six months, have you accidently leaked urine? N -  Do you have problems  with loss of bowel control? N -  Managing your Medications? N -  Managing your Finances? N -  Housekeeping or managing your Housekeeping? N -  Some recent data might be hidden    Patient Care Team: [name], MD as PCP - General (Family Medicine) Erby Pian, MD as Referring Physician (Specialist) Teodoro Spray, MD as Consulting Physician (Cardiology) Noreene Filbert, MD as Referring Physician (Radiation Oncology) Hollice Espy, MD as Consulting Physician (Urology)   Assessment:   This is a routine wellness examination for Carl Allen.  Exercise Activities and Dietary recommendations Current Exercise Habits: The patient does not participate in regular exercise at present, Exercise limited by: None identified  Goals    . Quit smoking / using tobacco     Smoking cessation discussed       Fall Risk Fall Risk  01/26/2018 01/26/2018 09/29/2017 08/26/2017 01/12/2017  Falls in the past year? No No No No No   Is the patient's home free of loose throw rugs in walkways, pet beds, electrical cords, etc?   yes      Grab bars in the bathroom? no      Handrails on the stairs?   yes      Adequate lighting?   yes  Timed Get Up and Go Performed: Completed in 8 seconds with no use of assistive devices, steady gait. No intervention needed at this time.   Depression Screen PHQ 2/9 Scores 01/26/2018 01/26/2018 12/13/2017 10/07/2017   PHQ - 2 Score 0 0 0 0    Cognitive Function     6CIT Screen 01/26/2018 01/06/2017  What Year? 0 points 0 points  What month? 0 points 0 points  What time? 0 points 0 points  Count back from 20 0 points 0 points  Months in reverse 0 points 0 points  Repeat phrase 0 points 0 points  Total Score 0 0    Immunization History  Administered Date(s) Administered  . Influenza, High Dose Seasonal PF 04/07/2017  . Influenza-Unspecified 02/09/2015, 02/24/2016  . Pneumococcal Conjugate-13 12/21/2013  . Pneumococcal Polysaccharide-23 03/22/2008, 02/13/2013  . Td 09/20/2001  . Tdap 01/08/2016  . Zoster 12/18/2010    Qualifies for Shingles Vaccine? Yes, discussed shingrix vaccine   Screening Tests Health Maintenance  Topic Date Due  . INFLUENZA VACCINE  12/30/2017  . COLONOSCOPY  07/02/2020  . TETANUS/TDAP  01/07/2026  . Hepatitis C Screening  Completed  . PNA vac Low Risk Adult  Completed   Cancer Screenings: Lung: Low Dose CT Chest recommended if Age 58-80 years, 30 pack-year currently smoking OR have quit w/in 15years. Patient does qualify. Declined today Colorectal: completed 07/02/2010  Additional Screenings:  Hepatitis C Screening: completed 01/08/2016      Plan:    I have personally reviewed and addressed the Medicare Annual Wellness questionnaire and have noted the following in the patient's chart:  A. Medical and social history B. Use of alcohol, tobacco or illicit drugs  C. Current medications and supplements D. Functional ability and status E.  Nutritional status F.  Physical activity G. Advance directives H. List of other physicians I.  Hospitalizations, surgeries, and ER visits in previous 12 months J.  [place] such as hearing and vision if needed, cognitive and depression L. Referrals and appointments   In addition, I have reviewed and discussed with patient certain preventive protocols, quality metrics, and best practice recommendations. A written  personalized care plan for preventive services as well as general preventive health recommendations  were provided to patient.   Signed,  Tyler Aas, LPN Nurse Health Advisor   Nurse Notes:none

## 2018-01-26 NOTE — Patient Instructions (Addendum)
Mr. Depass , Thank you for taking time to come for your Medicare Wellness Visit. I appreciate your ongoing commitment to your health goals. Please review the following plan we discussed and let me know if I can assist you in the future.   Screening recommendations/referrals: Colonoscopy: completed 07/02/2010 Recommended yearly ophthalmology/optometry visit for glaucoma screening and checkup Recommended yearly dental visit for hygiene and checkup  Vaccinations: Influenza vaccine: due 03/2018 Pneumococcal vaccine: up to date Tdap vaccine: up to date Shingles vaccine: shingrix eligible, check with your insurance company for coverage   Advanced directives: Advance directive discussed with you today. I have provided a copy for you to complete at home and have notarized. Once this is complete please bring a copy in to our office so we can scan it into your chart.  Conditions/risks identified: smoking cessation discussed  Next appointment: Follow up on 02/02/2018 at 8:00am with Dr.Crissman. Follow up in one year for your annual wellness exam.   Preventive Care 65 Years and Older, Male Preventive care refers to lifestyle choices and visits with your health care provider that can promote health and wellness. What does preventive care include?  A yearly physical exam. This is also called an annual well check.  Dental exams once or twice a year.  Routine eye exams. Ask your health care provider how often you should have your eyes checked.  Personal lifestyle choices, including:  Daily care of your teeth and gums.  Regular physical activity.  Eating a healthy diet.  Avoiding tobacco and drug use.  Limiting alcohol use.  Practicing safe sex.  Taking low doses of aspirin every day.  Taking vitamin and mineral supplements as recommended by your health care provider. What happens during an annual well check? The services and screenings done by your health care provider during your annual  well check will depend on your age, overall health, lifestyle risk factors, and family history of disease. Counseling  Your health care provider may ask you questions about your:  Alcohol use.  Tobacco use.  Drug use.  Emotional well-being.  Home and relationship well-being.  Sexual activity.  Eating habits.  History of falls.  Memory and ability to understand (cognition).  Work and work Statistician. Screening  You may have the following tests or measurements:  Height, weight, and BMI.  Blood pressure.  Lipid and cholesterol levels. These may be checked every 5 years, or more frequently if you are over 59 years old.  Skin check.  Lung cancer screening. You may have this screening every year starting at age 41 if you have a 30-pack-year history of smoking and currently smoke or have quit within the past 15 years.  Fecal occult blood test (FOBT) of the stool. You may have this test every year starting at age 73.  Flexible sigmoidoscopy or colonoscopy. You may have a sigmoidoscopy every 5 years or a colonoscopy every 10 years starting at age 50.  Prostate cancer screening. Recommendations will vary depending on your family history and other risks.  Hepatitis C blood test.  Hepatitis B blood test.  Sexually transmitted disease (STD) testing.  Diabetes screening. This is done by checking your blood sugar (glucose) after you have not eaten for a while (fasting). You may have this done every 1-3 years.  Abdominal aortic aneurysm (AAA) screening. You may need this if you are a current or former smoker.  Osteoporosis. You may be screened starting at age 68 if you are at high risk. Talk with your health  care provider about your test results, treatment options, and if necessary, the need for more tests. Vaccines  Your health care provider may recommend certain vaccines, such as:  Influenza vaccine. This is recommended every year.  Tetanus, diphtheria, and acellular  pertussis (Tdap, Td) vaccine. You may need a Td booster every 10 years.  Zoster vaccine. You may need this after age 75.  Pneumococcal 13-valent conjugate (PCV13) vaccine. One dose is recommended after age 17.  Pneumococcal polysaccharide (PPSV23) vaccine. One dose is recommended after age 51. Talk to your health care provider about which screenings and vaccines you need and how often you need them. This information is not intended to replace advice given to you by your health care provider. Make sure you discuss any questions you have with your health care provider. Document Released: 06/14/2015 Document Revised: 02/05/2016 Document Reviewed: 03/19/2015 Elsevier Interactive Patient Education  2017 [place] Prevention in the Home Falls can cause injuries. They can happen to people of all ages. There are many things you can do to make your home safe and to help prevent falls. What can I do on the outside of my home?  Regularly fix the edges of walkways and driveways and fix any cracks.  Remove anything that might make you trip as you walk through a door, such as a raised step or threshold.  Trim any bushes or trees on the path to your home.  Use bright outdoor lighting.  Clear any walking paths of anything that might make someone trip, such as rocks or tools.  Regularly check to see if handrails are loose or broken. Make sure that both sides of any steps have handrails.  Any raised decks and porches should have guardrails on the edges.  Have any leaves, snow, or ice cleared regularly.  Use sand or salt on walking paths during winter.  Clean up any spills in your garage right away. This includes oil or grease spills. What can I do in the bathroom?  Use night lights.  Install grab bars by the toilet and in the tub and shower. Do not use towel bars as grab bars.  Use non-skid mats or decals in the tub or shower.  If you need to sit down in the shower, use a plastic,  non-slip stool.  Keep the floor dry. Clean up any water that spills on the floor as soon as it happens.  Remove soap buildup in the tub or shower regularly.  Attach bath mats securely with double-sided non-slip rug tape.  Do not have throw rugs and other things on the floor that can make you trip. What can I do in the bedroom?  Use night lights.  Make sure that you have a light by your bed that is easy to reach.  Do not use any sheets or blankets that are too big for your bed. They should not hang down onto the floor.  Have a firm chair that has side arms. You can use this for support while you get dressed.  Do not have throw rugs and other things on the floor that can make you trip. What can I do in the kitchen?  Clean up any spills right away.  Avoid walking on wet floors.  Keep items that you use a lot in easy-to-reach places.  If you need to reach something above you, use a strong step stool that has a grab bar.  Keep electrical cords out of the way.  Do not use floor  polish or wax that makes floors slippery. If you must use wax, use non-skid floor wax.  Do not have throw rugs and other things on the floor that can make you trip. What can I do with my stairs?  Do not leave any items on the stairs.  Make sure that there are handrails on both sides of the stairs and use them. Fix handrails that are broken or loose. Make sure that handrails are as long as the stairways.  Check any carpeting to make sure that it is firmly attached to the stairs. Fix any carpet that is loose or worn.  Avoid having throw rugs at the top or bottom of the stairs. If you do have throw rugs, attach them to the floor with carpet tape.  Make sure that you have a light switch at the top of the stairs and the bottom of the stairs. If you do not have them, ask someone to add them for you. What else can I do to help prevent falls?  Wear shoes that:  Do not have high heels.  Have rubber  bottoms.  Are comfortable and fit you well.  Are closed at the toe. Do not wear sandals.  If you use a stepladder:  Make sure that it is fully opened. Do not climb a closed stepladder.  Make sure that both sides of the stepladder are locked into place.  Ask someone to hold it for you, if possible.  Clearly mark and make sure that you can see:  Any grab bars or handrails.  First and last steps.  Where the edge of each step is.  Use tools that help you move around (mobility aids) if they are needed. These include:  Canes.  Walkers.  Scooters.  Crutches.  Turn on the lights when you go into a dark area. Replace any light bulbs as soon as they burn out.  Set up your furniture so you have a clear path. Avoid moving your furniture around.  If any of your floors are uneven, fix them.  If there are any pets around you, be aware of where they are.  Review your medicines with your doctor. Some medicines can make you feel dizzy. This can increase your chance of falling. Ask your doctor what other things that you can do to help prevent falls. This information is not intended to replace advice given to you by your health care provider. Make sure you discuss any questions you have with your health care provider. Document Released: 03/14/2009 Document Revised: 10/24/2015 Document Reviewed: 06/22/2014 Elsevier Interactive Patient Education  2017 [employer].   Steps to Quit Smoking Smoking tobacco can be bad for your health. It can also affect almost every organ in your body. Smoking puts you and people around you at risk for many serious long-lasting (chronic) diseases. Quitting smoking is hard, but it is one of the best things that you can do for your health. It is never too late to quit. What are the benefits of quitting smoking? When you quit smoking, you lower your risk for getting serious diseases and conditions. They can include:  Lung cancer or lung disease.  Heart  disease.  Stroke.  Heart attack.  Not being able to have children (infertility).  Weak bones (osteoporosis) and broken bones (fractures).  If you have coughing, wheezing, and shortness of breath, those symptoms may get better when you quit. You may also get sick less often. If you are pregnant, quitting smoking can help to  lower your chances of having a baby of low birth weight. What can I do to help me quit smoking? Talk with your doctor about what can help you quit smoking. Some things you can do (strategies) include:  Quitting smoking totally, instead of slowly cutting back how much you smoke over a period of time.  Going to in-person counseling. You are more likely to quit if you go to many counseling sessions.  Using resources and support systems, such as: ? Database administrator with a Social worker. ? Phone quitlines. ? Careers information officer. ? Support groups or group counseling. ? Text messaging programs. ? Mobile phone apps or applications.  Taking medicines. Some of these medicines may have nicotine in them. If you are pregnant or breastfeeding, do not take any medicines to quit smoking unless your doctor says it is okay. Talk with your doctor about counseling or other things that can help you.  Talk with your doctor about using more than one strategy at the same time, such as taking medicines while you are also going to in-person counseling. This can help make quitting easier. What things can I do to make it easier to quit? Quitting smoking might feel very hard at first, but there is a lot that you can do to make it easier. Take these steps:  Talk to your family and friends. Ask them to support and encourage you.  Call phone quitlines, reach out to support groups, or work with a Social worker.  Ask people who smoke to not smoke around you.  Avoid places that make you want (trigger) to smoke, such as: ? Bars. ? Parties. ? Smoke-break areas at work.  Spend time with people  who do not smoke.  Lower the stress in your life. Stress can make you want to smoke. Try these things to help your stress: ? Getting regular exercise. ? Deep-breathing exercises. ? Yoga. ? Meditating. ? Doing a body scan. To do this, close your eyes, focus on one area of your body at a time from head to toe, and notice which parts of your body are tense. Try to relax the muscles in those areas.  Download or buy apps on your mobile phone or tablet that can help you stick to your quit plan. There are many free apps, such as QuitGuide from the [employer] Office manager for Disease Control and Prevention). You can find more support from smokefree.gov and other websites.  This information is not intended to replace advice given to you by your health care provider. Make sure you discuss any questions you have with your health care provider. Document Released: 03/14/2009 Document Revised: 01/14/2016 Document Reviewed: 10/02/2014 Elsevier Interactive Patient Education  2018 [employer].

## 2018-01-27 LAB — CBC WITH DIFFERENTIAL/PLATELET
BASOS: 1 %
Basophils Absolute: 0 10*3/uL (ref 0.0–0.2)
EOS (ABSOLUTE): 0.1 10*3/uL (ref 0.0–0.4)
EOS: 2 %
HEMATOCRIT: 42.6 % (ref 37.5–51.0)
HEMOGLOBIN: 14.3 g/dL (ref 13.0–17.7)
IMMATURE GRANS (ABS): 0 10*3/uL (ref 0.0–0.1)
Immature Granulocytes: 0 %
LYMPHS: 22 %
Lymphocytes Absolute: 1.5 10*3/uL (ref 0.7–3.1)
MCH: 32.5 pg (ref 26.6–33.0)
MCHC: 33.6 g/dL (ref 31.5–35.7)
MCV: 97 fL (ref 79–97)
MONOCYTES: 5 %
Monocytes Absolute: 0.3 10*3/uL (ref 0.1–0.9)
NEUTROS PCT: 70 %
Neutrophils Absolute: 4.9 10*3/uL (ref 1.4–7.0)
Platelets: 214 10*3/uL (ref 150–450)
RBC: 4.4 x10E6/uL (ref 4.14–5.80)
RDW: 13.4 % (ref 12.3–15.4)
WBC: 6.9 10*3/uL (ref 3.4–10.8)

## 2018-01-27 LAB — COMPREHENSIVE METABOLIC PANEL
A/G RATIO: 1.7 (ref 1.2–2.2)
ALT: 9 IU/L (ref 0–44)
AST: 13 IU/L (ref 0–40)
Albumin: 4.7 g/dL (ref 3.5–4.8)
Alkaline Phosphatase: 61 IU/L (ref 39–117)
BUN/Creatinine Ratio: 13 (ref 10–24)
BUN: 11 mg/dL (ref 8–27)
Bilirubin Total: 0.6 mg/dL (ref 0.0–1.2)
CO2: 22 mmol/L (ref 20–29)
CREATININE: 0.87 mg/dL (ref 0.76–1.27)
Calcium: 10 mg/dL (ref 8.6–10.2)
Chloride: 106 mmol/L (ref 96–106)
GFR calc non Af Amer: 86 mL/min/{1.73_m2} (ref >59)
GFR, EST AFRICAN AMERICAN: 100 mL/min/{1.73_m2} (ref >59)
GLOBULIN, TOTAL: 2.7 g/dL (ref 1.5–4.5)
Glucose: 100 mg/dL — ABNORMAL HIGH (ref 65–99)
POTASSIUM: 4.1 mmol/L (ref 3.5–5.2)
SODIUM: 144 mmol/L (ref 134–144)
TOTAL PROTEIN: 7.4 g/dL (ref 6.0–8.5)

## 2018-01-27 LAB — PSA: PROSTATE SPECIFIC AG, SERUM: 12.6 ng/mL — AB (ref 0.0–4.0)

## 2018-01-27 LAB — LIPID PANEL W/O CHOL/HDL RATIO
Cholesterol, Total: 180 mg/dL (ref 100–199)
HDL: 31 mg/dL — AB (ref >39)
LDL CALC: 73 mg/dL (ref 0–99)
Triglycerides: 381 mg/dL — ABNORMAL HIGH (ref 0–149)
VLDL Cholesterol Cal: 76 mg/dL — ABNORMAL HIGH (ref 5–40)

## 2018-01-27 LAB — TSH: TSH: 2.13 u[IU]/mL (ref 0.450–4.500)

## 2018-02-02 ENCOUNTER — Encounter: Payer: Self-pay | Admitting: Family Medicine

## 2018-02-02 ENCOUNTER — Ambulatory Visit (INDEPENDENT_AMBULATORY_CARE_PROVIDER_SITE_OTHER): Payer: PPO | Admitting: Family Medicine

## 2018-02-02 VITALS — BP 128/76 | HR 77 | Ht 67.0 in | Wt 161.0 lb

## 2018-02-02 DIAGNOSIS — Z7189 Other specified counseling: Secondary | ICD-10-CM

## 2018-02-02 DIAGNOSIS — Z23 Encounter for immunization: Secondary | ICD-10-CM | POA: Diagnosis not present

## 2018-02-02 DIAGNOSIS — J410 Simple chronic bronchitis: Secondary | ICD-10-CM

## 2018-02-02 DIAGNOSIS — C61 Malignant neoplasm of prostate: Secondary | ICD-10-CM

## 2018-02-02 DIAGNOSIS — Z7141 Alcohol abuse counseling and surveillance of alcoholic: Secondary | ICD-10-CM

## 2018-02-02 DIAGNOSIS — I10 Essential (primary) hypertension: Secondary | ICD-10-CM

## 2018-02-02 DIAGNOSIS — E7800 Pure hypercholesterolemia, unspecified: Secondary | ICD-10-CM

## 2018-02-02 DIAGNOSIS — Z0000 Encounter for general adult medical examination without abnormal findings: Secondary | ICD-10-CM | POA: Diagnosis not present

## 2018-02-02 MED ORDER — HYDROCHLOROTHIAZIDE 25 MG PO TABS: 25.0000 mg | ORAL_TABLET | Freq: Every day | ORAL | 4 refills | Status: DC

## 2018-02-02 MED ORDER — TIOTROPIUM BROMIDE MONOHYDRATE 18 MCG IN CAPS: 18.0000 ug | ORAL_CAPSULE | Freq: Every morning | RESPIRATORY_TRACT | 4 refills | Status: DC

## 2018-02-02 MED ORDER — FENOFIBRATE MICRONIZED 134 MG PO CAPS: 134.0000 mg | ORAL_CAPSULE | Freq: Every day | ORAL | 4 refills | Status: DC

## 2018-02-02 MED ORDER — AMLODIPINE BESYLATE 5 MG PO TABS: 5.0000 mg | ORAL_TABLET | Freq: Every day | ORAL | 4 refills | Status: DC

## 2018-02-02 MED ORDER — BENAZEPRIL HCL 40 MG PO TABS: 40.0000 mg | ORAL_TABLET | Freq: Every day | ORAL | 4 refills | Status: DC

## 2018-02-02 NOTE — Assessment & Plan Note (Signed)
A voluntary discussion about advanced care planning including explanation and discussion of advanced directives was extentively discussed with the patient.  Explained about the healthcare proxy and living will was reviewed and packet with forms with expiration of how to fill them out was given.  Time spent: Encounter 16+ min individuals present: Patient 

## 2018-02-02 NOTE — Assessment & Plan Note (Signed)
Has cut drastically back on alcohol

## 2018-02-02 NOTE — Assessment & Plan Note (Signed)
Just had seed implant doing well

## 2018-02-02 NOTE — Assessment & Plan Note (Signed)
The current medical regimen is effective;  continue present plan and medications.  

## 2018-02-02 NOTE — Patient Instructions (Signed)

## 2018-02-02 NOTE — Progress Notes (Signed)
BP 128/76   Pulse 77   Ht _0  (1.702 m)   Wt 161 lb (73 kg)   SpO2 99%   BMI 25.22 kg/m    Subjective:    Patient ID: Carl Allen, male    DOB: 1946/03/08, 72 y.o.   MRN: [id number]  HPI: Carl Allen is a 72 y.o. male  Chief Complaint  Patient presents with  . Annual Exam  Patient all in all doing well just had seed implant for prostate cancer done couple weeks ago is recovering okay from that still having some dysuria and frequency otherwise doing well. Blood pressure doing well with no complaints.  Taking fenofibrate without problems.   Relevant past medical, surgical, family and social history reviewed and updated as indicated. Interim medical history since our last visit reviewed. Allergies and medications reviewed and updated.  Review of Systems  Constitutional: Negative.   HENT: Negative.   Eyes: Negative.   Respiratory: Negative.   Cardiovascular: Negative.   Gastrointestinal: Negative.   Endocrine: Negative.   Genitourinary: Negative.   Musculoskeletal: Negative.   Skin: Negative.   Allergic/Immunologic: Negative.   Neurological: Negative.   Hematological: Negative.   Psychiatric/Behavioral: Negative.     Per HPI unless specifically indicated above     Objective:    BP 128/76   Pulse 77   Ht _1  (1.702 m)   Wt 161 lb (73 kg)   SpO2 99%   BMI 25.22 kg/m   Wt Readings from Last 3 Encounters:  02/02/18 161 lb (73 kg)  01/26/18 160 lb 1.6 oz (72.6 kg)  01/10/18 161 lb 1.6 oz (73.1 kg)    Physical Exam  Constitutional: He is oriented to person, place, and time. He appears well-developed and well-nourished.  HENT:  Head: Normocephalic.  Right Ear: External ear normal.  Left Ear: External ear normal.  Nose: Nose normal.  Eyes: Pupils are equal, round, and reactive to light. Conjunctivae and EOM are normal.  Neck: Normal range of motion. Neck supple. No thyromegaly present.  Cardiovascular: Normal rate, regular rhythm, normal heart  sounds and intact distal pulses.  Pulmonary/Chest: Effort normal and breath sounds normal.  Abdominal: Soft. Bowel sounds are normal. There is no splenomegaly or hepatomegaly.  Genitourinary:  Genitourinary Comments: Done at urology undergoing treatment for prostate cancer with radiation seed implant.  Musculoskeletal: Normal range of motion.  Lymphadenopathy:    He has no cervical adenopathy.  Neurological: He is alert and oriented to person, place, and time. He has normal reflexes.  Skin: Skin is warm and dry.  Psychiatric: He has a normal mood and affect. His behavior is normal. Judgment and thought content normal.    Results for orders placed or performed in visit on 01/26/18  Microscopic Examination  Result Value Ref Range   WBC, UA 0-5 0 - 5 /hpf   RBC, UA 0-2 0 - 2 /hpf   Epithelial Cells (non renal) 0-10 0 - 10 /hpf   Crystals Present N/A   Crystal Type Calcium Oxalate N/A   Bacteria, UA None seen None seen/Few  CBC with Differential  Result Value Ref Range   WBC 6.9 3.4 - 10.8 x10E3/uL   RBC 4.40 4.14 - 5.80 x10E6/uL   Hemoglobin 14.3 13.0 - 17.7 g/dL   Hematocrit 42.6 37.5 - 51.0 %   MCV 97 79 - 97 fL   MCH 32.5 26.6 - 33.0 pg   MCHC 33.6 31.5 - 35.7 g/dL   RDW 13.4 12.3 -  15.4 %   Platelets 214 150 - 450 x10E3/uL   Neutrophils 70 Not Estab. %   Lymphs 22 Not Estab. %   Monocytes 5 Not Estab. %   Eos 2 Not Estab. %   Basos 1 Not Estab. %   Neutrophils Absolute 4.9 1.4 - 7.0 x10E3/uL   Lymphocytes Absolute 1.5 0.7 - 3.1 x10E3/uL   Monocytes Absolute 0.3 0.1 - 0.9 x10E3/uL   EOS (ABSOLUTE) 0.1 0.0 - 0.4 x10E3/uL   Basophils Absolute 0.0 0.0 - 0.2 x10E3/uL   Immature Granulocytes 0 Not Estab. %   Immature Grans (Abs) 0.0 0.0 - 0.1 x10E3/uL  Comp Met (CMET)  Result Value Ref Range   Glucose 100 (H) 65 - 99 mg/dL   BUN 11 8 - 27 mg/dL   Creatinine, Ser 0.87 0.76 - 1.27 mg/dL   GFR calc non Af Amer 86 >59 mL/min/1.73   GFR calc Af Amer 100 >59 mL/min/1.73    BUN/Creatinine Ratio 13 10 - 24   Sodium 144 134 - 144 mmol/L   Potassium 4.1 3.5 - 5.2 mmol/L   Chloride 106 96 - 106 mmol/L   CO2 22 20 - 29 mmol/L   Calcium 10.0 8.6 - 10.2 mg/dL   Total Protein 7.4 6.0 - 8.5 g/dL   Albumin 4.7 3.5 - 4.8 g/dL   Globulin, Total 2.7 1.5 - 4.5 g/dL   Albumin/Globulin Ratio 1.7 1.2 - 2.2   Bilirubin Total 0.6 0.0 - 1.2 mg/dL   Alkaline Phosphatase 61 39 - 117 IU/L   AST 13 0 - 40 IU/L   ALT 9 0 - 44 IU/L  Lipid Panel w/o Chol/HDL Ratio  Result Value Ref Range   Cholesterol, Total 180 100 - 199 mg/dL   Triglycerides 381 (H) 0 - 149 mg/dL   HDL 31 (L) >39 mg/dL   VLDL Cholesterol Cal 76 (H) 5 - 40 mg/dL   LDL Calculated 73 0 - 99 mg/dL  Urinalysis, Routine w reflex microscopic  Result Value Ref Range   Specific Gravity, UA 1.015 1.005 - 1.030   pH, UA 7.0 5.0 - 7.5   Color, UA Yellow Yellow   Appearance Ur Clear Clear   Leukocytes, UA Negative Negative   Protein, UA Negative Negative/Trace   Glucose, UA Negative Negative   Ketones, UA Negative Negative   RBC, UA Trace (A) Negative   Bilirubin, UA Negative Negative   Urobilinogen, Ur 0.2 0.2 - 1.0 mg/dL   Nitrite, UA Negative Negative   Microscopic Examination See below:   TSH  Result Value Ref Range   TSH 2.130 0.450 - 4.500 uIU/mL  PSA  Result Value Ref Range   Prostate Specific Ag, Serum 12.6 (H) 0.0 - 4.0 ng/mL      Assessment & Plan:   Problem List Items Addressed This Visit      Cardiovascular and Mediastinum   Essential hypertension    The current medical regimen is effective;  continue present plan and medications.         Respiratory   COPD (chronic obstructive pulmonary disease) (HCC)    The current medical regimen is effective;  continue present plan and medications. Still smoking encouraged to stop         Genitourinary   Prostate cancer ([place])    Just had seed implant doing well        Other   Alcohol abuse counseling and surveillance    Has cut  drastically back on alcohol  Hypercholesteremia    The current medical regimen is effective;  continue present plan and medications.       Advanced care planning/counseling discussion    A voluntary discussion about advanced care planning including explanation and discussion of advanced directives was extentively discussed with the patient.  Explained about the healthcare proxy and living will was reviewed and packet with forms with expiration of how to fill them out was given.  Time spent: Encounter 16+ min individuals present: Patient       Other Visit Diagnoses    Needs flu shot    -  Primary   Relevant Orders   Flu vaccine HIGH DOSE PF (Fluzone High dose) (Completed)       Follow up plan: Return in about 6 months (around 08/03/2018) for BMP,  Lipids, ALT, AST.

## 2018-02-02 NOTE — Assessment & Plan Note (Addendum)
The current medical regimen is effective;  continue present plan and medications. Still smoking encouraged to stop

## 2018-02-14 ENCOUNTER — Ambulatory Visit
Admission: RE | Admit: 2018-02-14 | Discharge: 2018-02-14 | Disposition: A | Discharge status: Home / Self Care | Payer: PPO | Source: Ambulatory Visit | Attending: Radiation Oncology | Admitting: Radiation Oncology

## 2018-02-14 ENCOUNTER — Other Ambulatory Visit: Payer: Self-pay

## 2018-02-14 ENCOUNTER — Encounter: Payer: Self-pay | Admitting: Radiation Oncology

## 2018-02-14 VITALS — BP 147/88 | HR 82 | Temp 96.9°F | Resp 16 | Wt 161.5 lb

## 2018-02-14 DIAGNOSIS — C61 Malignant neoplasm of prostate: Secondary | ICD-10-CM

## 2018-02-14 DIAGNOSIS — Z923 Personal history of irradiation: Secondary | ICD-10-CM | POA: Diagnosis not present

## 2018-02-14 DIAGNOSIS — R351 Nocturia: Secondary | ICD-10-CM | POA: Insufficient documentation

## 2018-02-14 DIAGNOSIS — R350 Frequency of micturition: Secondary | ICD-10-CM | POA: Diagnosis not present

## 2018-02-14 NOTE — Progress Notes (Signed)
Radiation Oncology Follow up Note  Name: Carl Allen   Date:   02/14/2018 MRN:  [id number] DOB: [birth year]    This 72 y.o. male presents to the clinic today for one-month follow-up status post I-125 interstitial implant for Gleason 6 (3+3) adenocarcinoma the prostate.  REFERRING PROVIDER: [name], MD  HPI: patient is a 72 year old male now out 1 month having completed I-125 interstitial implant for Gleason 6 (3+3) adenocarcinoma the prostate presenting the PSA of 5. He is seen today for routine follow-up is doing fairly well still has some urgency and frequency of urination as well as nocturia 3-4. He specifically denies diarrhea. Patient was on Flomax although that is been discontinued by the patient and has not caused any significant problems..  COMPLICATIONS OF TREATMENT: none  FOLLOW UP COMPLIANCE: keeps appointments   PHYSICAL EXAM:  There were no vitals taken for this visit. Well-developed well-nourished patient in NAD. HEENT reveals PERLA, EOMI, discs not visualized.  Oral cavity is clear. No oral mucosal lesions are identified. Neck is clear without evidence of cervical or supraclavicular adenopathy. Lungs are clear to A&P. Cardiac examination is essentially unremarkable with regular rate and rhythm without murmur rub or thrill. Abdomen is benign with no organomegaly or masses noted. Motor sensory and DTR levels are equal and symmetric in the upper and lower extremities. Cranial nerves II through XII are grossly intact. Proprioception is intact. No peripheral adenopathy or edema is identified. No motor or sensory levels are noted. Crude visual fields are within normal range.  RADIOLOGY RESULTS: CT scan for quality assurance on source placements was performed showing excellent source placementon initial inspection.  PLAN: the present time patient has typical symptoms out 1 month from I-125 interstitial implant. I've assured him he is still under treatment as the seeds are  still radioactive. I've assured him his symptoms will resolve after the next couple of months. I've asked to see him back in 3-4 months with a PSA prior to the next follow-up visit. Patient comprehends my treatment plan well.full CT evaluation of his source placements will be performed and reviewed.  I would like to take this opportunity to thank you for allowing me to participate in the care of your patient.Noreene Filbert, MD

## 2018-02-14 NOTE — Progress Notes (Signed)
Radiation Oncology Follow up Note  Name: Carl Allen         Date:   02/14/2018 MRN:  [id number] DOB: [birth year]               This 72 y.o. male presents to the clinic today for one-month follow-up status post I-125 interstitial implant for Gleason 6 (3+3) adenocarcinoma the prostate.  REFERRING PROVIDER: [name], MD  HPI: patient is a 72 year old male now out 1 month having completed I-125 interstitial implant for Gleason 6 (3+3) adenocarcinoma the prostate presenting the PSA of 5. He is seen today for routine follow-up is doing fairly well still has some urgency and frequency of urination as well as nocturia 3-4. He specifically denies diarrhea. Patient was on Flomax although that is been discontinued by the patient and has not caused any significant problems..  COMPLICATIONS OF TREATMENT: none  FOLLOW UP COMPLIANCE: keeps appointments   PHYSICAL EXAM:  There were no vitals taken for this visit. Well-developed well-nourished patient in NAD. HEENT reveals PERLA, EOMI, discs not visualized.  Oral cavity is clear. No oral mucosal lesions are identified. Neck is clear without evidence of cervical or supraclavicular adenopathy. Lungs are clear to A&P. Cardiac examination is essentially unremarkable with regular rate and rhythm without murmur rub or thrill. Abdomen is benign with no organomegaly or masses noted. Motor sensory and DTR levels are equal and symmetric in the upper and lower extremities. Cranial nerves II through XII are grossly intact. Proprioception is intact. No peripheral adenopathy or edema is identified. No motor or sensory levels are noted. Crude visual fields are within normal range.  RADIOLOGY RESULTS: CT scan for quality assurance on source placements was performed showing excellent source placementon initial inspection.  PLAN: the present time patient has typical symptoms out 1 month from I-125 interstitial implant. I've assured him he is still under  treatment as the seeds are still radioactive. I've assured him his symptoms will resolve after the next couple of months. I've asked to see him back in 3-4 months with a PSA prior to the next follow-up visit. Patient comprehends my treatment plan well.full CT evaluation of his source placements will be performed and reviewed.  I would like to take this opportunity to thank you for allowing me to participate in the care of your patient.Noreene Filbert, MD          Electronically signed by Noreene Filbert, MD at 02/14/2018 12:01 PM     CT SIMULATION on 02/14/2018

## 2018-02-22 ENCOUNTER — Ambulatory Visit
Admission: RE | Admit: 2018-02-22 | Discharge: 2018-02-22 | Disposition: A | Discharge status: Home / Self Care | Payer: PPO | Source: Ambulatory Visit | Attending: Radiation Oncology | Admitting: Radiation Oncology

## 2018-02-22 DIAGNOSIS — C61 Malignant neoplasm of prostate: Secondary | ICD-10-CM | POA: Diagnosis not present

## 2018-02-23 ENCOUNTER — Ambulatory Visit (INDEPENDENT_AMBULATORY_CARE_PROVIDER_SITE_OTHER): Payer: PPO | Admitting: Urology

## 2018-02-23 ENCOUNTER — Encounter: Payer: Self-pay | Admitting: Urology

## 2018-02-23 VITALS — BP 145/82 | HR 84 | Ht 67.0 in | Wt 160.0 lb

## 2018-02-23 DIAGNOSIS — R350 Frequency of micturition: Secondary | ICD-10-CM

## 2018-02-23 DIAGNOSIS — C61 Malignant neoplasm of prostate: Secondary | ICD-10-CM | POA: Diagnosis not present

## 2018-02-23 DIAGNOSIS — R339 Retention of urine, unspecified: Secondary | ICD-10-CM

## 2018-02-23 LAB — BLADDER SCAN AMB NON-IMAGING

## 2018-02-23 MED ORDER — TAMSULOSIN HCL 0.4 MG PO CAPS: 0.4000 mg | ORAL_CAPSULE | Freq: Every day | ORAL | 11 refills | Status: DC

## 2018-02-23 NOTE — Progress Notes (Signed)
02/23/2018 2:25 PM   Carl Allen [birth year] [id number]  Referring provider: [name], MD [address] [place], [place] [address]  Chief Complaint  Patient presents with  . Post-op Follow-up    HPI: 72 year old male with intermediate risk prostate cancer status post brachytherapy implantation on 12/2017 he returns today for routine follow-up visit.  He was initially diagnosed with low risk prostate cancer in 2018.  He underwent repeat biopsy which showed a more aggressive, Gleason 3+4 disease and 2 of the course thus ultimately elected to pursue treatment in the form of prostate seed implantation.  Patient tolerated the procedure well.  Today, he has a urinary urinary urgency and frequency.  He also feels like he is not emptying his bladder completely.  He reports that he did have some of this at baseline but is been worse since surgery.  He was taking Flomax for 30 days postoperatively but ran out of the medication.  He did feel like he was doing slightly better on the Flomax.  IPSS as below.  He denies any dysuria, gross hematuria, or pain with urination.  He does not feel like he has a urinary tract infection.  His urine was checked by his primary care physician without signs of infection.   IPSS    Row Name 02/23/18 0900         International Prostate Symptom Score   How often have you had the sensation of not emptying your bladder?  Almost always     How often have you had to urinate less than every two hours?  More than half the time     How often have you found you stopped and started again several times when you urinated?  Less than half the time     How often have you found it difficult to postpone urination?  Less than half the time     How often have you had a weak urinary stream?  Less than 1 in 5 times     How often have you had to strain to start urination?  Less than 1 in 5 times     How many times did you typically get up at night to urinate?  2 Times       Total IPSS Score  17       Quality of Life due to urinary symptoms   If you were to spend the rest of your life with your urinary condition just the way it is now how would you feel about that?  Unhappy        Score:  1-7 Mild 8-19 Moderate 20-35 Severe   PMH: Past Medical History:  Diagnosis Date  . Allergy   . Anxiety    not often  . Arthritis    fingers, knees  . Cancer ([place])   . Cataract   . Chronic alcoholism ([place])   . COPD (chronic obstructive pulmonary disease) ([place])   . Depression    pt states sometimes  . Dyspnea   . Emphysema of lung ([place])   . GERD (gastroesophageal reflux disease)    pt states every now and then  . Hypertension   . Hypertriglyceridemia     Surgical History: Past Surgical History:  Procedure Laterality Date  . CATARACT EXTRACTION, BILATERAL  2014  . COLONOSCOPY  2012  . CYSTOSCOPY N/A 01/10/2018   Procedure: CYSTOSCOPY;  Surgeon: Hollice Espy, MD;  Location: [facility] ORS;  Service: Urology;  Laterality: N/A;  . RADIOACTIVE  SEED IMPLANT N/A 01/10/2018   Procedure: RADIOACTIVE SEED IMPLANT/BRACHYTHERAPY IMPLANT;  Surgeon: Hollice Espy, MD;  Location: [facility] ORS;  Service: Urology;  Laterality: N/A;  . TONSILLECTOMY    . TONSILLECTOMY AND ADENOIDECTOMY  1952    Home Medications:  Allergies as of 02/23/2018   No Known Allergies     Medication List        Accurate as of 02/23/18  2:25 PM. Always use your most recent med list.          amLODipine 5 MG tablet Commonly known as:  NORVASC Take 1 tablet (5 mg total) by mouth daily.   benazepril 40 MG tablet Commonly known as:  LOTENSIN Take 1 tablet (40 mg total) by mouth daily.   CENTRUM SILVER 50+MEN Tabs Take 1 tablet by mouth daily.   cetirizine 10 MG tablet Commonly known as:  ZYRTEC Take 10 mg by mouth daily as needed for allergies.   docusate sodium 100 MG capsule Commonly known as:  COLACE Take 1 capsule (100 mg total) by mouth 2 (two) times daily as needed (take to  keep stool soft.).   fenofibrate micronized 134 MG capsule Commonly known as:  LOFIBRA Take 1 capsule (134 mg total) by mouth daily before breakfast.   ferrous sulfate 325 (65 FE) MG tablet Take 325 mg by mouth daily with breakfast.   hydrochlorothiazide 25 MG tablet Commonly known as:  HYDRODIURIL Take 1 tablet (25 mg total) by mouth daily.   tamsulosin 0.4 MG Caps capsule Commonly known as:  FLOMAX Take 1 capsule (0.4 mg total) by mouth daily.   tiotropium 18 MCG inhalation capsule Commonly known as:  SPIRIVA Place 1 capsule (18 mcg total) into inhaler and inhale every morning.       Allergies: No Known Allergies  Family History: Family History  Problem Relation Age of Onset  . Arthritis Mother   . Heart disease Mother   . Hearing loss Mother   . Hyperlipidemia Mother   . Hypertension Mother   . Kidney disease Mother   . Miscarriages / [place] Mother   . Vision loss Mother   . Stroke Father   . Heart disease Maternal Grandmother   . Stroke Maternal Grandfather   . Cancer Paternal Grandfather     Social History:  reports that he has been smoking. He has been smoking about 1.50 packs per day. He has never used smokeless tobacco. He reports that he drinks alcohol. He reports that he does not use drugs.  ROS: UROLOGY Frequent Urination?: Yes Hard to postpone urination?: No Burning/pain with urination?: No Get up at night to urinate?: Yes Leakage of urine?: No Urine stream starts and stops?: No Trouble starting stream?: No Do you have to strain to urinate?: No Blood in urine?: No Urinary tract infection?: No Sexually transmitted disease?: No Injury to kidneys or bladder?: No Painful intercourse?: No Weak stream?: No Erection problems?: No Penile pain?: No  Gastrointestinal Nausea?: No Vomiting?: No Indigestion/heartburn?: No Diarrhea?: No Constipation?: No  Constitutional Fever: No Night sweats?: No Weight loss?: No Fatigue?:  No  Skin Skin rash/lesions?: No Itching?: No  Eyes Blurred vision?: No Double vision?: No  Ears/Nose/Throat Sore throat?: No Sinus problems?: No  Hematologic/Lymphatic Swollen glands?: No Easy bruising?: No  Cardiovascular Leg swelling?: No Chest pain?: No  Respiratory Cough?: No Shortness of breath?: No  Endocrine Excessive thirst?: No  Musculoskeletal Back pain?: No Joint pain?: No  Neurological Headaches?: No Dizziness?: No  Psychologic Depression?: No Anxiety?: No  Physical Exam: BP (!) 145/82   Pulse 84   Ht 5\' 7"  (1.702 m)   Wt 160 lb (72.6 kg)   BMI 25.06 kg/m   Constitutional:  Alert and oriented, No acute distress. HEENT: [place] AT, moist mucus membranes.  Trachea midline, no masses. Cardiovascular: No clubbing, cyanosis, or edema. Respiratory: Normal respiratory effort, no increased work of breathing. Skin: No rashes, bruises or suspicious lesions. Neurologic: Grossly intact, no focal deficits, moving all 4 extremities. Psychiatric: Normal mood and affect.  Laboratory Data: Lab Results  Component Value Date   WBC 6.9 01/26/2018   HGB 14.3 01/26/2018   HCT 42.6 01/26/2018   MCV 97 01/26/2018   PLT 214 01/26/2018    Lab Results  Component Value Date   CREATININE 0.87 01/26/2018   Urinalysis UA from 01/26/2018 reviewed, negative   Pertinent Imaging: Results for orders placed or performed in visit on 02/23/18  BLADDER SCAN AMB NON-IMAGING  Result Value Ref Range   Scan Result 141ml     Assessment & Plan:    1. Prostate cancer [facility]) Intermediate risk prostate cancer now status post brachytherapy 12/2017 PSA was checked by primary care physician but just after seed implantation, not representative of response to treatment Patient will be due for PSA with Dr. Baruch Gouty in April  2. Urinary frequency Bladder scan somewhat elevated today, 152 cc Hesitant to place patient on anticholinergic/beta 3 agonist due to concern for going into  retention  urinary symptoms likely exacerbated by recent surgery Plan to resume Flomax for the time being Patient advised to call/ message if he does not see continued improvement or has worsening of his symptoms - BLADDER SCAN AMB NON-IMAGING  3. Incomplete bladder emptying Mildly elevated postvoid residual, will continue to monitor   Return in about 6 months (around 08/24/2018) for IPSS/ PVR/ PVR.  Hollice Espy, MD  [facility] [address], [place], [place] [address] [phone]

## 2018-06-23 ENCOUNTER — Inpatient Hospital Stay: Payer: PPO | Attending: Radiation Oncology | Admitting: *Deleted

## 2018-06-23 DIAGNOSIS — C61 Malignant neoplasm of prostate: Secondary | ICD-10-CM

## 2018-06-23 LAB — PSA: PROSTATIC SPECIFIC ANTIGEN: 0.98 ng/mL (ref 0.00–4.00)

## 2018-06-30 ENCOUNTER — Ambulatory Visit: Payer: PPO | Admitting: Radiation Oncology

## 2018-07-18 ENCOUNTER — Encounter: Payer: Self-pay | Admitting: Radiation Oncology

## 2018-07-18 ENCOUNTER — Other Ambulatory Visit: Payer: Self-pay

## 2018-07-18 ENCOUNTER — Ambulatory Visit
Admission: RE | Admit: 2018-07-18 | Discharge: 2018-07-18 | Disposition: A | Discharge status: Home / Self Care | Payer: PPO | Source: Ambulatory Visit | Attending: Radiation Oncology | Admitting: Radiation Oncology

## 2018-07-18 DIAGNOSIS — Z923 Personal history of irradiation: Secondary | ICD-10-CM | POA: Diagnosis not present

## 2018-07-18 DIAGNOSIS — C61 Malignant neoplasm of prostate: Secondary | ICD-10-CM | POA: Diagnosis not present

## 2018-07-18 NOTE — Progress Notes (Signed)
Radiation Oncology Follow up Note  Name: Carl Allen   Date:   07/18/2018 MRN:  [id number] DOB: 1945-07-26    This 73 y.o. male presents to the clinic today for 5 month follow-up status post I-125 interstitial implant for Gleason 6 adenocarcinoma the prostate.  REFERRING PROVIDER: [name], MD  HPI: patient is a 73 year old male now out 5 months having completed I-125 interstitial implant to his prostate for a Glea(3+3) adenocarcinoma. He presented with a PSA of 5. He is seen today in routine follow-up and is doingll. He specifically denies diarrhea dysuria or any other GI/GU complaints. His most recent PSA was 0.98.[name]  COMPLICATIONS OF TREATMENT: none  FOLLOW UP COMPLIANCE: keeps appointments   PHYSICAL EXAM:  There were no vitals taken for this visit. Well-developed well-nourished patient in NAD. HEENT reveals PERLA, EOMI, discs not visualized.  Oral cavity is clear. No oral mucosal lesions are identified. Neck is clear without evidence of cervical or supraclavicular adenopathy. Lungs are clear to A&P. Cardiac examination is essentially unremarkable with regular rate and rhythm without murmur rub or thrill. Abdomen is benign with no organomegaly or masses noted. Motor sensory and DTR levels are equal and symmetric in the upper and lower extremities. Cranial nerves II through XII are grossly intact. Proprioception is intact. No peripheral adenopathy or edema is identified. No motor or sensory levels are noted. Crude visual fields are within normal range.  RADIOLOGY RESULTS: no current films for review  PLAN: under the present time patient is under excellent biochemical control of his prostate cancer. I am please was overall progress. I've asked to see him back in 6 months for follow-up. Patient is to call sooner with any concerns at any time.  I would like to take this opportunity to thank you for allowing me to participate in the care of your patient.Noreene Filbert,  MD

## 2018-08-03 ENCOUNTER — Ambulatory Visit (INDEPENDENT_AMBULATORY_CARE_PROVIDER_SITE_OTHER): Payer: PPO | Admitting: Family Medicine

## 2018-08-03 ENCOUNTER — Encounter: Payer: Self-pay | Admitting: Family Medicine

## 2018-08-03 VITALS — BP 138/78 | HR 77 | Temp 98.4°F | Wt 159.8 lb

## 2018-08-03 DIAGNOSIS — Z122 Encounter for screening for malignant neoplasm of respiratory organs: Secondary | ICD-10-CM

## 2018-08-03 DIAGNOSIS — J410 Simple chronic bronchitis: Secondary | ICD-10-CM

## 2018-08-03 DIAGNOSIS — F1020 Alcohol dependence, uncomplicated: Secondary | ICD-10-CM

## 2018-08-03 DIAGNOSIS — E7800 Pure hypercholesterolemia, unspecified: Secondary | ICD-10-CM | POA: Diagnosis not present

## 2018-08-03 DIAGNOSIS — C61 Malignant neoplasm of prostate: Secondary | ICD-10-CM | POA: Diagnosis not present

## 2018-08-03 DIAGNOSIS — I10 Essential (primary) hypertension: Secondary | ICD-10-CM | POA: Diagnosis not present

## 2018-08-03 NOTE — Assessment & Plan Note (Signed)
The current medical regimen is effective;  continue present plan and medications.  

## 2018-08-03 NOTE — Assessment & Plan Note (Signed)
The current medical regimen is effective;  continue present plan and medications. a 

## 2018-08-03 NOTE — Assessment & Plan Note (Signed)
Discussed and limited drinking

## 2018-08-03 NOTE — Progress Notes (Signed)
BP 138/78 (BP Location: Left Arm)   Pulse 77   Temp 98.4 F (36.9 C) (Oral)   Wt 159 lb 12.8 oz (72.5 kg)   SpO2 98%   BMI 25.03 kg/m    Subjective:    Patient ID: Carl Allen, male    DOB: Oct 26, 1945, 73 y.o.   MRN: [id number]  HPI: Carl Allen is a 73 y.o. male  Chief Complaint  Patient presents with  . Hyperlipidemia  . Hypertension  Patient all in all doing well no complaints from cholesterol or blood pressure medications taken faithfully without problems. Prostate issues after radioactive seed implant is doing much better has been able to pretty much sleep all night continue takes tamsulosin for urinary frequency which is largely resolving.  Relevant past medical, surgical, family and social history reviewed and updated as indicated. Interim medical history since our last visit reviewed. Allergies and medications reviewed and updated.  Review of Systems  Constitutional: Negative.   Respiratory: Negative.   Cardiovascular: Negative.     Per HPI unless specifically indicated above     Objective:    BP 138/78 (BP Location: Left Arm)   Pulse 77   Temp 98.4 F (36.9 C) (Oral)   Wt 159 lb 12.8 oz (72.5 kg)   SpO2 98%   BMI 25.03 kg/m   Wt Readings from Last 3 Encounters:  08/03/18 159 lb 12.8 oz (72.5 kg)  02/23/18 160 lb (72.6 kg)  02/14/18 161 lb 7.8 oz (73.2 kg)    Physical Exam Constitutional:      Appearance: He is well-developed.  HENT:     Head: Normocephalic and atraumatic.  Eyes:     Conjunctiva/sclera: Conjunctivae normal.  Neck:     Musculoskeletal: Normal range of motion.  Cardiovascular:     Rate and Rhythm: Normal rate and regular rhythm.     Heart sounds: Normal heart sounds.  Pulmonary:     Effort: Pulmonary effort is normal.     Breath sounds: Normal breath sounds.  Musculoskeletal: Normal range of motion.  Skin:    Findings: No erythema.  Neurological:     Mental Status: He is alert and oriented to person, place, and time.   Psychiatric:        Behavior: Behavior normal.        Thought Content: Thought content normal.        Judgment: Judgment normal.     Results for orders placed or performed in visit on 06/23/18  PSA  Result Value Ref Range   Prostatic Specific Antigen 0.98 0.00 - 4.00 ng/mL      Assessment & Plan:   Problem List Items Addressed This Visit      Cardiovascular and Mediastinum   Essential hypertension - Primary    The current medical regimen is effective;  continue present plan and medications.       Relevant Orders   Basic metabolic panel     Respiratory   COPD (chronic obstructive pulmonary disease) ([place])    The current medical regimen is effective;  continue present plan and medications. a        Genitourinary   Prostate cancer [facility])    The current medical regimen is effective;  continue present plan and medications.         Other   Hypercholesteremia    The current medical regimen is effective;  continue present plan and medications.       Relevant Orders   [address] Waived  Chronic alcoholism ([place])    Discussed and limited drinking       Other Visit Diagnoses    Encounter for screening for malignant neoplasm of respiratory organs       Relevant Orders   CT CHEST LUNG CA SCREEN LOW DOSE W/O CM       Follow up plan: Return in about 6 months (around 02/03/2019) for Physical Exam.

## 2018-08-04 ENCOUNTER — Encounter: Payer: Self-pay | Admitting: Family Medicine

## 2018-08-04 ENCOUNTER — Telehealth: Payer: Self-pay | Admitting: *Deleted

## 2018-08-04 LAB — BASIC METABOLIC PANEL
BUN/Creatinine Ratio: 19 (ref 10–24)
BUN: 17 mg/dL (ref 8–27)
CALCIUM: 9.8 mg/dL (ref 8.6–10.2)
CO2: 27 mmol/L (ref 20–29)
Chloride: 103 mmol/L (ref 96–106)
Creatinine, Ser: 0.88 mg/dL (ref 0.76–1.27)
GFR calc Af Amer: 99 mL/min/{1.73_m2} (ref >59)
GFR, EST NON AFRICAN AMERICAN: 86 mL/min/{1.73_m2} (ref >59)
Glucose: 103 mg/dL — ABNORMAL HIGH (ref 65–99)
POTASSIUM: 4.5 mmol/L (ref 3.5–5.2)
Sodium: 144 mmol/L (ref 134–144)

## 2018-08-04 NOTE — Telephone Encounter (Signed)
Received referral for low dose lung cancer screening CT scan. Message left at phone number listed in EMR for patient to call me back to facilitate scheduling scan.  

## 2018-08-05 ENCOUNTER — Telehealth: Payer: Self-pay | Admitting: *Deleted

## 2018-08-05 DIAGNOSIS — Z87891 Personal history of nicotine dependence: Secondary | ICD-10-CM

## 2018-08-05 DIAGNOSIS — Z122 Encounter for screening for malignant neoplasm of respiratory organs: Secondary | ICD-10-CM

## 2018-08-05 NOTE — Telephone Encounter (Signed)
Received referral for initial lung cancer screening scan. Contacted patient and obtained smoking history,(current 81 pack year) as well as answering questions related to screening process. Patient denies signs of lung cancer such as weight loss or hemoptysis. Patient denies comorbidity that would prevent curative treatment if lung cancer were found. Patient is scheduled for shared decision making visit and CT scan on 08/23/18 at 145pm.

## 2018-08-08 ENCOUNTER — Telehealth: Payer: Self-pay | Admitting: Family Medicine

## 2018-08-08 LAB — SPECIMEN STATUS REPORT

## 2018-08-08 LAB — LIPID PANEL W/O CHOL/HDL RATIO
Cholesterol, Total: 194 mg/dL (ref 100–199)
HDL: 30 mg/dL — ABNORMAL LOW (ref >39)
Triglycerides: 642 mg/dL (ref 0–149)

## 2018-08-08 LAB — AST: AST: 16 IU/L (ref 0–40)

## 2018-08-08 LAB — ALT: ALT: 18 IU/L (ref 0–44)

## 2018-08-08 NOTE — Telephone Encounter (Signed)
-----   Message from Carl Allen, [place] sent at 08/08/2018  4:49 PM EDT ----- Patient was transferred to provider for telephone conversation.

## 2018-08-08 NOTE — Telephone Encounter (Signed)
Phone call Discussed with patient elevated triglycerides Discussed no alcohol cutting out white foods such as bread potatoes pasta rice sugars weight loss and recheck with office visit 1 month we will recheck lipid panel.

## 2018-08-18 ENCOUNTER — Telehealth: Payer: Self-pay | Admitting: *Deleted

## 2018-08-18 NOTE — Telephone Encounter (Signed)
Patient notified/message left to notify patient that due to current restrictions lung screening appointments are cancelled and patient will be contacted regarding rescheduling.  

## 2018-08-23 ENCOUNTER — Inpatient Hospital Stay: Payer: PPO | Admitting: Oncology

## 2018-08-23 ENCOUNTER — Ambulatory Visit: Payer: PPO

## 2018-08-24 ENCOUNTER — Ambulatory Visit: Payer: PPO | Admitting: Urology

## 2018-09-08 ENCOUNTER — Ambulatory Visit: Payer: PPO | Admitting: Family Medicine

## 2018-10-12 ENCOUNTER — Telehealth: Payer: Self-pay | Admitting: *Deleted

## 2018-10-12 NOTE — Telephone Encounter (Signed)
Contacted patient to reschedule lung screening scan. Patient request delay until September of this year. Patient is aware of risk of delaying screening.

## 2018-10-18 ENCOUNTER — Telehealth (INDEPENDENT_AMBULATORY_CARE_PROVIDER_SITE_OTHER): Payer: PPO | Admitting: Urology

## 2018-10-18 ENCOUNTER — Other Ambulatory Visit: Payer: Self-pay

## 2018-10-18 DIAGNOSIS — C61 Malignant neoplasm of prostate: Secondary | ICD-10-CM | POA: Diagnosis not present

## 2018-10-18 DIAGNOSIS — R350 Frequency of micturition: Secondary | ICD-10-CM

## 2018-10-18 DIAGNOSIS — R339 Retention of urine, unspecified: Secondary | ICD-10-CM

## 2018-10-18 NOTE — Progress Notes (Signed)
Virtual Visit via Telephone Note  I connected with Carl Allen on 10/18/18 at  1:00 PM EDT by telephone and verified that I am speaking with the correct person using two identifiers.  Location: Patient: home Provider: home   I discussed the limitations, risks, security and privacy concerns of performing an evaluation and management service by telephone and the availability of in person appointments. I also discussed with the patient that there may be a patient responsible charge related to this service. The patient expressed understanding and agreed to proceed.   History of Present Illness: 73 year old male with a personal history of intermediate risk prostate cancer who underwent implantation of brachytherapy seeds in 12/2017 he returns today via telephone encounter for routine follow-up.  He was initially diagnosed with low risk prostate cancer in 2018.  He underwent repeat biopsy which showed a more aggressive, Gleason 3+4 disease and 2 of the course thus ultimately elected to pursue treatment in the form of prostate seed implantation.  He was seen and evaluated by Dr. Donella Stade in January at which time his PSA was 0.98.  He was due for another follow-up in a few months for repeat PSA testing.  Postoperatively, he had significant urinary urgency and frequency as well as nocturia.  Over the past several months, he reports that the symptoms have waned.  He is now sleeping through the night.  His daytime urgency and frequency is dramatically improved although he reports that he is always "had a small bladder" and has to void more frequently during the day than his peers.  He continues to take Flomax.  He has not tried stopping this medication.  His stream is good.  No hesitancy or dribbling.  No dysuria or gross hematuria.  No UTIs.  He does have a personal history of incomplete bladder emptying with a borderline PVR in the office in the 150s.  He feels like he is emptying his bladder well at  this point in time and has no concern for urinary retention.   Observations/Objective: Pleasant, interactive.  Assessment and Plan:  1. Prostate cancer ([place]) Last PSA significantly lower after seed implantation, will be due for repeat PSA in about 2 months with Dr. Donella Stade Continue to monitor  2. Urinary frequency Improved We discussed taking a break from Flomax for about a week to see if he truly needs this medication any longer, discussed what symptoms he may expect and indications to resume the medication He is agreeable this plan He will return sooner for symptoms worsen  3. Incomplete bladder emptying Personal history of mildly elevated post void residual Overall his urinary symptoms have improved We will recheck a PVR at his next visit   Follow Up Instructions: 9 months with PVR/ IPSS   I discussed the assessment and treatment plan with the patient. The patient was provided an opportunity to ask questions and all were answered. The patient agreed with the plan and demonstrated an understanding of the instructions.   The patient was advised to call back or seek an in-person evaluation if the symptoms worsen or if the condition fails to improve as anticipated.  I provided 12 minutes of non-face-to-face time during this encounter.   Hollice Espy, MD

## 2019-02-01 ENCOUNTER — Ambulatory Visit (INDEPENDENT_AMBULATORY_CARE_PROVIDER_SITE_OTHER): Payer: PPO

## 2019-02-01 VITALS — BP 128/80 | Ht 67.0 in | Wt 159.0 lb

## 2019-02-01 DIAGNOSIS — Z0000 Encounter for general adult medical examination without abnormal findings: Secondary | ICD-10-CM | POA: Diagnosis not present

## 2019-02-01 NOTE — Patient Instructions (Signed)
Mr. Carl Allen , Thank you for taking time to come for your Medicare Wellness Visit. I appreciate your ongoing commitment to your health goals. Please review the following plan we discussed and let me know if I can assist you in the future.   Screening recommendations/referrals: Colonoscopy: up to date  Recommended yearly ophthalmology/optometry visit for glaucoma screening and checkup Recommended yearly dental visit for hygiene and checkup  Vaccinations: Influenza vaccine: due now Pneumococcal vaccine: up to date Tdap vaccine: up to date Shingles vaccine: shingrix eligible     Advanced directives: please let me know if you have any questions or need assistance with this paperwork   Conditions/risks identified: If you wish to quit smoking, help is available. For free tobacco cessation program offerings call the [facility] at [phone] or Live Well Line at 807-142-1908. You may also visit [email or web address] or email [email or web address] for more information on other programs.   Next appointment: Follow up in one year for your annual   Preventive Care 65 Years and Older, Male Preventive care refers to lifestyle choices and visits with your health care provider that can promote health and wellness. What does preventive care include?  A yearly physical exam. This is also called an annual well check.  Dental exams once or twice a year.  Routine eye exams. Ask your health care provider how often you should have your eyes checked.  Personal lifestyle choices, including:  Daily care of your teeth and gums.  Regular physical activity.  Eating a healthy diet.  Avoiding tobacco and drug use.  Limiting alcohol use.  Practicing safe sex.  Taking low doses of aspirin every day.  Taking vitamin and mineral supplements as recommended by your health care provider. What happens during an annual well check? The services and screenings done by your health care  provider during your annual well check will depend on your age, overall health, lifestyle risk factors, and family history of disease. Counseling  Your health care provider may ask you questions about your:  Alcohol use.  Tobacco use.  Drug use.  Emotional well-being.  Home and relationship well-being.  Sexual activity.  Eating habits.  History of falls.  Memory and ability to understand (cognition).  Work and work Statistician. Screening  You may have the following tests or measurements:  Height, weight, and BMI.  Blood pressure.  Lipid and cholesterol levels. These may be checked every 5 years, or more frequently if you are over 51 years old.  Skin check.  Lung cancer screening. You may have this screening every year starting at age 38 if you have a 30-pack-year history of smoking and currently smoke or have quit within the past 15 years.  Fecal occult blood test (FOBT) of the stool. You may have this test every year starting at age 21.  Flexible sigmoidoscopy or colonoscopy. You may have a sigmoidoscopy every 5 years or a colonoscopy every 10 years starting at age 47.  Prostate cancer screening. Recommendations will vary depending on your family history and other risks.  Hepatitis C blood test.  Hepatitis B blood test.  Sexually transmitted disease (STD) testing.  Diabetes screening. This is done by checking your blood sugar (glucose) after you have not eaten for a while (fasting). You may have this done every 1-3 years.  Abdominal aortic aneurysm (AAA) screening. You may need this if you are a current or former smoker.  Osteoporosis. You may be screened starting at age 54 if you are at  high risk. Talk with your health care provider about your test results, treatment options, and if necessary, the need for more tests. Vaccines  Your health care provider may recommend certain vaccines, such as:  Influenza vaccine. This is recommended every year.  Tetanus,  diphtheria, and acellular pertussis (Tdap, Td) vaccine. You may need a Td booster every 10 years.  Zoster vaccine. You may need this after age 64.  Pneumococcal 13-valent conjugate (PCV13) vaccine. One dose is recommended after age 67.  Pneumococcal polysaccharide (PPSV23) vaccine. One dose is recommended after age 15. Talk to your health care provider about which screenings and vaccines you need and how often you need them. This information is not intended to replace advice given to you by your health care provider. Make sure you discuss any questions you have with your health care provider. Document Released: 06/14/2015 Document Revised: 02/05/2016 Document Reviewed: 03/19/2015 Elsevier Interactive Patient Education  2017 [place] Prevention in the Home Falls can cause injuries. They can happen to people of all ages. There are many things you can do to make your home safe and to help prevent falls. What can I do on the outside of my home?  Regularly fix the edges of walkways and driveways and fix any cracks.  Remove anything that might make you trip as you walk through a door, such as a raised step or threshold.  Trim any bushes or trees on the path to your home.  Use bright outdoor lighting.  Clear any walking paths of anything that might make someone trip, such as rocks or tools.  Regularly check to see if handrails are loose or broken. Make sure that both sides of any steps have handrails.  Any raised decks and porches should have guardrails on the edges.  Have any leaves, snow, or ice cleared regularly.  Use sand or salt on walking paths during winter.  Clean up any spills in your garage right away. This includes oil or grease spills. What can I do in the bathroom?  Use night lights.  Install grab bars by the toilet and in the tub and shower. Do not use towel bars as grab bars.  Use non-skid mats or decals in the tub or shower.  If you need to sit down in  the shower, use a plastic, non-slip stool.  Keep the floor dry. Clean up any water that spills on the floor as soon as it happens.  Remove soap buildup in the tub or shower regularly.  Attach bath mats securely with double-sided non-slip rug tape.  Do not have throw rugs and other things on the floor that can make you trip. What can I do in the bedroom?  Use night lights.  Make sure that you have a light by your bed that is easy to reach.  Do not use any sheets or blankets that are too big for your bed. They should not hang down onto the floor.  Have a firm chair that has side arms. You can use this for support while you get dressed.  Do not have throw rugs and other things on the floor that can make you trip. What can I do in the kitchen?  Clean up any spills right away.  Avoid walking on wet floors.  Keep items that you use a lot in easy-to-reach places.  If you need to reach something above you, use a strong step stool that has a grab bar.  Keep electrical cords out of the  way.  Do not use floor polish or wax that makes floors slippery. If you must use wax, use non-skid floor wax.  Do not have throw rugs and other things on the floor that can make you trip. What can I do with my stairs?  Do not leave any items on the stairs.  Make sure that there are handrails on both sides of the stairs and use them. Fix handrails that are broken or loose. Make sure that handrails are as long as the stairways.  Check any carpeting to make sure that it is firmly attached to the stairs. Fix any carpet that is loose or worn.  Avoid having throw rugs at the top or bottom of the stairs. If you do have throw rugs, attach them to the floor with carpet tape.  Make sure that you have a light switch at the top of the stairs and the bottom of the stairs. If you do not have them, ask someone to add them for you. What else can I do to help prevent falls?  Wear shoes that:  Do not have high  heels.  Have rubber bottoms.  Are comfortable and fit you well.  Are closed at the toe. Do not wear sandals.  If you use a stepladder:  Make sure that it is fully opened. Do not climb a closed stepladder.  Make sure that both sides of the stepladder are locked into place.  Ask someone to hold it for you, if possible.  Clearly mark and make sure that you can see:  Any grab bars or handrails.  First and last steps.  Where the edge of each step is.  Use tools that help you move around (mobility aids) if they are needed. These include:  Canes.  Walkers.  Scooters.  Crutches.  Turn on the lights when you go into a dark area. Replace any light bulbs as soon as they burn out.  Set up your furniture so you have a clear path. Avoid moving your furniture around.  If any of your floors are uneven, fix them.  If there are any pets around you, be aware of where they are.  Review your medicines with your doctor. Some medicines can make you feel dizzy. This can increase your chance of falling. Ask your doctor what other things that you can do to help prevent falls. This information is not intended to replace advice given to you by your health care provider. Make sure you discuss any questions you have with your health care provider. Document Released: 03/14/2009 Document Revised: 10/24/2015 Document Reviewed: 06/22/2014 Elsevier Interactive Patient Education  2017 [employer].

## 2019-02-01 NOTE — Progress Notes (Signed)
Subjective:   Carl Allen is a 73 y.o. male who presents for Medicare Annual/Subsequent preventive examination.  This visit is being conducted via phone call  - after an attmept to do on video chat - due to the COVID-19 pandemic. This patient has given me verbal consent via phone to conduct this visit, patient states they are participating from their home address. Some vital signs may be absent or patient reported.   Patient identification: identified by name, DOB, and current address.    Review of Systems:   Cardiac Risk Factors include: advanced age (>71men, >37 women);male gender;smoking/ tobacco exposure;hypertension     Objective:    Vitals: BP 128/80 Comment: pt reported  Ht 5\' 7"  (1.702 m) Comment: pt reported  Wt 159 lb (72.1 kg) Comment: pt reported  BMI 24.90 kg/m   Body mass index is 24.9 kg/m.  Advanced Directives 02/01/2019 07/18/2018 02/14/2018 01/26/2018 01/03/2018 12/13/2017 10/07/2017  Does Patient Have a Medical Advance Directive? No No - No No No No  Would patient like information on creating a medical advance directive? - No - Patient declined No - Patient declined Yes (MAU/Ambulatory/Procedural Areas - Information given) No - Patient declined No - Patient declined No - Patient declined    Tobacco Social History   Tobacco Use  Smoking Status Current Every Day Smoker  . Packs/day: 1.00  Smokeless Tobacco Never Used     Ready to quit: Yes Counseling given: Yes   Clinical Intake:  Pre-visit preparation completed: Yes  Pain : No/denies pain Pain Score: 0-No pain     Nutritional Status: BMI of 19-24  Normal Nutritional Risks: None Diabetes: No  How often do you need to have someone help you when you read instructions, pamphlets, or other written materials from your doctor or pharmacy?: 1 - Never  Interpreter Needed?: No  Information entered by :: Lamont Glasscock,LPN  Past Medical History:  Diagnosis Date  . Allergy   . Anxiety    not often  .  Arthritis    fingers, knees  . Cancer ([place])   . Cataract   . Chronic alcoholism ([place])   . COPD (chronic obstructive pulmonary disease) ([place])   . Depression    pt states sometimes  . Dyspnea   . Emphysema of lung ([place])   . GERD (gastroesophageal reflux disease)    pt states every now and then  . Hypertension   . Hypertriglyceridemia    Past Surgical History:  Procedure Laterality Date  . CATARACT EXTRACTION, BILATERAL  2014  . COLONOSCOPY  2012  . CYSTOSCOPY N/A 01/10/2018   Procedure: CYSTOSCOPY;  Surgeon: Hollice Espy, MD;  Location: [facility] ORS;  Service: Urology;  Laterality: N/A;  . RADIOACTIVE SEED IMPLANT N/A 01/10/2018   Procedure: RADIOACTIVE SEED IMPLANT/BRACHYTHERAPY IMPLANT;  Surgeon: Hollice Espy, MD;  Location: [facility] ORS;  Service: Urology;  Laterality: N/A;  . TONSILLECTOMY    . TONSILLECTOMY AND ADENOIDECTOMY  1952   Family History  Problem Relation Age of Onset  . Arthritis Mother   . Heart disease Mother   . Hearing loss Mother   . Hyperlipidemia Mother   . Hypertension Mother   . Kidney disease Mother   . Miscarriages / [place] Mother   . Vision loss Mother   . Stroke Father   . Heart disease Maternal Grandmother   . Stroke Maternal Grandfather   . Cancer Paternal Grandfather    Social History   Socioeconomic History  . Marital status: Single  Spouse name: Not on file  . Number of children: Not on file  . Years of education: college  . Highest education level: Bachelor's degree (e.g., BA, AB, BS)  Occupational History    Comment: Retired  Scientific laboratory technician  . Financial resource strain: Not hard at all  . Food insecurity    Worry: Never true    Inability: Never true  . Transportation needs    Medical: No    Non-medical: No  Tobacco Use  . Smoking status: Current Every Day Smoker    Packs/day: 1.00  . Smokeless tobacco: Never Used  Substance and Sexual Activity  . Alcohol use: Yes    Alcohol/week: 3.0 standard drinks    Types: 3 Shots  of liquor per week    Comment: pt is currently trying to quit  . Drug use: No  . Sexual activity: Not Currently  Lifestyle  . Physical activity    Days per week: 0 days    Minutes per session: 0 min  . Stress: Not at all  Relationships  . Social connections    Talks on phone: More than three times a week    Gets together: More than three times a week    Attends religious service: Never    Active member of club or organization: No    Attends meetings of clubs or organizations: Never    Relationship status: Never married  Other Topics Concern  . Not on file  Social History Narrative  . Not on file    Outpatient Encounter Medications as of 02/01/2019  Medication Sig  . amLODipine (NORVASC) 5 MG tablet Take 1 tablet (5 mg total) by mouth daily.  . benazepril (LOTENSIN) 40 MG tablet Take 1 tablet (40 mg total) by mouth daily.  . cetirizine (ZYRTEC) 10 MG tablet Take 10 mg by mouth daily as needed for allergies.  . fenofibrate micronized (LOFIBRA) 134 MG capsule Take 1 capsule (134 mg total) by mouth daily before breakfast.  . ferrous sulfate 325 (65 FE) MG tablet Take 325 mg by mouth daily with breakfast.  . hydrochlorothiazide (HYDRODIURIL) 25 MG tablet Take 1 tablet (25 mg total) by mouth daily.  . Multiple Vitamins-Minerals (CENTRUM SILVER 50+MEN) TABS Take 1 tablet by mouth daily.  [name] tiotropium (SPIRIVA HANDIHALER) 18 MCG inhalation capsule Place 1 capsule (18 mcg total) into inhaler and inhale every morning.  . docusate sodium (COLACE) 100 MG capsule Take 1 capsule (100 mg total) by mouth 2 (two) times daily as needed (take to keep stool soft.). (Patient not taking: Reported on 02/01/2019)  . [DISCONTINUED] tamsulosin (FLOMAX) 0.4 MG CAPS capsule Take 1 capsule (0.4 mg total) by mouth daily. (Patient not taking: Reported on 02/01/2019)   No facility-administered encounter medications on file as of 02/01/2019.     Activities of Daily Living In your present state of health, do you have  any difficulty performing the following activities: 02/01/2019  Hearing? N  Comment no hearing aids  Vision? N  Comment glasses, goes to Dr.Dingledin  Difficulty concentrating or making decisions? N  Walking or climbing stairs? N  Dressing or bathing? N  Doing errands, shopping? N  Preparing Food and eating ? N  Using the Toilet? N  In the past six months, have you accidently leaked urine? N  Do you have problems with loss of bowel control? N  Managing your Medications? N  Managing your Finances? N  Housekeeping or managing your Housekeeping? N  Some recent data might be hidden  Patient Care Team: [name], MD as PCP - General (Family Medicine) Erby Pian, MD as Referring Physician (Specialist) Teodoro Spray, MD as Consulting Physician (Cardiology) Noreene Filbert, MD as Referring Physician (Radiation Oncology) Hollice Espy, MD as Consulting Physician (Urology)   Assessment:   This is a routine wellness examination for [place].  Exercise Activities and Dietary recommendations Current Exercise Habits: The patient does not participate in regular exercise at present, Exercise limited by: respiratory conditions(s)  Goals    . Quit smoking / using tobacco     Smoking cessation discussed       Fall Risk: Fall Risk  02/01/2019 02/02/2018 01/26/2018 01/26/2018 09/29/2017  Falls in the past year? 0 No No No No    FALL RISK PREVENTION PERTAINING TO THE HOME:  Any stairs in or around the home? Yes  If so, are there any without handrails? No   Home free of loose throw rugs in walkways, pet beds, electrical cords, etc? Yes  Adequate lighting in your home to reduce risk of falls? Yes   ASSISTIVE DEVICES UTILIZED TO PREVENT FALLS:  Life alert? No  Use of a cane, walker or w/c? No  Grab bars in the bathroom? No  Shower chair or bench in shower? No  Elevated toilet seat or a handicapped toilet? Yes   TIMED UP AND GO:  Unable to perform  Depression Screen PHQ  2/9 Scores 02/01/2019 01/26/2018 01/26/2018 12/13/2017  PHQ - 2 Score 0 0 0 0    Cognitive Function     6CIT Screen 01/26/2018 01/06/2017  What Year? 0 points 0 points  What month? 0 points 0 points  What time? 0 points 0 points  Count back from 20 0 points 0 points  Months in reverse 0 points 0 points  Repeat phrase 0 points 0 points  Total Score 0 0    Immunization History  Administered Date(s) Administered  . Influenza, High Dose Seasonal PF 04/07/2017, 02/02/2018  . Influenza-Unspecified 02/09/2015, 02/24/2016  . Pneumococcal Conjugate-13 12/21/2013  . Pneumococcal Polysaccharide-23 03/22/2008, 02/13/2013  . Td 09/20/2001  . Tdap 01/08/2016  . Zoster 12/18/2010    Qualifies for Shingles Vaccine? Yes  Zostavax completed n/a. Due for Shingrix. Education has been provided regarding the importance of this vaccine. Pt has been advised to call insurance company to determine out of pocket expense. Advised may also receive vaccine at local pharmacy or Health Dept. Verbalized acceptance and understanding.  Tdap:  Up to date   Flu Vaccine: Due for Flu vaccine.  Pneumococcal Vaccine: up to date   Screening Tests Health Maintenance  Topic Date Due  . INFLUENZA VACCINE  12/31/2018  . COLONOSCOPY  07/02/2020  . TETANUS/TDAP  01/07/2026  . Hepatitis C Screening  Completed  . PNA vac Low Risk Adult  Completed   Cancer Screenings:  Colorectal Screening: Completed 07/02/2010. Repeat every 10 years  Lung Cancer Screening: (Low Dose CT Chest recommended if Age 51-80 years, 30 pack-year currently smoking OR have quit w/in 15years.) does qualify.   Ordered, patient waiting until things settle with covid-19   Additional Screening:  Hepatitis C Screening: does qualify; Completed 01/08/2016  Vision Screening: Recommended annual ophthalmology exams for early detection of glaucoma and other disorders of the eye. Is the patient up to date with their annual eye exam?  Yes  Who is the provider  or what is the name of the office in which the pt attends annual eye exams? Dr.Dingledin    Dental Screening:  Recommended annual dental exams for proper oral hygiene  Community Resource Referral:  CRR required this visit?  No        Plan:  I have personally reviewed and addressed the Medicare Annual Wellness questionnaire and have noted the following in the patient's chart:  A. Medical and social history B. Use of alcohol, tobacco or illicit drugs  C. Current medications and supplements D. Functional ability and status E.  Nutritional status F.  Physical activity G. Advance directives H. List of other physicians I.  Hospitalizations, surgeries, and ER visits in previous 12 months J.  [place] such as hearing and vision if needed, cognitive and depression L. Referrals and appointments   In addition, I have reviewed and discussed with patient certain preventive protocols, quality metrics, and best practice recommendations. A written personalized care plan for preventive services as well as general preventive health recommendations were provided to patient.   Signed,   Bevelyn Ngo, LPN  [id number] Nurse Health Advisor   Nurse Notes: none

## 2019-02-07 ENCOUNTER — Encounter: Payer: PPO | Admitting: Family Medicine

## 2019-02-08 ENCOUNTER — Other Ambulatory Visit: Payer: Self-pay

## 2019-02-08 DIAGNOSIS — C61 Malignant neoplasm of prostate: Secondary | ICD-10-CM

## 2019-02-09 ENCOUNTER — Other Ambulatory Visit: Payer: Self-pay

## 2019-02-09 ENCOUNTER — Inpatient Hospital Stay: Payer: PPO | Attending: Radiation Oncology

## 2019-02-09 DIAGNOSIS — C61 Malignant neoplasm of prostate: Secondary | ICD-10-CM | POA: Diagnosis not present

## 2019-02-09 LAB — PSA: Prostatic Specific Antigen: 0.96 ng/mL (ref 0.00–4.00)

## 2019-02-13 ENCOUNTER — Ambulatory Visit (INDEPENDENT_AMBULATORY_CARE_PROVIDER_SITE_OTHER): Payer: PPO | Admitting: Family Medicine

## 2019-02-13 ENCOUNTER — Encounter: Payer: Self-pay | Admitting: Family Medicine

## 2019-02-13 ENCOUNTER — Other Ambulatory Visit: Payer: Self-pay

## 2019-02-13 DIAGNOSIS — J410 Simple chronic bronchitis: Secondary | ICD-10-CM | POA: Diagnosis not present

## 2019-02-13 DIAGNOSIS — E7800 Pure hypercholesterolemia, unspecified: Secondary | ICD-10-CM

## 2019-02-13 DIAGNOSIS — Z7189 Other specified counseling: Secondary | ICD-10-CM

## 2019-02-13 DIAGNOSIS — C61 Malignant neoplasm of prostate: Secondary | ICD-10-CM | POA: Diagnosis not present

## 2019-02-13 DIAGNOSIS — I10 Essential (primary) hypertension: Secondary | ICD-10-CM | POA: Diagnosis not present

## 2019-02-13 MED ORDER — HYDROCHLOROTHIAZIDE 25 MG PO TABS: 25.0000 mg | ORAL_TABLET | Freq: Every day | ORAL | 4 refills | Status: DC

## 2019-02-13 MED ORDER — SPIRIVA HANDIHALER 18 MCG IN CAPS: 18.0000 ug | ORAL_CAPSULE | Freq: Every morning | RESPIRATORY_TRACT | 4 refills | Status: DC

## 2019-02-13 MED ORDER — AMLODIPINE BESYLATE 5 MG PO TABS: 5.0000 mg | ORAL_TABLET | Freq: Every day | ORAL | 4 refills | Status: DC

## 2019-02-13 MED ORDER — BENAZEPRIL HCL 40 MG PO TABS: 40.0000 mg | ORAL_TABLET | Freq: Every day | ORAL | 4 refills | Status: DC

## 2019-02-13 MED ORDER — FENOFIBRATE MICRONIZED 134 MG PO CAPS: 134.0000 mg | ORAL_CAPSULE | Freq: Every day | ORAL | 4 refills | Status: DC

## 2019-02-13 NOTE — Progress Notes (Signed)
   BP 120/80    Subjective:    Patient ID: Carl Allen, male    DOB: 03-13-1946, 73 y.o.   MRN: [id number]  HPI: Carl Allen is a 73 y.o. male  Med check Patient doing well blood pressure good control home checks indicate good readings. Unfortunately patient still smoking but breathing is doing okay. Getting good reports from radiation for prostate cancer with PSA dropping. Cholesterol doing well with no problems.   Relevant past medical, surgical, family and social history reviewed and updated as indicated. Interim medical history since our last visit reviewed. Allergies and medications reviewed and updated.  Review of Systems  Constitutional: Negative.   HENT: Negative.   Eyes: Negative.   Respiratory: Negative.   Cardiovascular: Negative.   Gastrointestinal: Negative.   Endocrine: Negative.   Genitourinary: Negative.   Musculoskeletal: Negative.   Skin: Negative.   Allergic/Immunologic: Negative.   Neurological: Negative.   Hematological: Negative.   Psychiatric/Behavioral: Negative.     Per HPI unless specifically indicated above     Objective:    BP 120/80   Wt Readings from Last 3 Encounters:  02/01/19 159 lb (72.1 kg)  08/03/18 159 lb 12.8 oz (72.5 kg)  02/23/18 160 lb (72.6 kg)    Physical Exam  Results for orders placed or performed in visit on 02/09/19  PSA  Result Value Ref Range   Prostatic Specific Antigen 0.96 0.00 - 4.00 ng/mL      Assessment & Plan:   Problem List Items Addressed This Visit      Cardiovascular and Mediastinum   Essential hypertension    The current medical regimen is effective;  continue present plan and medications.         Respiratory   COPD (chronic obstructive pulmonary disease) (HCC)    The current medical regimen is effective;  continue present plan and medications.         Genitourinary   Prostate cancer [facility])    Doing well        Other   Hypercholesteremia    The current medical regimen is  effective;  continue present plan and medications.       Advanced care planning/counseling discussion    A voluntary discussion about advanced care planning including explanation and discussion of advanced directives was extentively discussed with the patient.  Explained about the healthcare proxy and living will was reviewed and packet with forms with expiration of how to fill them out was given.  Time spent: Encounter 16+ min individuals present: Patient          Follow up plan: Return for Physical Exam.

## 2019-02-13 NOTE — Assessment & Plan Note (Signed)
The current medical regimen is effective;  continue present plan and medications.  

## 2019-02-13 NOTE — Assessment & Plan Note (Signed)
Doing well 

## 2019-02-13 NOTE — Assessment & Plan Note (Signed)
A voluntary discussion about advanced care planning including explanation and discussion of advanced directives was extentively discussed with the patient.  Explained about the healthcare proxy and living will was reviewed and packet with forms with expiration of how to fill them out was given.  Time spent: Encounter 16+ min individuals present: Patient 

## 2019-02-16 ENCOUNTER — Ambulatory Visit
Admission: RE | Admit: 2019-02-16 | Discharge: 2019-02-16 | Disposition: A | Discharge status: Home / Self Care | Payer: PPO | Source: Ambulatory Visit | Attending: Radiation Oncology | Admitting: Radiation Oncology

## 2019-02-16 ENCOUNTER — Encounter: Payer: Self-pay | Admitting: Radiation Oncology

## 2019-02-16 ENCOUNTER — Other Ambulatory Visit: Payer: Self-pay

## 2019-02-16 VITALS — BP 141/83 | HR 83 | Temp 98.0°F | Resp 16 | Wt 165.3 lb

## 2019-02-16 DIAGNOSIS — Z923 Personal history of irradiation: Secondary | ICD-10-CM | POA: Insufficient documentation

## 2019-02-16 DIAGNOSIS — C61 Malignant neoplasm of prostate: Secondary | ICD-10-CM | POA: Diagnosis not present

## 2019-02-16 NOTE — Progress Notes (Signed)
Radiation Oncology Follow up Note  Name: Carl Allen   Date:   02/16/2019 MRN:  [id number] DOB: 1946/04/19    This 73 y.o. male presents to the clinic today for 1 year follow-up status post I-125 interstitial implant for Gleason 6 adenocarcinoma the prostate.  REFERRING PROVIDER: [name], MD  HPI: Patient is a 73 year old male now about a 1 year having completed I-125 interstitial implant for Gleason 6 (3+3) adenocarcinoma the prostate presenting with a PSA of 5.  Seen today in routine follow-up he is doing well.  He specifically denies any increased lower urinary tract symptoms diarrhea or fatigue.  His most recent PSA is 0.96 unchanged from.  7 months prior.  COMPLICATIONS OF TREATMENT: none  FOLLOW UP COMPLIANCE: keeps appointments   PHYSICAL EXAM:  BP (!) 141/83 (BP Location: Left Arm, Patient Position: Sitting)   Pulse 83   Temp 98 F (36.7 C) (Tympanic)   Resp 16   Wt 165 lb 4.8 oz (75 kg)   BMI 25.89 kg/m  Well-developed well-nourished patient in NAD. HEENT reveals PERLA, EOMI, discs not visualized.  Oral cavity is clear. No oral mucosal lesions are identified. Neck is clear without evidence of cervical or supraclavicular adenopathy. Lungs are clear to A&P. Cardiac examination is essentially unremarkable with regular rate and rhythm without murmur rub or thrill. Abdomen is benign with no organomegaly or masses noted. Motor sensory and DTR levels are equal and symmetric in the upper and lower extremities. Cranial nerves II through XII are grossly intact. Proprioception is intact. No peripheral adenopathy or edema is identified. No motor or sensory levels are noted. Crude visual fields are within normal range.  RADIOLOGY RESULTS: No current films for review  PLAN: Present time patient is doing well under excellent biochemical control of his prostate cancer.  I am pleased with his overall progress.  I have asked to see him back in 1 year for follow-up with a PSA prior to  that visit.  Patient knows to call with any concerns at any time.  I would like to take this opportunity to thank you for allowing me to participate in the care of your patient.Noreene Filbert, MD

## 2019-03-07 ENCOUNTER — Other Ambulatory Visit: Payer: Self-pay

## 2019-03-07 ENCOUNTER — Ambulatory Visit (INDEPENDENT_AMBULATORY_CARE_PROVIDER_SITE_OTHER): Payer: PPO

## 2019-03-07 DIAGNOSIS — Z23 Encounter for immunization: Secondary | ICD-10-CM

## 2019-04-17 ENCOUNTER — Telehealth: Payer: Self-pay | Admitting: *Deleted

## 2019-04-17 NOTE — Telephone Encounter (Signed)
Received referral for low dose lung cancer screening CT scan. Message left at phone number listed in EMR for patient to call me back to facilitate scheduling scan.  

## 2019-04-21 ENCOUNTER — Encounter: Payer: PPO | Admitting: Family Medicine

## 2019-04-24 ENCOUNTER — Encounter: Payer: Self-pay | Admitting: *Deleted

## 2019-06-21 ENCOUNTER — Encounter: Payer: Self-pay | Admitting: *Deleted

## 2019-06-29 ENCOUNTER — Telehealth: Payer: Self-pay | Admitting: Family Medicine

## 2019-06-29 NOTE — Chronic Care Management (AMB) (Signed)
  Chronic Care Management   Note  06/29/2019 Name: SAIVION GOETTEL MRN: [id number] DOB: May 09, 1946  KINGSLEE MAIRENA is a 74 y.o. year old male who is a primary care patient of Crissman, Jeannette How, MD. I reached out to [name] by phone today in response to a referral sent by Mr. CASSIEL FERNANDEZ health plan.     Mr. Vanatta was given information about Chronic Care Management services today including:  1. CCM service includes personalized support from designated clinical staff supervised by his physician, including individualized plan of care and coordination with other care providers 2. 24/7 contact phone numbers for assistance for urgent and routine care needs. 3. Service will only be billed when office clinical staff spend 20 minutes or more in a month to coordinate care. 4. Only one practitioner may furnish and bill the service in a calendar month. 5. The patient may stop CCM services at any time (effective at the end of the month) by phone call to the office staff. 6. The patient will be responsible for cost sharing (co-pay) of up to 20% of the service fee (after annual deductible is met).  Patient agreed to services and verbal consent obtained.   Follow up plan: Telephone appointment with care management team member scheduled for:07/28/2019  Noreene Larsson, [address], [place], [place] [address] Direct Dial: [phone] [email or web address] Website: [email or web address]

## 2019-07-26 ENCOUNTER — Ambulatory Visit: Payer: PPO | Admitting: Urology

## 2019-07-28 ENCOUNTER — Telehealth: Payer: PPO

## 2019-07-31 ENCOUNTER — Encounter: Payer: PPO | Admitting: Family Medicine

## 2019-08-17 ENCOUNTER — Inpatient Hospital Stay: Payer: PPO

## 2019-08-21 ENCOUNTER — Other Ambulatory Visit: Payer: Self-pay

## 2019-08-21 ENCOUNTER — Inpatient Hospital Stay: Payer: PPO | Attending: Radiation Oncology

## 2019-08-21 DIAGNOSIS — C61 Malignant neoplasm of prostate: Secondary | ICD-10-CM | POA: Insufficient documentation

## 2019-08-21 LAB — PSA: Prostatic Specific Antigen: 0.63 ng/mL (ref 0.00–4.00)

## 2019-08-22 NOTE — Progress Notes (Signed)
08/23/19 12:49 PM   Carl Allen December 06, 1945 [id number]  Referring provider: [name], MD No address on file  Chief Complaint  Patient presents with  . Follow-up    HPI: Carl Allen is a 74 y.o. with a personal history of intermediate risk prostate cancer who underwent implantation of brachytherapy seeds in 12/2017 returns today for 9 month f/u.   He was initially diagnosed with low risk prostate cancer in 2018. He underwent repeat biopsy which showed a more aggressive, Gleason 3+4 disease and 2 of the course thus ultimately elected to pursue treatment in the form of prostate seed implantation.  He has discontinued use of Flomax and reports of well controlled urinary symptoms including improved nocturia. IPSS as below.  Please with urinary symptoms.  He does have a personal history of incomplete bladder emptying with a borderline PVR in the office in the 150s.  He feels like he is emptying his bladder well at this point in time and has no concern for urinary retention. His PVR today is 20 mL.   His most recent PSA 0.63, 08/21/19 down from previous PSA of 0.96, 02/09/19.    IPSS    Row Name 08/23/19 1000         International Prostate Symptom Score   How often have you had the sensation of not emptying your bladder?  Less than half the time     How often have you had to urinate less than every two hours?  About half the time     How often have you found you stopped and started again several times when you urinated?  Not at All     How often have you found it difficult to postpone urination?  Less than 1 in 5 times     How often have you had a weak urinary stream?  Less than 1 in 5 times     How often have you had to strain to start urination?  Less than 1 in 5 times     How many times did you typically get up at night to urinate?  None     Total IPSS Score  8       Quality of Life due to urinary symptoms   If you were to spend the rest of your life with your  urinary condition just the way it is now how would you feel about that?  Mostly Satisfied        Score:  1-7 Mild 8-19 Moderate 20-35 Severe  PMH: Past Medical History:  Diagnosis Date  . Allergy   . Anxiety    not often  . Arthritis    fingers, knees  . Cancer ([place])   . Cataract   . Chronic alcoholism ([place])   . COPD (chronic obstructive pulmonary disease) ([place])   . Depression    pt states sometimes  . Dyspnea   . Emphysema of lung ([place])   . GERD (gastroesophageal reflux disease)    pt states every now and then  . Hypertension   . Hypertriglyceridemia     Surgical History: Past Surgical History:  Procedure Laterality Date  . CATARACT EXTRACTION, BILATERAL  2014  . COLONOSCOPY  2012  . CYSTOSCOPY N/A 01/10/2018   Procedure: CYSTOSCOPY;  Surgeon: Hollice Espy, MD;  Location: [facility] ORS;  Service: Urology;  Laterality: N/A;  . RADIOACTIVE SEED IMPLANT N/A 01/10/2018   Procedure: RADIOACTIVE SEED IMPLANT/BRACHYTHERAPY IMPLANT;  Surgeon: Hollice Espy, MD;  Location: [facility] ORS;  Service: Urology;  Laterality: N/A;  . TONSILLECTOMY    . TONSILLECTOMY AND ADENOIDECTOMY  1952    Home Medications:  Allergies as of 08/23/2019   No Known Allergies     Medication List       Accurate as of August 23, 2019 12:49 PM. If you have any questions, ask your nurse or doctor.        amLODipine 5 MG tablet Commonly known as: NORVASC Take 1 tablet (5 mg total) by mouth daily.   benazepril 40 MG tablet Commonly known as: LOTENSIN Take 1 tablet (40 mg total) by mouth daily.   Centrum Silver 50+Men Tabs Take 1 tablet by mouth daily.   cetirizine 10 MG tablet Commonly known as: ZYRTEC Take 10 mg by mouth daily as needed for allergies.   docusate sodium 100 MG capsule Commonly known as: COLACE Take 1 capsule (100 mg total) by mouth 2 (two) times daily as needed (take to keep stool soft.).   fenofibrate micronized 134 MG capsule Commonly known as: Lofibra Take 1 capsule  (134 mg total) by mouth daily before breakfast.   ferrous sulfate 325 (65 FE) MG tablet Take 325 mg by mouth daily with breakfast.   hydrochlorothiazide 25 MG tablet Commonly known as: HYDRODIURIL Take 1 tablet (25 mg total) by mouth daily.   Spiriva HandiHaler 18 MCG inhalation capsule Generic drug: tiotropium Place 1 capsule (18 mcg total) into inhaler and inhale every morning.       Allergies: No Known Allergies  Family History: Family History  Problem Relation Age of Onset  . Arthritis Mother   . Heart disease Mother   . Hearing loss Mother   . Hyperlipidemia Mother   . Hypertension Mother   . Kidney disease Mother   . Miscarriages / [place] Mother   . Vision loss Mother   . Stroke Father   . Heart disease Maternal Grandmother   . Stroke Maternal Grandfather   . Cancer Paternal Grandfather     Social History:  reports that he has been smoking. He has been smoking about 1.00 pack per day. He has never used smokeless tobacco. He reports current alcohol use of about 3.0 standard drinks of alcohol per week. He reports that he does not use drugs.   Physical Exam: BP (!) 142/78   Pulse 88   Ht 5\' 7"  (1.702 m)   Wt 164 lb (74.4 kg)   BMI 25.69 kg/m   Constitutional:  Alert and oriented, No acute distress. HEENT: [place] AT, moist mucus membranes.  Trachea midline, no masses. Cardiovascular: No clubbing, cyanosis, or edema. Respiratory: Normal respiratory effort, no increased work of breathing. Skin: No rashes, bruises or suspicious lesions. Neurologic: Grossly intact, no focal deficits, moving all 4 extremities. Psychiatric: Normal mood and affect.  Laboratory Data:  Pertinent Imaging: Results for orders placed or performed in visit on 08/23/19  BLADDER SCAN AMB NON-IMAGING  Result Value Ref Range   Scan Result 20    Assessment & Plan:    1. Prostate cancer  Most recent PSA 0.63, low which is reassuring  Continue to monitor on 6 month basis with PSA only  and 1 year MD visit  2. Urinary frequency Improved Discontinued use of Flomax He is agreeable to this plan He will return sooner for symptoms worsen  3. Incomplete bladder emptying Adequate emptying of bladder  Personal history of mildly elevated post void residual Overall his urinary symptoms have improved We will recheck a PVR at his next  visit  Return in about 6 months (around 02/23/2020) for PSA only, 12 month PSA/ IPSS/ PVR.  [facility] [address] [place], [place] [address] [phone]  I, Lucas Mallow, am acting as a scribe for Dr. Hollice Espy,  I have reviewed the above documentation for accuracy and completeness, and I agree with the above.   Hollice Espy, MD

## 2019-08-23 ENCOUNTER — Encounter: Payer: Self-pay | Admitting: Urology

## 2019-08-23 ENCOUNTER — Other Ambulatory Visit: Payer: Self-pay

## 2019-08-23 ENCOUNTER — Ambulatory Visit: Payer: PPO | Admitting: Urology

## 2019-08-23 VITALS — BP 142/78 | HR 88 | Ht 67.0 in | Wt 164.0 lb

## 2019-08-23 DIAGNOSIS — C61 Malignant neoplasm of prostate: Secondary | ICD-10-CM

## 2019-08-23 DIAGNOSIS — R350 Frequency of micturition: Secondary | ICD-10-CM

## 2019-08-23 LAB — BLADDER SCAN AMB NON-IMAGING: Scan Result: 20

## 2019-08-24 ENCOUNTER — Ambulatory Visit: Payer: PPO | Admitting: Radiation Oncology

## 2019-08-31 ENCOUNTER — Ambulatory Visit
Admission: RE | Admit: 2019-08-31 | Discharge: 2019-08-31 | Disposition: A | Discharge status: Home / Self Care | Payer: PPO | Source: Ambulatory Visit | Attending: Radiation Oncology | Admitting: Radiation Oncology

## 2019-08-31 ENCOUNTER — Other Ambulatory Visit: Payer: Self-pay

## 2019-08-31 ENCOUNTER — Encounter: Payer: Self-pay | Admitting: Radiation Oncology

## 2019-08-31 VITALS — BP 142/80 | HR 75 | Temp 95.5°F | Resp 20 | Wt 163.4 lb

## 2019-08-31 DIAGNOSIS — C61 Malignant neoplasm of prostate: Secondary | ICD-10-CM | POA: Insufficient documentation

## 2019-08-31 DIAGNOSIS — Z923 Personal history of irradiation: Secondary | ICD-10-CM | POA: Insufficient documentation

## 2019-08-31 NOTE — Progress Notes (Signed)
Radiation Oncology Follow up Note  Name: Carl Allen   Date:   08/31/2019 MRN:  [id number] DOB: 06/29/1945    This 74 y.o. male presents to the clinic today for 69-month follow-up status post I-125 interstitial implant for Gleason 6 adenocarcinoma the prostate.  REFERRING PROVIDER: [name], MD  HPI: Patient is a 74 year old male now at 51 months having completed I-125 interstitial implant for Gleason 6 (3+3) adenocarcinoma the prostate presenting with a PSA of 5.  He is seen today in routine follow-up and is doing well specifically denies any increased lower urinary tract symptoms diarrhea or fatigue..  Patient's PSA continues a downward trend most recently 0.63 down from 1.98 over a year ago  COMPLICATIONS OF TREATMENT: none  FOLLOW UP COMPLIANCE: keeps appointments   PHYSICAL EXAM:  BP (!) 142/80   Pulse 75   Temp (!) 95.5 F (35.3 C) (Tympanic)   Resp 20   Wt 163 lb 6.4 oz (74.1 kg)   BMI 25.59 kg/m  Well-developed well-nourished patient in NAD. HEENT reveals PERLA, EOMI, discs not visualized.  Oral cavity is clear. No oral mucosal lesions are identified. Neck is clear without evidence of cervical or supraclavicular adenopathy. Lungs are clear to A&P. Cardiac examination is essentially unremarkable with regular rate and rhythm without murmur rub or thrill. Abdomen is benign with no organomegaly or masses noted. Motor sensory and DTR levels are equal and symmetric in the upper and lower extremities. Cranial nerves II through XII are grossly intact. Proprioception is intact. No peripheral adenopathy or edema is identified. No motor or sensory levels are noted. Crude visual fields are within normal range.  RADIOLOGY RESULTS: No current films to review  PLAN: Present time patient is doing well and biochemical control of his prostate cancer.  I am pleased with his overall progress.  Of asked to see him back in 1 year for follow-up.  Patient knows to call with any concerns at any  time.  I would like to take this opportunity to thank you for allowing me to participate in the care of your patient.Noreene Filbert, MD

## 2019-09-04 ENCOUNTER — Other Ambulatory Visit: Payer: Self-pay

## 2019-09-04 ENCOUNTER — Ambulatory Visit (INDEPENDENT_AMBULATORY_CARE_PROVIDER_SITE_OTHER): Payer: PPO | Admitting: Family Medicine

## 2019-09-04 ENCOUNTER — Encounter: Payer: Self-pay | Admitting: Family Medicine

## 2019-09-04 VITALS — BP 138/70 | HR 69 | Temp 97.6°F | Ht 66.54 in | Wt 162.4 lb

## 2019-09-04 DIAGNOSIS — C61 Malignant neoplasm of prostate: Secondary | ICD-10-CM

## 2019-09-04 DIAGNOSIS — J410 Simple chronic bronchitis: Secondary | ICD-10-CM

## 2019-09-04 DIAGNOSIS — Z8679 Personal history of other diseases of the circulatory system: Secondary | ICD-10-CM

## 2019-09-04 DIAGNOSIS — F1020 Alcohol dependence, uncomplicated: Secondary | ICD-10-CM

## 2019-09-04 DIAGNOSIS — E7800 Pure hypercholesterolemia, unspecified: Secondary | ICD-10-CM

## 2019-09-04 DIAGNOSIS — Z0000 Encounter for general adult medical examination without abnormal findings: Secondary | ICD-10-CM | POA: Diagnosis not present

## 2019-09-04 DIAGNOSIS — L03314 Cellulitis of groin: Secondary | ICD-10-CM | POA: Diagnosis not present

## 2019-09-04 DIAGNOSIS — L309 Dermatitis, unspecified: Secondary | ICD-10-CM | POA: Diagnosis not present

## 2019-09-04 DIAGNOSIS — I4891 Unspecified atrial fibrillation: Secondary | ICD-10-CM | POA: Diagnosis not present

## 2019-09-04 DIAGNOSIS — I10 Essential (primary) hypertension: Secondary | ICD-10-CM

## 2019-09-04 LAB — UA/M W/RFLX CULTURE, ROUTINE
Bilirubin, UA: NEGATIVE
Glucose, UA: NEGATIVE
Ketones, UA: NEGATIVE
Leukocytes,UA: NEGATIVE
Nitrite, UA: NEGATIVE
Protein,UA: NEGATIVE
RBC, UA: NEGATIVE
Specific Gravity, UA: 1.015 (ref 1.005–1.030)
Urobilinogen, Ur: 0.2 mg/dL (ref 0.2–1.0)
pH, UA: 6 (ref 5.0–7.5)

## 2019-09-04 LAB — MICROALBUMIN, URINE WAIVED
Creatinine, Urine Waived: 50 mg/dL (ref 10–300)
Microalb, Ur Waived: 10 mg/L (ref 0–19)
Microalb/Creat Ratio: <30 mg/g (ref <30)

## 2019-09-04 MED ORDER — HYDROCHLOROTHIAZIDE 25 MG PO TABS: 25.0000 mg | ORAL_TABLET | Freq: Every day | ORAL | 1 refills | Status: DC

## 2019-09-04 MED ORDER — SPIRIVA HANDIHALER 18 MCG IN CAPS: 18.0000 ug | ORAL_CAPSULE | Freq: Every morning | RESPIRATORY_TRACT | 1 refills | Status: DC

## 2019-09-04 MED ORDER — FENOFIBRATE MICRONIZED 134 MG PO CAPS: 134.0000 mg | ORAL_CAPSULE | Freq: Every day | ORAL | 1 refills | Status: DC

## 2019-09-04 MED ORDER — SULFAMETHOXAZOLE-TRIMETHOPRIM 800-160 MG PO TABS: 1.0000 | ORAL_TABLET | Freq: Two times a day (BID) | ORAL | 0 refills | Status: DC

## 2019-09-04 MED ORDER — AMLODIPINE BESYLATE 5 MG PO TABS: 5.0000 mg | ORAL_TABLET | Freq: Every day | ORAL | 1 refills | Status: DC

## 2019-09-04 MED ORDER — BENAZEPRIL HCL 40 MG PO TABS: 40.0000 mg | ORAL_TABLET | Freq: Every day | ORAL | 1 refills | Status: DC

## 2019-09-04 NOTE — Progress Notes (Signed)
BP 138/70 (BP Location: Left Arm, Cuff Size: Normal)   Pulse 69   Temp 97.6 F (36.4 C) (Oral)   Ht 5' 6.54" (1.69 m)   Wt 162 lb 6.4 oz (73.7 kg)   SpO2 95%   BMI 25.79 kg/m    Subjective:    Patient ID: Carl Allen, male    DOB: 1945-09-29, 74 y.o.   MRN: [id number]  HPI: Carl Allen is a 74 y.o. male presenting on 09/04/2019 for comprehensive medical examination. Current medical complaints include:  HYPERTENSION / HYPERLIPIDEMIA Satisfied with current treatment? yes Duration of hypertension: chronic BP monitoring frequency: not checking BP medication side effects: none Past BP meds: benazepril, amlodipine, HCTZ Duration of hyperlipidemia: chronic Cholesterol medication side effects: no Cholesterol supplements: none Past cholesterol medications: fenofibrate Medication compliance: excellent compliance Aspirin: no Recent stressors: no Recurrent headaches: no Visual changes: no Palpitations: no Dyspnea: no Chest pain: no Lower extremity edema: no Dizzy/lightheaded: no  COPD COPD status: controlled Satisfied with current treatment?: yes Oxygen use: no Dyspnea frequency: occasionally Cough frequency: occasionally Rescue inhaler frequency: occasionally   Limitation of activity: no Productive cough: no Pneumovax: Up to Date Influenza: Up to Date  SKIN INFECTION Duration: 1 day Location: R groin History of trauma in area: yes Pain: yes Quality: aching Severity: mildH3 Redness: yes Swelling: yes Oozing: no Pus: no Fevers: no Nausea/vomiting: no Status: stable Treatments attempted:neosporin  Tetanus: UTD  Interim Problems from his last visit: no  Depression Screen done today and results listed below:  Depression screen [facility] 2/9 09/04/2019 02/01/2019 01/26/2018 01/26/2018 12/13/2017  Decreased Interest 0 0 0 0 0  Down, Depressed, Hopeless 0 0 0 0 0  PHQ - 2 Score 0 0 0 0 0  Altered sleeping 1 - - - -  Tired, decreased energy 1 - - - -  Change in  appetite 0 - - - -  Feeling bad or failure about yourself  0 - - - -  Trouble concentrating 0 - - - -  Moving slowly or fidgety/restless 0 - - - -  Suicidal thoughts 0 - - - -  PHQ-9 Score 2 - - - -  Difficult doing work/chores Not difficult at all - - - -     Past Medical History:  Past Medical History:  Diagnosis Date  . Allergy   . Anxiety    not often  . Arthritis    fingers, knees  . Cancer ([place])   . Cataract   . Chronic alcoholism ([place])   . COPD (chronic obstructive pulmonary disease) ([place])   . Depression    pt states sometimes  . Dyspnea   . Emphysema of lung ([place])   . GERD (gastroesophageal reflux disease)    pt states every now and then  . Hypertension   . Hypertriglyceridemia     Surgical History:  Past Surgical History:  Procedure Laterality Date  . CATARACT EXTRACTION, BILATERAL  2014  . COLONOSCOPY  2012  . CYSTOSCOPY N/A 01/10/2018   Procedure: CYSTOSCOPY;  Surgeon: Hollice Espy, MD;  Location: [facility] ORS;  Service: Urology;  Laterality: N/A;  . RADIOACTIVE SEED IMPLANT N/A 01/10/2018   Procedure: RADIOACTIVE SEED IMPLANT/BRACHYTHERAPY IMPLANT;  Surgeon: Hollice Espy, MD;  Location: [facility] ORS;  Service: Urology;  Laterality: N/A;  . TONSILLECTOMY    . TONSILLECTOMY AND ADENOIDECTOMY  1952    Medications:  Current Outpatient Medications on File Prior to Visit  Medication Sig  . cetirizine (ZYRTEC) 10 MG tablet Take  10 mg by mouth daily as needed for allergies.  . ferrous sulfate 325 (65 FE) MG tablet Take 325 mg by mouth daily with breakfast.  . Multiple Vitamins-Minerals (CENTRUM SILVER 50+MEN) TABS Take 1 tablet by mouth daily.   No current facility-administered medications on file prior to visit.    Allergies:  No Known Allergies  Social History:  Social History   Socioeconomic History  . Marital status: Single    Spouse name: Not on file  . Number of children: Not on file  . Years of education: college  . Highest education level:  Bachelor's degree (e.g., BA, AB, BS)  Occupational History    Comment: Retired  Tobacco Use  . Smoking status: Current Every Day Smoker    Packs/day: 1.00  . Smokeless tobacco: Never Used  Substance and Sexual Activity  . Alcohol use: Yes    Alcohol/week: 3.0 standard drinks    Types: 3 Shots of liquor per week    Comment: pt is currently trying to quit  . Drug use: No  . Sexual activity: Not Currently  Other Topics Concern  . Not on file  Social History Narrative  . Not on file   Social Determinants of Health   Financial Resource Strain:   . Difficulty of Paying Living Expenses:   Food Insecurity:   . Worried About Charity fundraiser in the Last Year:   . Arboriculturist in the Last Year:   Transportation Needs:   . Film/video editor (Medical):   [name] Lack of Transportation (Non-Medical):   Physical Activity:   . Days of Exercise per Week:   . Minutes of Exercise per Session:   Stress:   . Feeling of Stress :   Social Connections:   . Frequency of Communication with Friends and Family:   . Frequency of Social Gatherings with Friends and Family:   . Attends Religious Services:   . Active Member of Clubs or Organizations:   . Attends Archivist Meetings:   [name] Marital Status:   Intimate Partner Violence:   . Fear of Current or Ex-Partner:   . Emotionally Abused:   [name] Physically Abused:   . Sexually Abused:    Social History   Tobacco Use  Smoking Status Current Every Day Smoker  . Packs/day: 1.00  Smokeless Tobacco Never Used   Social History   Substance and Sexual Activity  Alcohol Use Yes  . Alcohol/week: 3.0 standard drinks  . Types: 3 Shots of liquor per week   Comment: pt is currently trying to quit    Family History:  Family History  Problem Relation Age of Onset  . Arthritis Mother   . Heart disease Mother   . Hearing loss Mother   . Hyperlipidemia Mother   . Hypertension Mother   . Kidney disease Mother   . Miscarriages /  [place] Mother   . Vision loss Mother   . Stroke Father   . Heart disease Maternal Grandmother   . Stroke Maternal Grandfather   . Cancer Paternal Grandfather     Past medical history, surgical history, medications, allergies, family history and social history reviewed with patient today and changes made to appropriate areas of the chart.   Review of Systems  Constitutional: Negative.   HENT: Negative.   Eyes: Negative.   Respiratory: Positive for cough (smoker's cough) and shortness of breath. Negative for hemoptysis, sputum production and wheezing.   Cardiovascular: Negative.   Gastrointestinal: Positive for  heartburn. Negative for abdominal pain, blood in stool, constipation, diarrhea, melena, nausea and vomiting.  Genitourinary: Negative.   Musculoskeletal: Negative.   Skin: Positive for rash. Negative for itching.  Neurological: Negative.   Endo/Heme/Allergies: Positive for environmental allergies. Negative for polydipsia. Does not bruise/bleed easily.  Psychiatric/Behavioral: Negative.     All other ROS negative except what is listed above and in the HPI.      Objective:    BP 138/70 (BP Location: Left Arm, Cuff Size: Normal)   Pulse 69   Temp 97.6 F (36.4 C) (Oral)   Ht 5' 6.54" (1.69 m)   Wt 162 lb 6.4 oz (73.7 kg)   SpO2 95%   BMI 25.79 kg/m   Wt Readings from Last 3 Encounters:  09/04/19 162 lb 6.4 oz (73.7 kg)  08/31/19 163 lb 6.4 oz (74.1 kg)  08/23/19 164 lb (74.4 kg)    Physical Exam Vitals and nursing note reviewed.  Constitutional:      General: He is not in acute distress.    Appearance: Normal appearance. He is normal weight. He is not ill-appearing, toxic-appearing or diaphoretic.  HENT:     Head: Normocephalic and atraumatic.     Right Ear: Tympanic membrane, ear canal and external ear normal. There is no impacted cerumen.     Left Ear: Tympanic membrane, ear canal and external ear normal. There is no impacted cerumen.     Nose: Nose  normal. No congestion or rhinorrhea.     Mouth/Throat:     Mouth: Mucous membranes are moist.     Pharynx: Oropharynx is clear. No oropharyngeal exudate or posterior oropharyngeal erythema.  Eyes:     General: No scleral icterus.       Right eye: No discharge.        Left eye: No discharge.     Extraocular Movements: Extraocular movements intact.     Conjunctiva/sclera: Conjunctivae normal.     Pupils: Pupils are equal, round, and reactive to light.  Neck:     Vascular: No carotid bruit.  Cardiovascular:     Rate and Rhythm: Normal rate and regular rhythm.     Pulses: Normal pulses.     Heart sounds: No murmur. No friction rub. No gallop.   Pulmonary:     Effort: Pulmonary effort is normal. No respiratory distress.     Breath sounds: Normal breath sounds. No stridor. No wheezing, rhonchi or rales.  Chest:     Chest wall: No tenderness.  Abdominal:     General: Abdomen is flat. Bowel sounds are normal. There is no distension.     Palpations: Abdomen is soft. There is no mass.     Tenderness: There is no abdominal tenderness. There is no right CVA tenderness, left CVA tenderness, guarding or rebound.     Hernia: No hernia is present.  Genitourinary:    Comments: Genital exam deferred with shared decision making Musculoskeletal:        General: No swelling, tenderness, deformity or signs of injury.     Cervical back: Normal range of motion and neck supple. No rigidity. No muscular tenderness.     Right lower leg: No edema.     Left lower leg: No edema.  Lymphadenopathy:     Cervical: No cervical adenopathy.  Skin:    General: Skin is warm and dry.     Capillary Refill: Capillary refill takes less than 2 seconds.     Coloration: Skin is not jaundiced or pale.  Findings: No bruising, erythema, lesion or rash.     Comments: Erythematous, swollen are in R groin  Neurological:     General: No focal deficit present.     Mental Status: He is alert and oriented to person, place,  and time.     Cranial Nerves: No cranial nerve deficit.     Sensory: No sensory deficit.     Motor: No weakness.     Coordination: Coordination normal.     Gait: Gait normal.     Deep Tendon Reflexes: Reflexes normal.  Psychiatric:        Mood and Affect: Mood normal.        Behavior: Behavior normal.        Thought Content: Thought content normal.        Judgment: Judgment normal.     Results for orders placed or performed in visit on 08/23/19  BLADDER SCAN AMB NON-IMAGING  Result Value Ref Range   Scan Result 20       Assessment & Plan:   Problem List Items Addressed This Visit      Cardiovascular and Mediastinum   Essential hypertension    Under good control on current regimen. Continue current regimen. Continue to monitor. Call with any concerns. Refills given. Las drawn today.       Relevant Medications   amLODipine (NORVASC) 5 MG tablet   benazepril (LOTENSIN) 40 MG tablet   hydrochlorothiazide (HYDRODIURIL) 25 MG tablet   fenofibrate micronized (LOFIBRA) 134 MG capsule   Other Relevant Orders   CBC with Differential/Platelet   Comprehensive metabolic panel   Microalbumin, Urine Waived   TSH     Respiratory   COPD (chronic obstructive pulmonary disease) (HCC)    Under good control on current regimen. Continue current regimen. Continue to monitor. Call with any concerns. Refills given. Las drawn today.      Relevant Medications   tiotropium (SPIRIVA HANDIHALER) 18 MCG inhalation capsule   Other Relevant Orders   CBC with Differential/Platelet   Comprehensive metabolic panel   TSH     Musculoskeletal and Integument   Eczema    Stable. Continue to monitor. Call with any concerns.         Genitourinary   Prostate cancer [facility])    Follows with urology. Continue to monitor. Call with any concerns.       Relevant Medications   sulfamethoxazole-trimethoprim (BACTRIM DS) 800-160 MG tablet   Other Relevant Orders   CBC with Differential/Platelet    Comprehensive metabolic panel   TSH   UA/M w/rflx Culture, Routine     Other   Hypercholesteremia    Under good control on current regimen. Continue current regimen. Continue to monitor. Call with any concerns. Refills given. Las drawn today.      Relevant Medications   amLODipine (NORVASC) 5 MG tablet   benazepril (LOTENSIN) 40 MG tablet   hydrochlorothiazide (HYDRODIURIL) 25 MG tablet   fenofibrate micronized (LOFIBRA) 134 MG capsule   Other Relevant Orders   CBC with Differential/Platelet   Comprehensive metabolic panel   Lipid Panel w/o Chol/HDL Ratio   TSH   Chronic alcoholism ([place])    Has cut down significantly. Continue to monitor. Advised abstinence.       History of atrial fibrillation    Had an episode in 2015, no issues since then. Has had no issues since then. Continue to monitor.        Other Visit Diagnoses    Routine general medical examination  at a health care facility    -  Primary   Vaccines up to date. Screening labs checked today. Colonoscopy up to date. Continue diet and exercise. Call with any concerns.    Cellulitis of groin       Will treat with bactrim. Call with any concerns.        Discussed aspirin prophylaxis for myocardial infarction prevention and decision was made to continue ASA  LABORATORY TESTING:  Health maintenance labs ordered today as discussed above.   The natural history of prostate cancer and ongoing controversy regarding screening and potential treatment outcomes of prostate cancer has been discussed with the patient. The meaning of a false positive PSA and a false negative PSA has been discussed. He indicates understanding of the limitations of this screening test and wishes not to proceed with screening PSA testing as he had it done 2 weeks ago   IMMUNIZATIONS:   - Tdap: Tetanus vaccination status reviewed: last tetanus booster within 10 years. - Influenza: Up to date - Pneumovax: Up to date - Prevnar: Up to date - Zostavax  vaccine: Up to date  SCREENING: - Colonoscopy: Up to date  Discussed with patient purpose of the colonoscopy is to detect colon cancer at curable precancerous or early stages   PATIENT COUNSELING:    Sexuality: Discussed sexually transmitted diseases, partner selection, use of condoms, avoidance of unintended pregnancy  and contraceptive alternatives.   Advised to avoid cigarette smoking.  I discussed with the patient that most people either abstain from alcohol or drink within safe limits (<=14/week and <=4 drinks/occasion for males, <=7/weeks and <= 3 drinks/occasion for females) and that the risk for alcohol disorders and other health effects rises proportionally with the number of drinks per week and how often a drinker exceeds daily limits.  Discussed cessation/primary prevention of drug use and availability of treatment for abuse.   Diet: Encouraged to adjust caloric intake to maintain  or achieve ideal body weight, to reduce intake of dietary saturated fat and total fat, to limit sodium intake by avoiding high sodium foods and not adding table salt, and to maintain adequate dietary potassium and calcium preferably from fresh fruits, vegetables, and low-fat dairy products.    stressed the importance of regular exercise  Injury prevention: Discussed safety belts, safety helmets, smoke detector, smoking near bedding or upholstery.   Dental health: Discussed importance of regular tooth brushing, flossing, and dental visits.   Follow up plan: NEXT PREVENTATIVE PHYSICAL DUE IN 1 YEAR. Return in about 6 months (around 03/05/2020).

## 2019-09-04 NOTE — Patient Instructions (Signed)
Health Maintenance After Age 74 After age 74, you are at a higher risk for certain long-term diseases and infections as well as injuries from falls. Falls are a major cause of broken bones and head injuries in people who are older than age 74. Getting regular preventive care can help to keep you healthy and well. Preventive care includes getting regular testing and making lifestyle changes as recommended by your health care provider. Talk with your health care provider about:  Which screenings and tests you should have. A screening is a test that checks for a disease when you have no symptoms.  A diet and exercise plan that is right for you. What should I know about screenings and tests to prevent falls? Screening and testing are the best ways to find a health problem early. Early diagnosis and treatment give you the best chance of managing medical conditions that are common after age 74. Certain conditions and lifestyle choices may make you more likely to have a fall. Your health care provider may recommend:  Regular vision checks. Poor vision and conditions such as cataracts can make you more likely to have a fall. If you wear glasses, make sure to get your prescription updated if your vision changes.  Medicine review. Work with your health care provider to regularly review all of the medicines you are taking, including over-the-counter medicines. Ask your health care provider about any side effects that may make you more likely to have a fall. Tell your health care provider if any medicines that you take make you feel dizzy or sleepy.  Osteoporosis screening. Osteoporosis is a condition that causes the bones to get weaker. This can make the bones weak and cause them to break more easily.  Blood pressure screening. Blood pressure changes and medicines to control blood pressure can make you feel dizzy.  Strength and balance checks. Your health care provider may recommend certain tests to check your  strength and balance while standing, walking, or changing positions.  Foot health exam. Foot pain and numbness, as well as not wearing proper footwear, can make you more likely to have a fall.  Depression screening. You may be more likely to have a fall if you have a fear of falling, feel emotionally low, or feel unable to do activities that you used to do.  Alcohol use screening. Using too much alcohol can affect your balance and may make you more likely to have a fall. What actions can I take to lower my risk of falls? General instructions  Talk with your health care provider about your risks for falling. Tell your health care provider if: ? You fall. Be sure to tell your health care provider about all falls, even ones that seem minor. ? You feel dizzy, sleepy, or off-balance.  Take over-the-counter and prescription medicines only as told by your health care provider. These include any supplements.  Eat a healthy diet and maintain a healthy weight. A healthy diet includes low-fat dairy products, low-fat (lean) meats, and fiber from whole grains, beans, and lots of fruits and vegetables. Home safety  Remove any tripping hazards, such as rugs, cords, and clutter.  Install safety equipment such as grab bars in bathrooms and safety rails on stairs.  Keep rooms and walkways well-lit. Activity   Follow a regular exercise program to stay fit. This will help you maintain your balance. Ask your health care provider what types of exercise are appropriate for you.  If you need a cane or   walker, use it as recommended by your health care provider.  Wear supportive shoes that have nonskid soles. Lifestyle  Do not drink alcohol if your health care provider tells you not to drink.  If you drink alcohol, limit how much you have: ? 0-1 drink a day for women. ? 0-2 drinks a day for men.  Be aware of how much alcohol is in your drink. In the [place] one drink equals one typical bottle of beer (12  oz), one-half glass of wine (5 oz), or one shot of hard liquor (1 oz).  Do not use any products that contain nicotine or tobacco, such as cigarettes and e-cigarettes. If you need help quitting, ask your health care provider. Summary  Having a healthy lifestyle and getting preventive care can help to protect your health and wellness after age 74.  Screening and testing are the best way to find a health problem early and help you avoid having a fall. Early diagnosis and treatment give you the best chance for managing medical conditions that are more common for people who are older than age 74.  Falls are a major cause of broken bones and head injuries in people who are older than age 74. Take precautions to prevent a fall at home.  Work with your health care provider to learn what changes you can make to improve your health and wellness and to prevent falls. This information is not intended to replace advice given to you by your health care provider. Make sure you discuss any questions you have with your health care provider. Document Revised: 09/08/2018 Document Reviewed: 03/31/2017 Elsevier Patient Education  2020 [employer]vier Inc[employer]  

## 2019-09-04 NOTE — Assessment & Plan Note (Signed)
Under good control on current regimen. Continue current regimen. Continue to monitor. Call with any concerns. Refills given. Las drawn today.   

## 2019-09-04 NOTE — Assessment & Plan Note (Signed)
Had an episode in 2015, no issues since then. Has had no issues since then. Continue to monitor.

## 2019-09-04 NOTE — Assessment & Plan Note (Signed)
Has cut down significantly. Continue to monitor. Advised abstinence.

## 2019-09-04 NOTE — Assessment & Plan Note (Signed)
Stable. Continue to monitor. Call with any concerns.  ?

## 2019-09-04 NOTE — Assessment & Plan Note (Signed)
Follows with urology. Continue to monitor. Call with any concerns.  ?

## 2019-09-05 ENCOUNTER — Encounter: Payer: Self-pay | Admitting: Family Medicine

## 2019-09-05 LAB — COMPREHENSIVE METABOLIC PANEL
ALT: 10 IU/L (ref 0–44)
AST: 12 IU/L (ref 0–40)
Albumin/Globulin Ratio: 1.7 (ref 1.2–2.2)
Albumin: 4.4 g/dL (ref 3.7–4.7)
Alkaline Phosphatase: 68 IU/L (ref 39–117)
BUN/Creatinine Ratio: 19 (ref 10–24)
BUN: 18 mg/dL (ref 8–27)
Bilirubin Total: 0.4 mg/dL (ref 0.0–1.2)
CO2: 23 mmol/L (ref 20–29)
Calcium: 10 mg/dL (ref 8.6–10.2)
Chloride: 105 mmol/L (ref 96–106)
Creatinine, Ser: 0.93 mg/dL (ref 0.76–1.27)
GFR calc Af Amer: 94 mL/min/{1.73_m2} (ref >59)
GFR calc non Af Amer: 81 mL/min/{1.73_m2} (ref >59)
Globulin, Total: 2.6 g/dL (ref 1.5–4.5)
Glucose: 98 mg/dL (ref 65–99)
Potassium: 4.3 mmol/L (ref 3.5–5.2)
Sodium: 144 mmol/L (ref 134–144)
Total Protein: 7 g/dL (ref 6.0–8.5)

## 2019-09-05 LAB — TSH: TSH: 2.37 u[IU]/mL (ref 0.450–4.500)

## 2019-09-05 LAB — CBC WITH DIFFERENTIAL/PLATELET
Basophils Absolute: 0.1 10*3/uL (ref 0.0–0.2)
Basos: 1 %
EOS (ABSOLUTE): 0.1 10*3/uL (ref 0.0–0.4)
Eos: 2 %
Hematocrit: 42.6 % (ref 37.5–51.0)
Hemoglobin: 14.2 g/dL (ref 13.0–17.7)
Immature Grans (Abs): 0 10*3/uL (ref 0.0–0.1)
Immature Granulocytes: 1 %
Lymphocytes Absolute: 1.4 10*3/uL (ref 0.7–3.1)
Lymphs: 24 %
MCH: 31.6 pg (ref 26.6–33.0)
MCHC: 33.3 g/dL (ref 31.5–35.7)
MCV: 95 fL (ref 79–97)
Monocytes Absolute: 0.5 10*3/uL (ref 0.1–0.9)
Monocytes: 8 %
Neutrophils Absolute: 3.9 10*3/uL (ref 1.4–7.0)
Neutrophils: 64 %
Platelets: 179 10*3/uL (ref 150–450)
RBC: 4.5 x10E6/uL (ref 4.14–5.80)
RDW: 13.1 % (ref 11.6–15.4)
WBC: 6.1 10*3/uL (ref 3.4–10.8)

## 2019-09-05 LAB — LIPID PANEL W/O CHOL/HDL RATIO
Cholesterol, Total: 209 mg/dL — ABNORMAL HIGH (ref 100–199)
HDL: 33 mg/dL — ABNORMAL LOW (ref >39)
LDL Chol Calc (NIH): 123 mg/dL — ABNORMAL HIGH (ref 0–99)
Triglycerides: 297 mg/dL — ABNORMAL HIGH (ref 0–149)
VLDL Cholesterol Cal: 53 mg/dL — ABNORMAL HIGH (ref 5–40)

## 2019-09-12 ENCOUNTER — Ambulatory Visit (INDEPENDENT_AMBULATORY_CARE_PROVIDER_SITE_OTHER): Payer: PPO | Admitting: General Practice

## 2019-09-12 ENCOUNTER — Telehealth: Payer: PPO | Admitting: General Practice

## 2019-09-12 DIAGNOSIS — I4891 Unspecified atrial fibrillation: Secondary | ICD-10-CM

## 2019-09-12 DIAGNOSIS — F1020 Alcohol dependence, uncomplicated: Secondary | ICD-10-CM

## 2019-09-12 DIAGNOSIS — I10 Essential (primary) hypertension: Secondary | ICD-10-CM | POA: Diagnosis not present

## 2019-09-12 DIAGNOSIS — J410 Simple chronic bronchitis: Secondary | ICD-10-CM

## 2019-09-12 DIAGNOSIS — E7800 Pure hypercholesterolemia, unspecified: Secondary | ICD-10-CM | POA: Diagnosis not present

## 2019-09-12 DIAGNOSIS — F17200 Nicotine dependence, unspecified, uncomplicated: Secondary | ICD-10-CM

## 2019-09-12 NOTE — Patient Instructions (Signed)
Visit Information  Goals Addressed            This Visit's Progress   . RNCM: "I am working on getting my cholesterol levels down" (pt-stated)       CARE PLAN ENTRY (see longtitudinal plan of care for additional care plan information)  Current Barriers:  . Chronic Disease Management support, education, and care coordination needs related to Atrial Fibrillation, HTN, HLD, and COPD  Clinical Goal(s) related to Atrial Fibrillation, HTN, HLD, and COPD:  Over the next 120 days, patient will:  . Work with the care management team to address educational, disease management, and care coordination needs  . Begin or continue self health monitoring activities as directed today  adhere to a heart healthy diet . Call provider office for new or worsened signs and symptoms Blood pressure findings outside established parameters, Oxygen saturation lower than established parameter, Chest pain, Shortness of breath, and New or worsened symptom related to Afib/HLD and other chronic conditions . Call care management team with questions or concerns . Verbalize basic understanding of patient centered plan of care established today  Interventions related to Atrial Fibrillation, HTN, HLD, and COPD:  . Evaluation of current treatment plans and patient's adherence to plan as established by provider . Assessed patient understanding of disease states.  The patient has a good understanding of his chronic conditions. The patient is a good historian. Is compliant with medication regimen. Sees pcp as directed. . Assessed patient's education and care coordination needs.  The patient does need education and support related to how to reduce cholesterol levels and foods that are good in reducing cholesterol. The patient tries to eat heart healthy; however he eats a lot of "TV dinners" due to living by himself. The patient does not have Internet access but agrees to have information mailed to him.  . Provided disease specific  education to patient.  Education and support of alcohol use.  The patient has not drank in 2 weeks but admits he can not drink alone. Denies the need to talk with LCSW about alcohol use/abuse.  Feels he can stop drinking at any time. The patient also is a smoker. Smokes a pack per day. The patient says it is harder for him to put down cigarettes compared to alcohol. Empathetic listening. Information provided on resources available to help with alcohol abuse and smoking cessation. The patient verbalized understanding.  [name] with appropriate clinical care team members regarding patient needs.  The patient is aware of CCM team pharmacist and LCSW.    Patient Self Care Activities related to Atrial Fibrillation, HTN, HLD, and COPD:  . Patient is unable to independently self-manage chronic health conditions  Initial goal documentation        Mr. Casebolt was given information about Chronic Care Management services today including:  1. CCM service includes personalized support from designated clinical staff supervised by his physician, including individualized plan of care and coordination with other care providers 2. 24/7 contact phone numbers for assistance for urgent and routine care needs. 3. Service will only be billed when office clinical staff spend 20 minutes or more in a month to coordinate care. 4. Only one practitioner may furnish and bill the service in a calendar month. 5. The patient may stop CCM services at any time (effective at the end of the month) by phone call to the office staff. 6. The patient will be responsible for cost sharing (co-pay) of up to 20% of the service fee (after  annual deductible is met).  Patient agreed to services and verbal consent obtained.   Patient verbalizes understanding of instructions provided today.   Telephone follow up appointment with care management team member scheduled for: June 16 at 3 pm  [place], MSN, [facility] Mobile: [phone]   Cholesterol Content in Foods Cholesterol is a waxy, fat-like substance that helps to carry fat in the blood. The body needs cholesterol in small amounts, but too much cholesterol can cause damage to the arteries and heart. Most people should eat less than 200 milligrams (mg) of cholesterol a day. Foods with cholesterol  Cholesterol is found in animal-based foods, such as meat, seafood, and dairy. Generally, low-fat dairy and lean meats have less cholesterol than full-fat dairy and fatty meats. The milligrams of cholesterol per serving (mg per serving) of common cholesterol-containing foods are listed below. Meat and other proteins  Egg -- one large whole egg has 186 mg.  Veal shank -- 4 oz has 141 mg.  Lean ground [place] (93% lean) -- 4 oz has 118 mg.  Fat-trimmed lamb loin -- 4 oz has 106 mg.  Lean ground beef (90% lean) -- 4 oz has 100 mg.  Lobster -- 3.5 oz has 90 mg.  Pork loin chops -- 4 oz has 86 mg.  Canned salmon -- 3.5 oz has 83 mg.  Fat-trimmed beef top loin -- 4 oz has 78 mg.  Frankfurter -- 1 frank (3.5 oz) has 77 mg.  Crab -- 3.5 oz has 71 mg.  Roasted chicken without skin, white meat -- 4 oz has 66 mg.  Light bologna -- 2 oz has 45 mg.  Deli-cut [place] -- 2 oz has 31 mg.  Canned tuna -- 3.5 oz has 31 mg.  Berniece Salines -- 1 oz has 29 mg.  Oysters and mussels (raw) -- 3.5 oz has 25 mg.  Mackerel -- 1 oz has 22 mg.  Trout -- 1 oz has 20 mg.  Pork sausage -- 1 link (1 oz) has 17 mg.  Salmon -- 1 oz has 16 mg.  Tilapia -- 1 oz has 14 mg. Dairy  Soft-serve ice cream --  cup (4 oz) has 103 mg.  Whole-milk yogurt -- 1 cup (8 oz) has 29 mg.  Cheddar cheese -- 1 oz has 28 mg.  American cheese -- 1 oz has 28 mg.  Whole milk -- 1 cup (8 oz) has 23 mg.  2% milk -- 1 cup (8 oz) has 18 mg.  Cream cheese -- 1 tablespoon (Tbsp) has 15 mg.  Cottage cheese  --  cup (4 oz) has 14 mg.  Low-fat (1%) milk -- 1 cup (8 oz) has 10 mg.  Sour cream -- 1 Tbsp has 8.5 mg.  Low-fat yogurt -- 1 cup (8 oz) has 8 mg.  Nonfat Greek yogurt -- 1 cup (8 oz) has 7 mg.  Half-and-half cream -- 1 Tbsp has 5 mg. Fats and oils  Cod liver oil -- 1 tablespoon (Tbsp) has 82 mg.  Butter -- 1 Tbsp has 15 mg.  Lard -- 1 Tbsp has 14 mg.  Bacon grease -- 1 Tbsp has 14 mg.  Mayonnaise -- 1 Tbsp has 5-10 mg.  Margarine -- 1 Tbsp has 3-10 mg. Exact amounts of cholesterol in these foods may vary depending on specific ingredients and brands. Foods without cholesterol Most plant-based foods do not have cholesterol unless you combine them with a  food that has cholesterol. Foods without cholesterol include:  Grains and cereals.  Vegetables.  Fruits.  Vegetable oils, such as olive, canola, and sunflower oil.  Legumes, such as peas, beans, and lentils.  Nuts and seeds.  Egg whites. Summary  The body needs cholesterol in small amounts, but too much cholesterol can cause damage to the arteries and heart.  Most people should eat less than 200 milligrams (mg) of cholesterol a day. This information is not intended to replace advice given to you by your health care provider. Make sure you discuss any questions you have with your health care provider. Document Revised: 04/30/2017 Document Reviewed: 01/12/2017 Elsevier Patient Education  [place] and Cholesterol Restricted Eating Plan Getting too much fat and cholesterol in your diet may cause health problems. Choosing the right foods helps keep your fat and cholesterol at normal levels. This can keep you from getting certain diseases. Your doctor may recommend an eating plan that includes: Total fat: ______% or less of total calories a day. Saturated fat: ______% or less of total calories a day. Cholesterol: less than _________mg a day. Fiber: ______g a day. What are tips for following this  plan? Meal planning At meals, divide your plate into four equal parts: Fill one-half of your plate with vegetables and green salads. Fill one-fourth of your plate with whole grains. Fill one-fourth of your plate with low-fat (lean) protein foods. Eat fish that is high in omega-3 fats at least two times a week. This includes mackerel, tuna, sardines, and salmon. Eat foods that are high in fiber, such as whole grains, beans, apples, broccoli, carrots, peas, and barley. General tips  Work with your doctor to lose weight if you need to. Avoid: Foods with added sugar. Fried foods. Foods with partially hydrogenated oils. Limit alcohol intake to no more than 1 drink a day for nonpregnant women and 2 drinks a day for men. One drink equals 12 oz of beer, 5 oz of wine, or 1 oz of hard liquor. Reading food labels Check food labels for: Trans fats. Partially hydrogenated oils. Saturated fat (g) in each serving. Cholesterol (mg) in each serving. Fiber (g) in each serving. Choose foods with healthy fats, such as: Monounsaturated fats. Polyunsaturated fats. Omega-3 fats. Choose grain products that have whole grains. Look for the word "whole" as the first word in the ingredient list. Cooking Cook foods using low-fat methods. These include baking, boiling, grilling, and broiling. Eat more home-cooked foods. Eat at restaurants and buffets less often. Avoid cooking using saturated fats, such as butter, cream, palm oil, palm kernel oil, and coconut oil. Recommended foods  Fruits All fresh, canned (in natural juice), or frozen fruits. Vegetables Fresh or frozen vegetables (raw, steamed, roasted, or grilled). Green salads. Grains Whole grains, such as whole wheat or whole grain breads, crackers, cereals, and pasta. Unsweetened oatmeal, bulgur, barley, quinoa, or brown rice. Corn or whole wheat flour tortillas. Meats and other protein foods Ground beef (85% or leaner), grass-fed beef, or beef  trimmed of fat. Skinless chicken or [place]. Ground chicken or [place]. Pork trimmed of fat. All fish and seafood. Egg whites. Dried beans, peas, or lentils. Unsalted nuts or seeds. Unsalted canned beans. Nut butters without added sugar or oil. Dairy Low-fat or nonfat dairy products, such as skim or 1% milk, 2% or reduced-fat cheeses, low-fat and fat-free ricotta or cottage cheese, or plain low-fat and nonfat yogurt. Fats and oils Tub margarine without trans fats. Light or reduced-fat mayonnaise  and salad dressings. Avocado. Olive, canola, sesame, or safflower oils. The items listed above may not be a complete list of foods and beverages you can eat. Contact a dietitian for more information. Foods to avoid Fruits Canned fruit in heavy syrup. Fruit in cream or butter sauce. Fried fruit. Vegetables Vegetables cooked in cheese, cream, or butter sauce. Fried vegetables. Grains White bread. White pasta. White rice. Cornbread. Bagels, pastries, and croissants. Crackers and snack foods that contain trans fat and hydrogenated oils. Meats and other protein foods Fatty cuts of meat. Ribs, chicken wings, bacon, sausage, bologna, salami, chitterlings, fatback, hot dogs, bratwurst, and packaged lunch meats. Liver and organ meats. Whole eggs and egg yolks. Chicken and [place] with skin. Fried meat. Dairy Whole or 2% milk, cream, half-and-half, and cream cheese. Whole milk cheeses. Whole-fat or sweetened yogurt. Full-fat cheeses. Nondairy creamers and whipped toppings. Processed cheese, cheese spreads, and cheese curds. Beverages Alcohol. Sugar-sweetened drinks such as sodas, lemonade, and fruit drinks. Fats and oils Butter, stick margarine, lard, shortening, ghee, or bacon fat. Coconut, palm kernel, and palm oils. Sweets and desserts Corn syrup, sugars, honey, and molasses. Candy. Jam and jelly. Syrup. Sweetened cereals. Cookies, pies, cakes, donuts, muffins, and ice cream. The items listed above may not be a  complete list of foods and beverages you should avoid. Contact a dietitian for more information. Summary Choosing the right foods helps keep your fat and cholesterol at normal levels. This can keep you from getting certain diseases. At meals, fill one-half of your plate with vegetables and green salads. Eat high-fiber foods, like whole grains, beans, apples, carrots, peas, and barley. Limit added sugar, saturated fats, alcohol, and fried foods. This information is not intended to replace advice given to you by your health care provider. Make sure you discuss any questions you have with your health care provider. Document Revised: 01/19/2018 Document Reviewed: 02/02/2017 Elsevier Patient Education  [place].

## 2019-09-12 NOTE — Chronic Care Management (AMB) (Signed)
Chronic Care Management   Initial Visit Note  09/12/2019 Name: Carl Allen MRN: [id number] DOB: 21-May-1946  Referred by: Carl Allen Reason for referral : Chronic Care Management (RNCM Chronic Disease Management and Care Coordination Needs)   Carl Allen is a 74 y.o. year old male who is a primary care patient of Carl Allen. The CCM team was consulted for assistance with chronic disease management and care coordination needs related to Atrial Fibrillation, HTN, HLD and COPD  Review of patient status, including review of consultants reports, relevant laboratory and other test results, and collaboration with appropriate care team members and the patient's provider was performed as part of comprehensive patient evaluation and provision of chronic care management services.    SDOH (Social Determinants of Health) assessments performed: Yes See Care Plan activities for detailed interventions related to SDOH  SDOH Interventions     Most Recent Value  SDOH Interventions  SDOH Interventions for the Following Domains  Alcohol Usage, Tobacco, Physical Activity  Physical Activity Interventions  Other (Comments) [Does not do any structured activity]  Tobacco Interventions  Other (Comment) [The patient has tried to quit before but has been unsuccessful]  Alcohol Brief Interventions/Follow-up  Patient Refused       Medications: Outpatient Encounter Medications as of 09/12/2019  Medication Sig  . amLODipine (NORVASC) 5 MG tablet Take 1 tablet (5 mg total) by mouth daily.  . benazepril (LOTENSIN) 40 MG tablet Take 1 tablet (40 mg total) by mouth daily.  . cetirizine (ZYRTEC) 10 MG tablet Take 10 mg by mouth daily as needed for allergies.  . fenofibrate micronized (LOFIBRA) 134 MG capsule Take 1 capsule (134 mg total) by mouth daily before breakfast.  . ferrous sulfate 325 (65 FE) MG tablet Take 325 mg by mouth daily with breakfast.  . hydrochlorothiazide (HYDRODIURIL) 25 MG  tablet Take 1 tablet (25 mg total) by mouth daily.  . Multiple Vitamins-Minerals (CENTRUM SILVER 50+MEN) TABS Take 1 tablet by mouth daily.  [name] sulfamethoxazole-trimethoprim (BACTRIM DS) 800-160 MG tablet Take 1 tablet by mouth 2 (two) times daily.  [name] tiotropium (SPIRIVA HANDIHALER) 18 MCG inhalation capsule Place 1 capsule (18 mcg total) into inhaler and inhale every morning.   No facility-administered encounter medications on file as of 09/12/2019.     Objective:  BP Readings from Last 3 Encounters:  09/04/19 138/70  08/31/19 (!) 142/80  08/23/19 (!) 142/78    Goals Addressed            This Visit's Progress   . RNCM: "I am working on getting my cholesterol levels down" (pt-stated)       CARE PLAN ENTRY (see longtitudinal plan of care for additional care plan information)  Current Barriers:  . Chronic Disease Management support, education, and care coordination needs related to Atrial Fibrillation, HTN, HLD, and COPD  Clinical Goal(s) related to Atrial Fibrillation, HTN, HLD, and COPD:  Over the next 120 days, patient will:  . Work with the care management team to address educational, disease management, and care coordination needs  . Begin or continue self health monitoring activities as directed today  adhere to a heart healthy diet . Call provider office for new or worsened signs and symptoms Blood pressure findings outside established parameters, Oxygen saturation lower than established parameter, Chest pain, Shortness of breath, and New or worsened symptom related to Afib/HLD and other chronic conditions . Call care management team with questions or concerns . Verbalize basic understanding of patient  centered plan of care established today  Interventions related to Atrial Fibrillation, HTN, HLD, and COPD:  . Evaluation of current treatment plans and patient's adherence to plan as established by provider . Assessed patient understanding of disease states.  The patient has a  good understanding of his chronic conditions. The patient is a good historian. Is compliant with medication regimen. Sees pcp as directed. . Assessed patient's education and care coordination needs.  The patient does need education and support related to how to reduce cholesterol levels and foods that are good in reducing cholesterol. The patient tries to eat heart healthy; however he eats a lot of "TV dinners" due to living by himself. The patient does not have Internet access but agrees to have information mailed to him.  . Provided disease specific education to patient.  Education and support of alcohol use.  The patient has not drank in 2 weeks but admits he can not drink alone. Denies the need to talk with LCSW about alcohol use/abuse.  Feels he can stop drinking at any time. The patient also is a smoker. Smokes a pack per day. The patient says it is harder for him to put down cigarettes compared to alcohol. Empathetic listening. Information provided on resources available to help with alcohol abuse and smoking cessation. The patient verbalized understanding.  . Collaborated with appropriate clinical care team members regarding patient needs.  The patient is aware of CCM team pharmacist and LCSW.    Patient Self Care Activities related to Atrial Fibrillation, HTN, HLD, and COPD:  . Patient is unable to independently self-manage chronic health conditions  Initial goal documentation         Carl Allen was given information about Chronic Care Management services today including:  1. CCM service includes personalized support from designated clinical staff supervised by his physician, including individualized plan of care and coordination with other care providers 2. 24/7 contact phone numbers for assistance for urgent and routine care needs. 3. Service will only be billed when office clinical staff spend 20 minutes or more in a month to coordinate care. 4. Only one practitioner may furnish and  bill the service in a calendar month. 5. The patient may stop CCM services at any time (effective at the end of the month) by phone call to the office staff. 6. The patient will be responsible for cost sharing (co-pay) of up to 20% of the service fee (after annual deductible is met).  Patient agreed to services and verbal consent obtained.   Plan:   Telephone follow up appointment with care management team member scheduled for: November 15, 2019 at 3 pm  Pam Tate RN, MSN, CCM Community Care Coordinator [place]  [facility] Mobile: [phone]    

## 2019-11-15 ENCOUNTER — Ambulatory Visit (INDEPENDENT_AMBULATORY_CARE_PROVIDER_SITE_OTHER): Payer: PPO | Admitting: General Practice

## 2019-11-15 ENCOUNTER — Telehealth: Payer: Self-pay | Admitting: General Practice

## 2019-11-15 DIAGNOSIS — I4891 Unspecified atrial fibrillation: Secondary | ICD-10-CM

## 2019-11-15 DIAGNOSIS — E7800 Pure hypercholesterolemia, unspecified: Secondary | ICD-10-CM

## 2019-11-15 DIAGNOSIS — I10 Essential (primary) hypertension: Secondary | ICD-10-CM

## 2019-11-15 DIAGNOSIS — F1020 Alcohol dependence, uncomplicated: Secondary | ICD-10-CM

## 2019-11-15 DIAGNOSIS — F17200 Nicotine dependence, unspecified, uncomplicated: Secondary | ICD-10-CM

## 2019-11-15 NOTE — Patient Instructions (Signed)
Visit Information  Goals Addressed              This Visit's Progress   .  RNCM: "I am working on getting my cholesterol levels down" (pt-stated)        CARE PLAN ENTRY (see longtitudinal plan of care for additional care plan information)  Current Barriers:  . Chronic Disease Management support, education, and care coordination needs related to Atrial Fibrillation, HTN, HLD, and COPD  Clinical Goal(s) related to Atrial Fibrillation, HTN, HLD, and COPD:  Over the next 120 days, patient will:  . Work with the care management team to address educational, disease management, and care coordination needs  . Begin or continue self health monitoring activities as directed today  adhere to a heart healthy diet . Call provider office for new or worsened signs and symptoms Blood pressure findings outside established parameters, Oxygen saturation lower than established parameter, Chest pain, Shortness of breath, and New or worsened symptom related to Afib/HLD and other chronic conditions . Call care management team with questions or concerns . Verbalize basic understanding of patient centered plan of care established today  Interventions related to Atrial Fibrillation, HTN, HLD, and COPD:  . Evaluation of current treatment plans and patient's adherence to plan as established by provider . Assessed patient understanding of disease states.  The patient has a good understanding of his chronic conditions. The patient is a good historian. Is compliant with medication regimen. Sees pcp as directed. . Assessed patient's education and care coordination needs.  The patient does need education and support related to how to reduce cholesterol levels and foods that are good in reducing cholesterol. The patient tries to eat heart healthy; however he eats a lot of "TV dinners" due to living by himself. The patient does not have Internet access but agrees to have information mailed to him. 11-15-2019:The patient has  made positive changes in his dietary habits.  He endorses losing a few pounds even. The patient states he is determined to get his cholesterol levels are down.  . Evaluation of patients blood pressure readings. The patient has a wrist monitor. He knows the wrist cuff is not as accurate but he feels this is better than not taking at all. The patients readings yesterday was 117/70 with a pulse of 67.  The patient states if it is "off" he will take the reading at least 3 times.  The patient has a good understanding of hypertension and hypotension.   . Provided disease specific education to patient.  Education and support of alcohol use.  The patient has not drank in 2 weeks but admits he can not drink alone. Denies the need to talk with LCSW about alcohol use/abuse.  Feels he can stop drinking at any time. The patient also is a smoker. Smokes a pack per day. The patient says it is harder for him to put down cigarettes compared to alcohol. Empathetic listening. Information provided on resources available to help with alcohol abuse and smoking cessation. The patient verbalized understanding. 11-15-2019: The patient verbalized he is making small changes. The patient verbalized that he tries not to have alcohol in the house. He got a DUI in 1995, therefore her does not drive when he is drinking and he also drinks less when he is in a crowd. The patient verbalized that he knows his limits.  . Evaluated the patients activity level. The patients favorite hobby is fishing. He use lures and likes to go bass and brim fishing.  Does a lot of casting.  [name] with appropriate clinical care team members regarding patient needs.  The patient is aware of CCM team pharmacist and LCSW.    Patient Self Care Activities related to Atrial Fibrillation, HTN, HLD, and COPD:  . Patient is unable to independently self-manage chronic health conditions  Please see past updates related to this goal by clicking on the "Past  Updates" button in the selected goal         Patient verbalizes understanding of instructions provided today.   The care management team will reach out to the patient again over the next 60 days.   Noreene Larsson RN, MSN, [facility] Mobile: [phone]

## 2019-11-15 NOTE — Chronic Care Management (AMB) (Signed)
Chronic Care Management   Follow Up Note   11/15/2019 Name: Carl Allen MRN: [id number] DOB: 1946-04-26  Referred by: [name], MD Reason for referral : Chronic Care Management (Follow-up: RNCM Chronic Disease Management and Care Coordination Needs)   Carl Allen is a 74 y.o. year old male who is a primary care patient of Crissman, Jeannette How, MD. The CCM team was consulted for assistance with chronic disease management and care coordination needs.    Review of patient status, including review of consultants reports, relevant laboratory and other test results, and collaboration with appropriate care team members and the patient's provider was performed as part of comprehensive patient evaluation and provision of chronic care management services.    SDOH (Social Determinants of Health) assessments performed: Yes- ETOH , Smoking, and Activity level See Care Plan activities for detailed interventions related to [facility])     Outpatient Encounter Medications as of 11/15/2019  Medication Sig   amLODipine (NORVASC) 5 MG tablet Take 1 tablet (5 mg total) by mouth daily.   benazepril (LOTENSIN) 40 MG tablet Take 1 tablet (40 mg total) by mouth daily.   cetirizine (ZYRTEC) 10 MG tablet Take 10 mg by mouth daily as needed for allergies.   fenofibrate micronized (LOFIBRA) 134 MG capsule Take 1 capsule (134 mg total) by mouth daily before breakfast.   ferrous sulfate 325 (65 FE) MG tablet Take 325 mg by mouth daily with breakfast.   hydrochlorothiazide (HYDRODIURIL) 25 MG tablet Take 1 tablet (25 mg total) by mouth daily.   Multiple Vitamins-Minerals (CENTRUM SILVER 50+MEN) TABS Take 1 tablet by mouth daily.   sulfamethoxazole-trimethoprim (BACTRIM DS) 800-160 MG tablet Take 1 tablet by mouth 2 (two) times daily.   tiotropium (SPIRIVA HANDIHALER) 18 MCG inhalation capsule Place 1 capsule (18 mcg total) into inhaler and inhale every morning.   No facility-administered encounter  medications on file as of 11/15/2019.     Objective:   Goals Addressed              This Visit's Progress     RNCM: "I am working on getting my cholesterol levels down" (pt-stated)        [place] (see longtitudinal plan of care for additional care plan information)  Current Barriers:   Chronic Disease Management support, education, and care coordination needs related to Atrial Fibrillation, HTN, HLD, and COPD  Clinical Goal(s) related to Atrial Fibrillation, HTN, HLD, and COPD:  Over the next 120 days, patient will:   Work with the care management team to address educational, disease management, and care coordination needs   Begin or continue self health monitoring activities as directed today  adhere to a heart healthy diet  Call provider office for new or worsened signs and symptoms Blood pressure findings outside established parameters, Oxygen saturation lower than established parameter, Chest pain, Shortness of breath, and New or worsened symptom related to Afib/HLD and other chronic conditions  Call care management team with questions or concerns  Verbalize basic understanding of patient centered plan of care established today  Interventions related to Atrial Fibrillation, HTN, HLD, and COPD:   Evaluation of current treatment plans and patient's adherence to plan as established by provider  Assessed patient understanding of disease states.  The patient has a good understanding of his chronic conditions. The patient is a good historian. Is compliant with medication regimen. Sees pcp as directed.  Assessed patient's education and care coordination needs.  The patient does need education  and support related to how to reduce cholesterol levels and foods that are good in reducing cholesterol. The patient tries to eat heart healthy; however he eats a lot of "TV dinners" due to living by himself. The patient does not have Internet access but agrees to have information  mailed to him. 11-15-2019:The patient has made positive changes in his dietary habits.  He endorses losing a few pounds even. The patient states he is determined to get his cholesterol levels are down.   Evaluation of patients blood pressure readings. The patient has a wrist monitor. He knows the wrist cuff is not as accurate but he feels this is better than not taking at all. The patients readings yesterday was 117/70 with a pulse of 67.  The patient states if it is "off" he will take the reading at least 3 times.  The patient has a good understanding of hypertension and hypotension.    Provided disease specific education to patient.  Education and support of alcohol use.  The patient has not drank in 2 weeks but admits he can not drink alone. Denies the need to talk with LCSW about alcohol use/abuse.  Feels he can stop drinking at any time. The patient also is a smoker. Smokes a pack per day. The patient says it is harder for him to put down cigarettes compared to alcohol. Empathetic listening. Information provided on resources available to help with alcohol abuse and smoking cessation. The patient verbalized understanding. 11-15-2019: The patient verbalized he is making small changes. The patient verbalized that he tries not to have alcohol in the house. He got a DUI in 1995, therefore her does not drive when he is drinking and he also drinks less when he is in a crowd. The patient verbalized that he knows his limits.   Evaluated the patients activity level. The patients favorite hobby is fishing. He use lures and likes to go bass and brim fishing.  Does a lot of casting.   Collaborated with appropriate clinical care team members regarding patient needs.  The patient is aware of CCM team pharmacist and LCSW.    Patient Self Care Activities related to Atrial Fibrillation, HTN, HLD, and COPD:   Patient is unable to independently self-manage chronic health conditions  Please see past updates related to  this goal by clicking on the "Past Updates" button in the selected goal          Plan:   The care management team will reach out to the patient again over the next 60 days.    Noreene Larsson RN, MSN, [facility] Mobile: [phone]

## 2020-01-03 ENCOUNTER — Telehealth: Payer: Self-pay

## 2020-01-03 ENCOUNTER — Telehealth: Payer: Self-pay | Admitting: General Practice

## 2020-01-03 NOTE — Telephone Encounter (Signed)
  Chronic Care Management   Outreach Note  01/03/2020 Name: Carl Allen MRN: [id number] DOB: [birth year]  Referred by: [name], MD Reason for referral : Appointment (RNCM Follow up appointment for Chronic Disease Management and Care Coordination Needs)   An unsuccessful telephone outreach was attempted today. The patient was referred to the case management team for assistance with care management and care coordination.   Follow Up Plan: A HIPPA compliant phone message was left for the patient providing contact information and requesting a return call.   Noreene Larsson RN, MSN, [facility] Mobile: [phone]

## 2020-01-15 NOTE — Telephone Encounter (Signed)
Pt has been r/s for 02/06/2020 

## 2020-02-06 ENCOUNTER — Ambulatory Visit: Payer: Self-pay | Admitting: General Practice

## 2020-02-06 ENCOUNTER — Telehealth: Payer: PPO | Admitting: General Practice

## 2020-02-06 DIAGNOSIS — I4891 Unspecified atrial fibrillation: Secondary | ICD-10-CM

## 2020-02-06 DIAGNOSIS — J410 Simple chronic bronchitis: Secondary | ICD-10-CM

## 2020-02-06 DIAGNOSIS — I10 Essential (primary) hypertension: Secondary | ICD-10-CM

## 2020-02-06 DIAGNOSIS — F1020 Alcohol dependence, uncomplicated: Secondary | ICD-10-CM

## 2020-02-06 DIAGNOSIS — E7800 Pure hypercholesterolemia, unspecified: Secondary | ICD-10-CM

## 2020-02-06 NOTE — Chronic Care Management (AMB) (Signed)
Chronic Care Management   Follow Up Note   02/06/2020 Name: [name] MRN: [id number] DOB: [birth year]  Referred by: [name], MD Reason for referral : Chronic Care Management (RNCM follow up for Chronic Disease Management and Care Coordination Needs)   Carl Allen is a 74 y.o. year old male who is a primary care patient of Crissman, Jeannette How, MD. The CCM team was consulted for assistance with chronic disease management and care coordination needs.    Review of patient status, including review of consultants reports, relevant laboratory and other test results, and collaboration with appropriate care team members and the patient's provider was performed as part of comprehensive patient evaluation and provision of chronic care management services.    SDOH (Social Determinants of Health) assessments performed: Yes See Care Plan activities for detailed interventions related to [facility])     Outpatient Encounter Medications as of 02/06/2020  Medication Sig  . amLODipine (NORVASC) 5 MG tablet Take 1 tablet (5 mg total) by mouth daily.  . benazepril (LOTENSIN) 40 MG tablet Take 1 tablet (40 mg total) by mouth daily.  . cetirizine (ZYRTEC) 10 MG tablet Take 10 mg by mouth daily as needed for allergies.  . fenofibrate micronized (LOFIBRA) 134 MG capsule Take 1 capsule (134 mg total) by mouth daily before breakfast.  . ferrous sulfate 325 (65 FE) MG tablet Take 325 mg by mouth daily with breakfast.  . hydrochlorothiazide (HYDRODIURIL) 25 MG tablet Take 1 tablet (25 mg total) by mouth daily.  . Multiple Vitamins-Minerals (CENTRUM SILVER 50+MEN) TABS Take 1 tablet by mouth daily.  [name] sulfamethoxazole-trimethoprim (BACTRIM DS) 800-160 MG tablet Take 1 tablet by mouth 2 (two) times daily.  [name] tiotropium (SPIRIVA HANDIHALER) 18 MCG inhalation capsule Place 1 capsule (18 mcg total) into inhaler and inhale every morning.   No facility-administered encounter medications on file as of 02/06/2020.       Objective:  BP Readings from Last 3 Encounters:  09/04/19 138/70  08/31/19 (!) 142/80  08/23/19 (!) 142/78    Goals Addressed              This Visit's Progress   .  RNCM: "I am working on getting my cholesterol levels down" (pt-stated)        CARE PLAN ENTRY (see longtitudinal plan of care for additional care plan information)  Current Barriers:  . Chronic Disease Management support, education, and care coordination needs related to Atrial Fibrillation, HTN, HLD, and COPD  Clinical Goal(s) related to Atrial Fibrillation, HTN, HLD, and COPD:  Over the next 120 days, patient will:  . Work with the care management team to address educational, disease management, and care coordination needs  . Begin or continue self health monitoring activities as directed today  adhere to a heart healthy diet . Call provider office for new or worsened signs and symptoms Blood pressure findings outside established parameters, Oxygen saturation lower than established parameter, Chest pain, Shortness of breath, and New or worsened symptom related to Afib/HLD and other chronic conditions . Call care management team with questions or concerns . Verbalize basic understanding of patient centered plan of care established today  Interventions related to Atrial Fibrillation, HTN, HLD, and COPD:  . Evaluation of current treatment plans and patient's adherence to plan as established by provider.  02-06-2020: The patient states he is doing well. He is compliant with his medications regimen and denies any acute changes. The patient reports he has not had any alcohol in  2.5 to 3 weeks. The patient also reports that his PSA level is remaining good. He has been having it checked regularly since seeds were planted several years ago.  . Assessed patient understanding of disease states.  The patient has a good understanding of his chronic conditions. The patient is a good historian. Is compliant with medication regimen.  Sees pcp as directed. . Assessed patient's education and care coordination needs.  The patient does need education and support related to Allen to reduce cholesterol levels and foods that are good in reducing cholesterol. The patient tries to eat heart healthy; however he eats a lot of "TV dinners" due to living by himself. The patient does not have Internet access but agrees to have information mailed to him. 11-15-2019:The patient has made positive changes in his dietary habits.  He endorses losing a few pounds even. The patient states he is determined to get his cholesterol levels are down. 02-06-2020: The patient continues to monitor his food intake and watching what he eats. Endorses eating heart healthy diet.  . Evaluation of patients blood pressure readings. The patient has a wrist monitor. He knows the wrist cuff is not as accurate but he feels this is better than not taking at all. The patients readings yesterday was 117/70 with a pulse of 67.  The patient states if it is "off" he will take the reading at least 3 times.  The patient has a good understanding of hypertension and hypotension.  02-06-2020: The patient states that yesterday when he took his blood pressure it was 117/76 and pulse was 68.  The patient says that is what it usually runs.  . Provided disease specific education to patient.  Education and support of alcohol use.  The patient has not drank in 2 weeks but admits he can not drink alone. Denies the need to talk with LCSW about alcohol use/abuse.  Feels he can stop drinking at any time. The patient also is a smoker. Smokes a pack per day. The patient says it is harder for him to put down cigarettes compared to alcohol. Empathetic listening. Information provided on resources available to help with alcohol abuse and smoking cessation. The patient verbalized understanding. 11-15-2019: The patient verbalized he is making small changes. The patient verbalized that he tries not to have alcohol in the  house. He got a DUI in 1995, therefore her does not drive when he is drinking and he also drinks less when he is in a crowd. The patient verbalized that he knows his limits. 02-06-2020: the patient states that he has not had any alcohol in 2.5 to 3 weeks. The patient praised for positive changes in his habits. The patient denies cravings. States that he will start thinking about drinking and then he will tell himself that he feels much better when he is not drinking and he sleeps better also. He doesn't wake up with that "hung over feeling".  Encouraged the patient to refrain from drinking alcohol.  . Evaluated the patients activity level. The patients favorite hobby is fishing. He use lures and likes to go bass and brim fishing.  Does a lot of casting.  [name] with appropriate clinical care team members regarding patient needs.  The patient is aware of CCM team pharmacist and LCSW.  [name] Assessed COVID Vaccine information.  The patient had 1st vaccine on 07-12-2019 and the 2nd on 08-02-2019.  He is planning on getting a booster when he can. He wants to ask Dr. Wynetta Emery  about the Shingrex and if he should get this. He states that he had a Shingles vaccine years ago but from the information he has read there is a better vaccine available. Encouraged the patient to discuss this with Dr. Wynetta Emery at his appointment in October.  . Evaluation of upcoming appointments.  The patient sees Dr. Wynetta Emery on 03-05-2020 at 11 am.   Patient Self Care Activities related to Atrial Fibrillation, HTN, HLD, and COPD:  . Patient is unable to independently self-manage chronic health conditions  Please see past updates related to this goal by clicking on the "Past Updates" button in the selected goal          Plan:   Telephone follow up appointment with care management team member scheduled for: 04-23-2020 at [place] am   [place], MSN, [facility] Mobile: [phone]

## 2020-02-06 NOTE — Patient Instructions (Signed)
Visit Information  Goals Addressed              This Visit's Progress   .  RNCM: "I am working on getting my cholesterol levels down" (pt-stated)        CARE PLAN ENTRY (see longtitudinal plan of care for additional care plan information)  Current Barriers:  . Chronic Disease Management support, education, and care coordination needs related to Atrial Fibrillation, HTN, HLD, and COPD  Clinical Goal(s) related to Atrial Fibrillation, HTN, HLD, and COPD:  Over the next 120 days, patient will:  . Work with the care management team to address educational, disease management, and care coordination needs  . Begin or continue self health monitoring activities as directed today  adhere to a heart healthy diet . Call provider office for new or worsened signs and symptoms Blood pressure findings outside established parameters, Oxygen saturation lower than established parameter, Chest pain, Shortness of breath, and New or worsened symptom related to Afib/HLD and other chronic conditions . Call care management team with questions or concerns . Verbalize basic understanding of patient centered plan of care established today  Interventions related to Atrial Fibrillation, HTN, HLD, and COPD:  . Evaluation of current treatment plans and patient's adherence to plan as established by provider.  02-06-2020: The patient states he is doing well. He is compliant with his medications regimen and denies any acute changes. The patient reports he has not had any alcohol in 2.5 to 3 weeks. The patient also reports that his PSA level is remaining good. He has been having it checked regularly since seeds were planted several years ago.  . Assessed patient understanding of disease states.  The patient has a good understanding of his chronic conditions. The patient is a good historian. Is compliant with medication regimen. Sees pcp as directed. . Assessed patient's education and care coordination needs.  The patient does  need education and support related to how to reduce cholesterol levels and foods that are good in reducing cholesterol. The patient tries to eat heart healthy; however he eats a lot of "TV dinners" due to living by himself. The patient does not have Internet access but agrees to have information mailed to him. 11-15-2019:The patient has made positive changes in his dietary habits.  He endorses losing a few pounds even. The patient states he is determined to get his cholesterol levels are down. 02-06-2020: The patient continues to monitor his food intake and watching what he eats. Endorses eating heart healthy diet.  . Evaluation of patients blood pressure readings. The patient has a wrist monitor. He knows the wrist cuff is not as accurate but he feels this is better than not taking at all. The patients readings yesterday was 117/70 with a pulse of 67.  The patient states if it is "off" he will take the reading at least 3 times.  The patient has a good understanding of hypertension and hypotension.  02-06-2020: The patient states that yesterday when he took his blood pressure it was 117/76 and pulse was 68.  The patient says that is what it usually runs.  . Provided disease specific education to patient.  Education and support of alcohol use.  The patient has not drank in 2 weeks but admits he can not drink alone. Denies the need to talk with LCSW about alcohol use/abuse.  Feels he can stop drinking at any time. The patient also is a smoker. Smokes a pack per day. The patient says it is  harder for him to put down cigarettes compared to alcohol. Empathetic listening. Information provided on resources available to help with alcohol abuse and smoking cessation. The patient verbalized understanding. 11-15-2019: The patient verbalized he is making small changes. The patient verbalized that he tries not to have alcohol in the house. He got a DUI in 1995, therefore her does not drive when he is drinking and he also drinks less  when he is in a crowd. The patient verbalized that he knows his limits. 02-06-2020: the patient states that he has not had any alcohol in 2.5 to 3 weeks. The patient praised for positive changes in his habits. The patient denies cravings. States that he will start thinking about drinking and then he will tell himself that he feels much better when he is not drinking and he sleeps better also. He doesn't wake up with that "hung over feeling".  Encouraged the patient to refrain from drinking alcohol.  . Evaluated the patients activity level. The patients favorite hobby is fishing. He use lures and likes to go bass and brim fishing.  Does a lot of casting.  [name] with appropriate clinical care team members regarding patient needs.  The patient is aware of CCM team pharmacist and LCSW.  [name] Assessed COVID Vaccine information.  The patient had 1st vaccine on 07-12-2019 and the 2nd on 08-02-2019.  He is planning on getting a booster when he can. He wants to ask Dr. Wynetta Emery about the Shingrex and if he should get this. He states that he had a Shingles vaccine years ago but from the information he has read there is a better vaccine available. Encouraged the patient to discuss this with Dr. Wynetta Emery at his appointment in October.  . Evaluation of upcoming appointments.  The patient sees Dr. Wynetta Emery on 03-05-2020 at 11 am.   Patient Self Care Activities related to Atrial Fibrillation, HTN, HLD, and COPD:  . Patient is unable to independently self-manage chronic health conditions  Please see past updates related to this goal by clicking on the "Past Updates" button in the selected goal         Patient verbalizes understanding of instructions provided today.   Telephone follow up appointment with care management team member scheduled for: 04-23-2020 at [place] am  Noreene Larsson RN, MSN, [facility] Mobile: [phone]

## 2020-02-07 ENCOUNTER — Encounter: Payer: Self-pay | Admitting: Family Medicine

## 2020-02-20 ENCOUNTER — Encounter: Payer: Self-pay | Admitting: *Deleted

## 2020-02-23 ENCOUNTER — Other Ambulatory Visit: Payer: Self-pay

## 2020-02-23 ENCOUNTER — Other Ambulatory Visit: Payer: PPO

## 2020-02-23 DIAGNOSIS — C61 Malignant neoplasm of prostate: Secondary | ICD-10-CM

## 2020-02-24 LAB — PSA: Prostate Specific Ag, Serum: 0.4 ng/mL (ref 0.0–4.0)

## 2020-02-27 ENCOUNTER — Telehealth: Payer: Self-pay

## 2020-02-27 NOTE — Telephone Encounter (Signed)
PT AWARE  

## 2020-02-27 NOTE — Telephone Encounter (Signed)
-----   Message from Hollice Espy, MD sent at 02/26/2020  4:46 PM EDT ----- PSA is great, 0.4   Hollice Espy, MD

## 2020-03-05 ENCOUNTER — Ambulatory Visit: Payer: PPO | Admitting: Family Medicine

## 2020-03-12 ENCOUNTER — Ambulatory Visit (INDEPENDENT_AMBULATORY_CARE_PROVIDER_SITE_OTHER): Payer: PPO | Admitting: Family Medicine

## 2020-03-12 ENCOUNTER — Encounter: Payer: Self-pay | Admitting: Family Medicine

## 2020-03-12 ENCOUNTER — Other Ambulatory Visit: Payer: Self-pay

## 2020-03-12 VITALS — BP 138/72 | HR 74 | Temp 98.4°F | Ht 66.35 in | Wt 164.0 lb

## 2020-03-12 DIAGNOSIS — I10 Essential (primary) hypertension: Secondary | ICD-10-CM | POA: Diagnosis not present

## 2020-03-12 DIAGNOSIS — C61 Malignant neoplasm of prostate: Secondary | ICD-10-CM | POA: Diagnosis not present

## 2020-03-12 DIAGNOSIS — J410 Simple chronic bronchitis: Secondary | ICD-10-CM | POA: Diagnosis not present

## 2020-03-12 DIAGNOSIS — E7800 Pure hypercholesterolemia, unspecified: Secondary | ICD-10-CM

## 2020-03-12 DIAGNOSIS — Z23 Encounter for immunization: Secondary | ICD-10-CM

## 2020-03-12 MED ORDER — BENAZEPRIL HCL 40 MG PO TABS: 40.0000 mg | ORAL_TABLET | Freq: Every day | ORAL | 1 refills | Status: DC

## 2020-03-12 MED ORDER — FENOFIBRATE MICRONIZED 134 MG PO CAPS: 134.0000 mg | ORAL_CAPSULE | Freq: Every day | ORAL | 1 refills | Status: DC

## 2020-03-12 MED ORDER — SPIRIVA HANDIHALER 18 MCG IN CAPS: 18.0000 ug | ORAL_CAPSULE | Freq: Every morning | RESPIRATORY_TRACT | 1 refills | Status: DC

## 2020-03-12 MED ORDER — HYDROCHLOROTHIAZIDE 25 MG PO TABS: 25.0000 mg | ORAL_TABLET | Freq: Every day | ORAL | 1 refills | Status: DC

## 2020-03-12 MED ORDER — AMLODIPINE BESYLATE 5 MG PO TABS: 5.0000 mg | ORAL_TABLET | Freq: Every day | ORAL | 1 refills | Status: DC

## 2020-03-12 NOTE — Assessment & Plan Note (Signed)
Continues to follow with Dr. Baruch Gouty. Feeling well. Has the seeds in. Continue to monitor.

## 2020-03-12 NOTE — Assessment & Plan Note (Signed)
Under good control on current regimen. Continue current regimen. Continue to monitor. Call with any concerns. Refills given. Labs drawn today.   

## 2020-03-12 NOTE — Progress Notes (Signed)
BP 138/72 (BP Location: Left Arm, Cuff Size: Normal)   Pulse 74   Temp 98.4 F (36.9 C) (Oral)   Ht 5' 6.35" (1.685 m)   Wt 164 lb (74.4 kg)   SpO2 98%   BMI 26.19 kg/m    Subjective:    Patient ID: Carl Allen, male    DOB: 1945-08-24, 74 y.o.   MRN: [id number]  HPI: Carl Allen is a 74 y.o. male  Chief Complaint  Patient presents with  . Hypertension    Follow up  . Flu Vaccine    High dose.   HYPERTENSION / HYPERLIPIDEMIA Satisfied with current treatment? yes Duration of hypertension: chronic BP monitoring frequency: not checking BP medication side effects: no Past BP meds: HCTZ, benazepril, amlodipine Duration of hyperlipidemia: chronic Cholesterol medication side effects: no Cholesterol supplements: none Past cholesterol medications: fenofibrate Medication compliance: excellent compliance Aspirin: no Recent stressors: no Recurrent headaches: no Visual changes: no Palpitations: no Dyspnea: no Chest pain: no Lower extremity edema: no Dizzy/lightheaded: no  Breathing has been doing well. Tolerating his spiriva. No concerns.   Relevant past medical, surgical, family and social history reviewed and updated as indicated. Interim medical history since our last visit reviewed. Allergies and medications reviewed and updated.  Review of Systems  Constitutional: Negative.   Respiratory: Negative.   Cardiovascular: Negative.   Musculoskeletal: Negative.   Neurological: Negative.   Psychiatric/Behavioral: Negative.     Per HPI unless specifically indicated above     Objective:    BP 138/72 (BP Location: Left Arm, Cuff Size: Normal)   Pulse 74   Temp 98.4 F (36.9 C) (Oral)   Ht 5' 6.35" (1.685 m)   Wt 164 lb (74.4 kg)   SpO2 98%   BMI 26.19 kg/m   Wt Readings from Last 3 Encounters:  03/12/20 164 lb (74.4 kg)  09/04/19 162 lb 6.4 oz (73.7 kg)  08/31/19 163 lb 6.4 oz (74.1 kg)    Physical Exam Vitals and nursing note reviewed.    Constitutional:      General: He is not in acute distress.    Appearance: Normal appearance. He is not ill-appearing, toxic-appearing or diaphoretic.  HENT:     Head: Normocephalic and atraumatic.     Right Ear: External ear normal.     Left Ear: External ear normal.     Nose: Nose normal.     Mouth/Throat:     Mouth: Mucous membranes are moist.     Pharynx: Oropharynx is clear.  Eyes:     General: No scleral icterus.       Right eye: No discharge.        Left eye: No discharge.     Extraocular Movements: Extraocular movements intact.     Conjunctiva/sclera: Conjunctivae normal.     Pupils: Pupils are equal, round, and reactive to light.  Cardiovascular:     Rate and Rhythm: Normal rate and regular rhythm.     Pulses: Normal pulses.     Heart sounds: Normal heart sounds. No murmur heard.  No friction rub. No gallop.   Pulmonary:     Effort: Pulmonary effort is normal. No respiratory distress.     Breath sounds: Normal breath sounds. No stridor. No wheezing, rhonchi or rales.  Chest:     Chest wall: No tenderness.  Musculoskeletal:        General: Normal range of motion.     Cervical back: Normal range of motion and neck supple.  Skin:    General: Skin is warm and dry.     Capillary Refill: Capillary refill takes less than 2 seconds.     Coloration: Skin is not jaundiced or pale.     Findings: No bruising, erythema, lesion or rash.  Neurological:     General: No focal deficit present.     Mental Status: He is alert and oriented to person, place, and time. Mental status is at baseline.  Psychiatric:        Mood and Affect: Mood normal.        Behavior: Behavior normal.        Thought Content: Thought content normal.        Judgment: Judgment normal.     Results for orders placed or performed in visit on 02/23/20  PSA  Result Value Ref Range   Prostate Specific Ag, Serum 0.4 0.0 - 4.0 ng/mL      Assessment & Plan:   Problem List Items Addressed This Visit       Cardiovascular and Mediastinum   Essential hypertension    Under good control on current regimen. Continue current regimen. Continue to monitor. Call with any concerns. Refills given. Labs drawn today.        Relevant Medications   amLODipine (NORVASC) 5 MG tablet   benazepril (LOTENSIN) 40 MG tablet   fenofibrate micronized (LOFIBRA) 134 MG capsule   hydrochlorothiazide (HYDRODIURIL) 25 MG tablet   Other Relevant Orders   Comprehensive metabolic panel   CBC with Differential/Platelet     Respiratory   COPD (chronic obstructive pulmonary disease) (HCC)    Under good control on current regimen. Continue current regimen. Continue to monitor. Call with any concerns. Refills given. Labs drawn today.        Relevant Medications   tiotropium (SPIRIVA HANDIHALER) 18 MCG inhalation capsule   Other Relevant Orders   Comprehensive metabolic panel   CBC with Differential/Platelet     Genitourinary   Prostate cancer ([place]) - Primary    Continues to follow with Dr. Baruch Gouty. Feeling well. Has the seeds in. Continue to monitor.         Other   Hypercholesteremia    Under good control on current regimen. Continue current regimen. Continue to monitor. Call with any concerns. Refills given. Labs drawn today.        Relevant Medications   amLODipine (NORVASC) 5 MG tablet   benazepril (LOTENSIN) 40 MG tablet   fenofibrate micronized (LOFIBRA) 134 MG capsule   hydrochlorothiazide (HYDRODIURIL) 25 MG tablet   Other Relevant Orders   Comprehensive metabolic panel   CBC with Differential/Platelet   Lipid Panel w/o Chol/HDL Ratio    Other Visit Diagnoses    Need for immunization against influenza       Relevant Orders   Flu Vaccine QUAD High Dose(Fluad) (Completed)       Follow up plan: Return in about 6 months (around 09/10/2020) for physical.

## 2020-03-12 NOTE — Patient Instructions (Signed)
Shingrix- shingles shot

## 2020-03-13 ENCOUNTER — Encounter: Payer: Self-pay | Admitting: Family Medicine

## 2020-03-13 LAB — CBC WITH DIFFERENTIAL/PLATELET
Basophils Absolute: 0 10*3/uL (ref 0.0–0.2)
Basos: 1 %
EOS (ABSOLUTE): 0.1 10*3/uL (ref 0.0–0.4)
Eos: 1 %
Hematocrit: 44 % (ref 37.5–51.0)
Hemoglobin: 14.4 g/dL (ref 13.0–17.7)
Immature Grans (Abs): 0.1 10*3/uL (ref 0.0–0.1)
Immature Granulocytes: 1 %
Lymphocytes Absolute: 1.5 10*3/uL (ref 0.7–3.1)
Lymphs: 26 %
MCH: 30.5 pg (ref 26.6–33.0)
MCHC: 32.7 g/dL (ref 31.5–35.7)
MCV: 93 fL (ref 79–97)
Monocytes Absolute: 0.5 10*3/uL (ref 0.1–0.9)
Monocytes: 9 %
Neutrophils Absolute: 3.6 10*3/uL (ref 1.4–7.0)
Neutrophils: 62 %
Platelets: 199 10*3/uL (ref 150–450)
RBC: 4.72 x10E6/uL (ref 4.14–5.80)
RDW: 13 % (ref 11.6–15.4)
WBC: 5.9 10*3/uL (ref 3.4–10.8)

## 2020-03-13 LAB — COMPREHENSIVE METABOLIC PANEL
ALT: 8 IU/L (ref 0–44)
AST: 10 IU/L (ref 0–40)
Albumin/Globulin Ratio: 1.8 (ref 1.2–2.2)
Albumin: 4.6 g/dL (ref 3.7–4.7)
Alkaline Phosphatase: 61 IU/L (ref 44–121)
BUN/Creatinine Ratio: 21 (ref 10–24)
BUN: 16 mg/dL (ref 8–27)
Bilirubin Total: 0.4 mg/dL (ref 0.0–1.2)
CO2: 28 mmol/L (ref 20–29)
Calcium: 10 mg/dL (ref 8.6–10.2)
Chloride: 103 mmol/L (ref 96–106)
Creatinine, Ser: 0.76 mg/dL (ref 0.76–1.27)
GFR calc Af Amer: 104 mL/min/{1.73_m2} (ref >59)
GFR calc non Af Amer: 90 mL/min/{1.73_m2} (ref >59)
Globulin, Total: 2.6 g/dL (ref 1.5–4.5)
Glucose: 86 mg/dL (ref 65–99)
Potassium: 4.1 mmol/L (ref 3.5–5.2)
Sodium: 142 mmol/L (ref 134–144)
Total Protein: 7.2 g/dL (ref 6.0–8.5)

## 2020-03-13 LAB — LIPID PANEL W/O CHOL/HDL RATIO
Cholesterol, Total: 209 mg/dL — ABNORMAL HIGH (ref 100–199)
HDL: 29 mg/dL — ABNORMAL LOW (ref >39)
LDL Chol Calc (NIH): 107 mg/dL — ABNORMAL HIGH (ref 0–99)
Triglycerides: 425 mg/dL — ABNORMAL HIGH (ref 0–149)
VLDL Cholesterol Cal: 73 mg/dL — ABNORMAL HIGH (ref 5–40)

## 2020-04-23 ENCOUNTER — Ambulatory Visit: Payer: Self-pay | Admitting: General Practice

## 2020-04-23 ENCOUNTER — Telehealth: Payer: Self-pay | Admitting: General Practice

## 2020-04-23 DIAGNOSIS — E7800 Pure hypercholesterolemia, unspecified: Secondary | ICD-10-CM

## 2020-04-23 DIAGNOSIS — J410 Simple chronic bronchitis: Secondary | ICD-10-CM

## 2020-04-23 DIAGNOSIS — I4891 Unspecified atrial fibrillation: Secondary | ICD-10-CM

## 2020-04-23 DIAGNOSIS — I10 Essential (primary) hypertension: Secondary | ICD-10-CM

## 2020-04-23 DIAGNOSIS — C61 Malignant neoplasm of prostate: Secondary | ICD-10-CM

## 2020-04-23 NOTE — Patient Instructions (Signed)
Visit Information  Goals Addressed              This Visit's Progress   .  RNCM: "I am working on getting my cholesterol levels down" (pt-stated)        CARE PLAN ENTRY (see longtitudinal plan of care for additional care plan information)  Current Barriers:  . Chronic Disease Management support, education, and care coordination needs related to Atrial Fibrillation, HTN, HLD, and COPD  Clinical Goal(s) related to Atrial Fibrillation, HTN, HLD, and COPD:  Over the next 120 days, patient will:  . Work with the care management team to address educational, disease management, and care coordination needs  . Begin or continue self health monitoring activities as directed today  adhere to a heart healthy diet . Call provider office for new or worsened signs and symptoms Blood pressure findings outside established parameters, Oxygen saturation lower than established parameter, Chest pain, Shortness of breath, and New or worsened symptom related to Afib/HLD and other chronic conditions . Call care management team with questions or concerns . Verbalize basic understanding of patient centered plan of care established today  Interventions related to Atrial Fibrillation, HTN, HLD, and COPD:  . Evaluation of current treatment plans and patient's adherence to plan as established by provider.  04-23-2020: The patient states he is doing well. He is compliant with his medications regimen and denies any acute changes. The patient reports he has not had any alcohol in 2.5 to 3 weeks. The patient also reports that his PSA level is remaining good- last lab of 0.4 and he is very happy about this. He has been having it checked regularly since seeds were planted several years ago.  . Assessed patient understanding of disease states.  The patient has a good understanding of his chronic conditions. The patient is a good historian. Is compliant with medication regimen. Sees pcp as directed. 04-23-2020: The patient saw  Dr. Wynetta Emery on 03-12-2020 and got a good report. Was happy that his blood work was so good.  . Assessed patient's education and care coordination needs.  The patient does need education and support related to how to reduce cholesterol levels and foods that are good in reducing cholesterol. The patient tries to eat heart healthy; however he eats a lot of "TV dinners" due to living by himself. The patient does not have Internet access but agrees to have information mailed to him. 11-15-2019:The patient has made positive changes in his dietary habits.  He endorses losing a few pounds even. The patient states he is determined to get his cholesterol levels are down. 04-23-2020: The patient continues to monitor his food intake and watching what he eats. Endorses eating heart healthy diet. He will be traveling to his brothers for the weekend to have Thanksgiving with family. He is excited about seeing his brother and family.  . Evaluation of patients blood pressure readings. The patient has a wrist monitor. He knows the wrist cuff is not as accurate but he feels this is better than not taking at all. The patients readings yesterday was 117/70 with a pulse of 67.  The patient states if it is "off" he will take the reading at least 3 times.  The patient has a good understanding of hypertension and hypotension.  02-06-2020: The patient states that yesterday when he took his blood pressure it was 117/76 and pulse was 68.  The patient says that is what it usually runs. 04-23-2020: Endorses having a good blood pressure. Denies  any acute findings.  . Provided disease specific education to patient.  Education and support of alcohol use.  The patient has not drank in 2 weeks but admits he can not drink alone. Denies the need to talk with LCSW about alcohol use/abuse.  Feels he can stop drinking at any time. The patient also is a smoker. Smokes a pack per day. The patient says it is harder for him to put down cigarettes compared to  alcohol. Empathetic listening. Information provided on resources available to help with alcohol abuse and smoking cessation. The patient verbalized understanding. 11-15-2019: The patient verbalized he is making small changes. The patient verbalized that he tries not to have alcohol in the house. He got a DUI in 1995, therefore her does not drive when he is drinking and he also drinks less when he is in a crowd. The patient verbalized that he knows his limits. 04-23-2020: the patient states that he has not had any alcohol in 2.5 to 3 weeks. The patient praised for positive changes in his habits. The patient denies cravings. States that he will start thinking about drinking and then he will tell himself that he feels much better when he is not drinking and he sleeps better also. He doesn't wake up with that "hung over feeling".  Encouraged the patient to refrain from drinking alcohol.  . Evaluated the patients activity level. The patients favorite hobby is fishing. He use lures and likes to go bass and brim fishing.  Does a lot of casting.  [name] with appropriate clinical care team members regarding patient needs.  The patient is aware of CCM team pharmacist and LCSW.  [name] Assessed COVID Vaccine information.  The patient had 1st vaccine on 07-12-2019 and the 2nd on 08-02-2019.  He is planning on getting a booster when he can. He wants to ask Dr. Wynetta Emery about the Shingrex and if he should get this. He states that he had a Shingles vaccine years ago but from the information he has read there is a better vaccine available. Encouraged the patient to discuss this with Dr. Wynetta Emery at his appointment in October.  . Evaluation of upcoming appointments.  The patient sees Dr. Wynetta Emery on 09-11-2020 at 9 am.   Patient Self Care Activities related to Atrial Fibrillation, HTN, HLD, and COPD:  . Patient is unable to independently self-manage chronic health conditions  Please see past updates related to this goal by  clicking on the "Past Updates" button in the selected goal         The patient verbalized understanding of instructions, educational materials, and care plan provided today and declined offer to receive copy of patient instructions, educational materials, and care plan.   Telephone follow up appointment with care management team member scheduled for: 06-18-2020 at [place] am  Noreene Larsson RN, MSN, [facility] Mobile: [phone]

## 2020-04-23 NOTE — Chronic Care Management (AMB) (Signed)
Chronic Care Management   Follow Up Note   04/23/2020 Name: Carl Allen MRN: [id number] DOB: 05-16-1946  Referred by: [name], MD Reason for referral : Chronic Care Management (RNCM follow up for Chronic Disease Management and Care Coordination Needs)   Carl Allen is a 74 y.o. year old male who is a primary care patient of Crissman, Jeannette How, MD. The CCM team was consulted for assistance with chronic disease management and care coordination needs.    Review of patient status, including review of consultants reports, relevant laboratory and other test results, and collaboration with appropriate care team members and the patient's provider was performed as part of comprehensive patient evaluation and provision of chronic care management services.    SDOH (Social Determinants of Health) assessments performed: Yes See Care Plan activities for detailed interventions related to [name])     Outpatient Encounter Medications as of 04/23/2020  Medication Sig  . amLODipine (NORVASC) 5 MG tablet Take 1 tablet (5 mg total) by mouth daily.  . benazepril (LOTENSIN) 40 MG tablet Take 1 tablet (40 mg total) by mouth daily.  . cetirizine (ZYRTEC) 10 MG tablet Take 10 mg by mouth daily as needed for allergies.  . fenofibrate micronized (LOFIBRA) 134 MG capsule Take 1 capsule (134 mg total) by mouth daily before breakfast.  . ferrous sulfate 325 (65 FE) MG tablet Take 325 mg by mouth daily with breakfast.  . hydrochlorothiazide (HYDRODIURIL) 25 MG tablet Take 1 tablet (25 mg total) by mouth daily.  . Multiple Vitamins-Minerals (CENTRUM SILVER 50+MEN) TABS Take 1 tablet by mouth daily.  [name] tiotropium (SPIRIVA HANDIHALER) 18 MCG inhalation capsule Place 1 capsule (18 mcg total) into inhaler and inhale every morning.   No facility-administered encounter medications on file as of 04/23/2020.     Objective:  BP Readings from Last 3 Encounters:  03/12/20 138/72  09/04/19 138/70  08/31/19 (!)  142/80    Goals Addressed              This Visit's Progress   .  RNCM: "I am working on getting my cholesterol levels down" (pt-stated)        CARE PLAN ENTRY (see longtitudinal plan of care for additional care plan information)  Current Barriers:  . Chronic Disease Management support, education, and care coordination needs related to Atrial Fibrillation, HTN, HLD, and COPD  Clinical Goal(s) related to Atrial Fibrillation, HTN, HLD, and COPD:  Over the next 120 days, patient will:  . Work with the care management team to address educational, disease management, and care coordination needs  . Begin or continue self health monitoring activities as directed today  adhere to a heart healthy diet . Call provider office for new or worsened signs and symptoms Blood pressure findings outside established parameters, Oxygen saturation lower than established parameter, Chest pain, Shortness of breath, and New or worsened symptom related to Afib/HLD and other chronic conditions . Call care management team with questions or concerns . Verbalize basic understanding of patient centered plan of care established today  Interventions related to Atrial Fibrillation, HTN, HLD, and COPD:  . Evaluation of current treatment plans and patient's adherence to plan as established by provider.  04-23-2020: The patient states he is doing well. He is compliant with his medications regimen and denies any acute changes. The patient reports he has not had any alcohol in 2.5 to 3 weeks. The patient also reports that his PSA level is remaining good- last lab of 0.4  and he is very happy about this. He has been having it checked regularly since seeds were planted several years ago.  . Assessed patient understanding of disease states.  The patient has a good understanding of his chronic conditions. The patient is a good historian. Is compliant with medication regimen. Sees pcp as directed. 04-23-2020: The patient saw Dr.  Wynetta Emery on 03-12-2020 and got a good report. Was happy that his blood work was so good.  . Assessed patient's education and care coordination needs.  The patient does need education and support related to how to reduce cholesterol levels and foods that are good in reducing cholesterol. The patient tries to eat heart healthy; however he eats a lot of "TV dinners" due to living by himself. The patient does not have Internet access but agrees to have information mailed to him. 11-15-2019:The patient has made positive changes in his dietary habits.  He endorses losing a few pounds even. The patient states he is determined to get his cholesterol levels are down. 04-23-2020: The patient continues to monitor his food intake and watching what he eats. Endorses eating heart healthy diet. He will be traveling to his brothers for the weekend to have Thanksgiving with family. He is excited about seeing his brother and family.  . Evaluation of patients blood pressure readings. The patient has a wrist monitor. He knows the wrist cuff is not as accurate but he feels this is better than not taking at all. The patients readings yesterday was 117/70 with a pulse of 67.  The patient states if it is "off" he will take the reading at least 3 times.  The patient has a good understanding of hypertension and hypotension.  02-06-2020: The patient states that yesterday when he took his blood pressure it was 117/76 and pulse was 68.  The patient says that is what it usually runs. 04-23-2020: Endorses having a good blood pressure. Denies any acute findings.  . Provided disease specific education to patient.  Education and support of alcohol use.  The patient has not drank in 2 weeks but admits he can not drink alone. Denies the need to talk with LCSW about alcohol use/abuse.  Feels he can stop drinking at any time. The patient also is a smoker. Smokes a pack per day. The patient says it is harder for him to put down cigarettes compared to  alcohol. Empathetic listening. Information provided on resources available to help with alcohol abuse and smoking cessation. The patient verbalized understanding. 11-15-2019: The patient verbalized he is making small changes. The patient verbalized that he tries not to have alcohol in the house. He got a DUI in 1995, therefore her does not drive when he is drinking and he also drinks less when he is in a crowd. The patient verbalized that he knows his limits. 04-23-2020: the patient states that he has not had any alcohol in 2.5 to 3 weeks. The patient praised for positive changes in his habits. The patient denies cravings. States that he will start thinking about drinking and then he will tell himself that he feels much better when he is not drinking and he sleeps better also. He doesn't wake up with that "hung over feeling".  Encouraged the patient to refrain from drinking alcohol.  . Evaluated the patients activity level. The patients favorite hobby is fishing. He use lures and likes to go bass and brim fishing.  Does a lot of casting.  [name] with appropriate clinical care team members  regarding patient needs.  The patient is aware of CCM team pharmacist and LCSW.  [name] Assessed COVID Vaccine information.  The patient had 1st vaccine on 07-12-2019 and the 2nd on 08-02-2019.  He is planning on getting a booster when he can. He wants to ask Dr. Wynetta Emery about the Shingrex and if he should get this. He states that he had a Shingles vaccine years ago but from the information he has read there is a better vaccine available. Encouraged the patient to discuss this with Dr. Wynetta Emery at his appointment in October.  . Evaluation of upcoming appointments.  The patient sees Dr. Wynetta Emery on 09-11-2020 at 9 am.   Patient Self Care Activities related to Atrial Fibrillation, HTN, HLD, and COPD:  . Patient is unable to independently self-manage chronic health conditions  Please see past updates related to this goal by  clicking on the "Past Updates" button in the selected goal         There are no care plans to display for this patient.   Plan:   Telephone follow up appointment with care management team member scheduled for: 06-18-2020 at [place] am   Noreene Larsson RN, MSN, [facility] Mobile: [phone]

## 2020-06-18 ENCOUNTER — Telehealth: Payer: Self-pay

## 2020-07-23 ENCOUNTER — Ambulatory Visit (INDEPENDENT_AMBULATORY_CARE_PROVIDER_SITE_OTHER): Payer: PPO | Admitting: General Practice

## 2020-07-23 ENCOUNTER — Telehealth: Payer: Self-pay | Admitting: General Practice

## 2020-07-23 DIAGNOSIS — F17200 Nicotine dependence, unspecified, uncomplicated: Secondary | ICD-10-CM

## 2020-07-23 DIAGNOSIS — J410 Simple chronic bronchitis: Secondary | ICD-10-CM

## 2020-07-23 DIAGNOSIS — I10 Essential (primary) hypertension: Secondary | ICD-10-CM

## 2020-07-23 DIAGNOSIS — E7800 Pure hypercholesterolemia, unspecified: Secondary | ICD-10-CM

## 2020-07-23 NOTE — Chronic Care Management (AMB) (Signed)
Chronic Care Management   CCM RN Visit Note  07/23/2020 Name: Carl Allen MRN: [id number] DOB: 06/09/1945  Subjective: Carl Allen is a 75 y.o. year old male who is a primary care patient of [name], [name], MD. The care management team was consulted for assistance with disease management and care coordination needs.    Engaged with patient by telephone for follow up visit in response to provider referral for case management and/or care coordination services.   Consent to Services:  The patient was given information about Chronic Care Management services, agreed to services, and gave verbal consent prior to initiation of services.  Please see initial visit note for detailed documentation.   Patient agreed to services and verbal consent obtained.   Assessment: Review of patient past medical history, allergies, medications, health status, including review of consultants reports, laboratory and other test data, was performed as part of comprehensive evaluation and provision of chronic care management services.   SDOH (Social Determinants of Health) assessments and interventions performed:    CCM Care Plan  No Known Allergies  Outpatient Encounter Medications as of 07/23/2020  Medication Sig  . amLODipine (NORVASC) 5 MG tablet Take 1 tablet (5 mg total) by mouth daily.  . benazepril (LOTENSIN) 40 MG tablet Take 1 tablet (40 mg total) by mouth daily.  . cetirizine (ZYRTEC) 10 MG tablet Take 10 mg by mouth daily as needed for allergies.  . fenofibrate micronized (LOFIBRA) 134 MG capsule Take 1 capsule (134 mg total) by mouth daily before breakfast.  . ferrous sulfate 325 (65 FE) MG tablet Take 325 mg by mouth daily with breakfast.  . hydrochlorothiazide (HYDRODIURIL) 25 MG tablet Take 1 tablet (25 mg total) by mouth daily.  . Multiple Vitamins-Minerals (CENTRUM SILVER 50+MEN) TABS Take 1 tablet by mouth daily.  [name] tiotropium (SPIRIVA HANDIHALER) 18 MCG inhalation capsule Place 1 capsule  (18 mcg total) into inhaler and inhale every morning.   No facility-administered encounter medications on file as of 07/23/2020.    Patient Active Problem List   Diagnosis Date Noted  . Prostate cancer ([place]) 08/26/2017  . Advanced care planning/counseling discussion 01/12/2017  . Chronic alcoholism ([place]) 07/14/2016  . Eczema 01/08/2016  . COPD (chronic obstructive pulmonary disease) ([place]) 01/07/2015  . Hypercholesteremia 01/07/2015  . Essential hypertension 11/09/2014  . Alcohol abuse counseling and surveillance 11/09/2014  . History of atrial fibrillation 10/12/2013    Conditions to be addressed/monitored:HTN, HLD, COPD and Smoking Cessation   Care Plan : RNCM: Management of HLD  Updates made by Vanita Ingles since 07/23/2020 12:00 AM    Problem: RNCM: Management of HLD   Priority: Medium    Long-Range Goal: RNCM: Management of HLD   Priority: Medium  Note:   Current Barriers:  . Poorly controlled hyperlipidemia, complicated by HTN, smoker  . Current antihyperlipidemic regimen: fenofibrate 134 mg daily  . Most recent lipid panel:     Component Value Date/Time   CHOL 209 (H) 03/12/2020 1144   CHOL 189 08/26/2017 1123   TRIG 425 (H) 03/12/2020 1144   TRIG 385 (H) 08/26/2017 1123   HDL 29 (L) 03/12/2020 1144   CHOLHDL 5.8 (H) 01/12/2017 0905   VLDL 77 (H) 08/26/2017 1123   LDLCALC 107 (H) 03/12/2020 1144 .   [name] ASCVD risk enhancing conditions: age >79,  HTN, current smoker . Lacks social connections . Does not contact provider office for questions/concerns  RN Care Manager Clinical Goal(s):  [name] Over the next 120 days, patient  will work with Consulting civil engineer, providers, and care team towards execution of optimized self-health management plan . patient will verbalize understanding of plan for effective management of HLD  . patient will work with [facility] and pcp to address needs related to management of HLD  . patient will attend all scheduled medical appointments: pcp on  09-11-2020 at 0900 am  Interventions: . Collaboration with Charlynne Cousins, MD regarding development and update of comprehensive plan of care as evidenced by provider attestation and co-signature . Inter-disciplinary care team collaboration (see longitudinal plan of care) . Medication review performed; medication list updated in electronic medical record.  Bertram Savin care team collaboration (see longitudinal plan of care) . Referred to pharmacy team for assistance with HLD medication management . Evaluation of current treatment plan related to HLD and patient's adherence to plan as established by provider. . Advised patient to to call the office for changes in condition or questions  . Provided education to patient re: heart healthy diet, portion sizes, managing weight and stress levels.  . Reviewed medications with patient and discussed compliance. The patient is compliant with medications . Provided patient with heart healthy diet  educational materials related to effective management of HLD and other cardiac conditions  . Discussed plans with patient for ongoing care management follow up and provided patient with direct contact information for care management team   Patient Goals/Self-Care Activities: . Over the next 120 days, patient will:   - call for medicine refill 2 or 3 days before it runs out - call if I am sick and can't take my medicine - keep a list of all the medicines I take; vitamins and herbals too - learn to read medicine labels - use a pillbox to sort medicine - use an alarm clock or phone to remind me to take my medicine - change to whole grain breads, cereal, pasta - drink 6 to 8 glasses of water each day - eat 3 to 5 servings of fruits and vegetables each day - eat 5 or 6 small meals each day - fill half the plate with nonstarchy vegetables - limit fast food meals to no more than 1 per week - manage portion size - prepare main meal at home 3 to 5 days each  week - be open to making changes - I can manage, know and watch for signs of a heart attack - if I have chest pain, call for help - learn about small changes that will make a big difference - learn my personal risk factors - barriers to meeting goals identified - change-talk evoked - choices provided - collaboration with team encouraged - decision-making supported - difficulty of making life-long changes acknowledged - health risks reviewed - problem-solving facilitated - questions answered - readiness for change evaluated - reassurance provided - self-reflection promoted - self-reliance encouraged - verbalization of feelings encouraged  Follow Up Plan: Telephone follow up appointment with care management team member scheduled for: 09-17-2020 10:30 am     Task: RNCM: Management of HLD   Note:   Care Management Activities:    - barriers to meeting goals identified - change-talk evoked - choices provided - collaboration with team encouraged - decision-making supported - difficulty of making life-long changes acknowledged - health risks reviewed - problem-solving facilitated - questions answered - readiness for change evaluated - reassurance provided - self-reflection promoted - self-reliance encouraged - verbalization of feelings encouraged       Care Plan : RNCM: Hypertension (Adult)  Updates made by Vanita Ingles since 07/23/2020 12:00 AM    Problem: RMCM: Hypertension (Hypertension)   Priority: Medium    Long-Range Goal: RNCM: Hypertension Monitored   Priority: High  Note:   Objective:  . Last practice recorded BP readings:  BP Readings from Last 3 Encounters:  03/12/20 138/72  09/04/19 138/70  08/31/19 (!) 142/80 .   [name] Most recent eGFR/CrCl: No results found for: EGFR  No components found for: CRCL Current Barriers:  [name] Knowledge Deficits related to basic understanding of hypertension pathophysiology and self care management . Knowledge Deficits related  to understanding of medications prescribed for management of hypertension . Limited Social Support . Lacks social connections . Does not contact provider office for questions/concerns Case Manager Clinical Goal(s):  [name] Over the next 120 days, patient will verbalize understanding of plan for hypertension management . Over the next 120 days, patient will attend all scheduled medical appointments: 09-11-2020 at 0900 am . Over the next 120 days, patient will demonstrate improved adherence to prescribed treatment plan for hypertension as evidenced by taking all medications as prescribed, monitoring and recording blood pressure as directed, adhering to low sodium/DASH diet . Over the next 120 days, patient will demonstrate improved health management independence as evidenced by checking blood pressure as directed and notifying PCP if SBP>160 or DBP > 90, taking all medications as prescribe, and adhering to a low sodium diet as discussed. . Over the next 120 days, patient will verbalize basic understanding of hypertension disease process and self health management plan as evidenced by medications compliance, adherence to heart healthy diet, work on smoking cessation, working with CCM team to optimize health and well being.  Interventions:  . Collaboration with Charlynne Cousins, MD regarding development and update of comprehensive plan of care as evidenced by provider attestation and co-signature . Inter-disciplinary care team collaboration (see longitudinal plan of care) . Evaluation of current treatment plan related to hypertension self management and patient's adherence to plan as established by provider. The patient states that his blood pressure has been running around 120/70. He is getting adequate rest and is actually losing weight.  . Provided education to patient re: stroke prevention, s/s of heart attack and stroke, DASH diet, complications of uncontrolled blood pressure . Reviewed medications with  patient and discussed importance of compliance . Discussed plans with patient for ongoing care management follow up and provided patient with direct contact information for care management team . Advised patient, providing education and rationale, to monitor blood pressure daily and record, calling PCP for findings outside established parameters.  . Reviewed scheduled/upcoming provider appointments including: 09-11-2020 at 0900 am Patient Goals: - blood pressure trends reviewed - depression screen reviewed - home or ambulatory blood pressure monitoring encouraged. Education on writing down numbers and bringing with him to his next appointment -work on Smoking cessation  Self-Care Activities: - Self administers medications as prescribed Attends all scheduled provider appointments Calls provider office for new concerns, questions, or BP outside discussed parameters Checks BP and records as discussed Follows a low sodium diet/DASH diet Follow Up Plan: Telephone follow up appointment with care management team member scheduled for: 09-17-2020 at 10:30 am   Task: RNCM: Identify and Monitor Blood Pressure Elevation   Note:   Care Management Activities:    - blood pressure trends reviewed - depression screen reviewed - home or ambulatory blood pressure monitoring encouraged       Care Plan : RNCM: COPD (Adult)  Updates made by Hall Busing,  Nobie Putnam since 07/23/2020 12:00 AM    Problem: RNCM: Psychological Adjustment to Diagnosis (COPD)   Priority: Medium    Long-Range Goal: RNCM: Adjustment to Disease Achieved   Priority: Medium  Note:   Current Barriers:  [name] Knowledge deficits related to basic understanding of COPD disease process . Knowledge deficits related to basic COPD self care/management . Knowledge deficit related to basic understanding of how to use inhalers and how inhaled medications work . Knowledge deficit related to importance of energy conservation . Limited Social  Support . Lacks social connections . Does not contact provider office for questions/concerns  Case Manager Clinical Goal(s):  Over the next 120 days patient will report using inhalers as prescribed including rinsing mouth after use  Over the next 120 days patient will report utilizing pursed lip breathing for shortness of breath  Over the next 120 days, patient will be able to verbalize understanding of COPD action plan and when to seek appropriate levels of medical care  Over the next 120 days, patient will engage in lite exercise as tolerated to build/regain stamina and strength and reduce shortness of breath through activity tolerance  Over the next 120 days, patient will verbalize basic understanding of COPD disease process and self care activities  Over the next 120 days, patient will not be hospitalized for COPD exacerbation as evidenced  Interventions:  . Collaboration with Charlynne Cousins, MD regarding development and update of comprehensive plan of care as evidenced by provider attestation and co-signature . Inter-disciplinary care team collaboration (see longitudinal plan of care)  Provided patient with basic written and verbal COPD education on self care/management/and exacerbation prevention   Provided patient with COPD action plan and reinforced importance of daily self assessment  Provided written and verbal instructions on pursed lip breathing and utilized returned demonstration as teach back  Provided instruction about proper use of medications used for management of COPD including inhalers  Advised patient to self assesses COPD action plan zone and make appointment with provider if in the yellow zone for 48 hours without improvement.  Provided patient with education about the role of exercise in the management of COPD  Advised patient to engage in light exercise as tolerated 3-5 days a week  Provided education about and advised patient to utilize infection prevention  strategies to reduce risk of respiratory infection  Patient Goals/Self-Care Activities:  . - decision-making supported . - depression screen reviewed . - emotional support provided . - problem-solving facilitated . - relaxation techniques promoted . - verbalization of feelings encouraged . - barriers to lifestyle changes reviewed and addressed . - barriers to treatment reviewed and addressed . - breathing techniques encouraged . - healthy lifestyle promoted . - modification of home and work environment promoted . - rescue (action) plan reviewed . - signs/symptoms of infection reviewed . - signs/symptoms of worsening disease assessed . - symptom triggers identified . - treatment plan reviewed Follow Up Plan: Telephone follow up appointment with care management team member scheduled for:  09-17-2020 at 10:30 am   Task: RNCM: Support Psychosocial Response to Chronic Obstructive Pulmonary Disease   Note:   Care Management Activities:    - decision-making supported - depression screen reviewed - emotional support provided - problem-solving facilitated - relaxation techniques promoted - verbalization of feelings encouraged       Problem: RNCM: Smoking Education and support   Priority: High    Long-Range Goal: RNCM: Smoking Cessation education and support   Priority: High  Note:  Current Barriers:  . Unable to independently stop smoking  . Lacks social connections . Does not contact provider office for questions/concerns . 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 Previous quit attempts, unsuccessful not really ever tried successful using none . Reports smoking within 30 minutes of waking up . Reports triggers to smoke include: stress, changes in social environment  . Reports motivation to quit smoking includes: knows this would be better for his health and well bein g . On a scale of 1-10, reports MOTIVATION to quit is 7 . On a scale of 1-10, reports  CONFIDENCE in quitting is 5 Clinical Goal(s):  [name] Over the next 120 days, patient will work with Chief Strategy Officer and provider towards tobacco cessation  Interventions: . Collaboration with Charlynne Cousins, MD regarding development and update of comprehensive plan of care as evidenced by provider attestation and co-signature . Inter-disciplinary care team collaboration (see longitudinal plan of care) . Evaluation of current treatment plan reviewed. The patient states he is cutting back. Was smoking a pack and a half, is now down to 3/4 a pack a day. Also is working on decrease of ETOH consumption. States today (07-23-2020) that he has not had anything to drink in 3 weeks  . Provided contact information for [facility] (1-800-QUIT-NOW). Patient knows this is a Insurance underwriter  . Discussed plans with patient for ongoing care management follow up and provided patient with direct contact information for care management team . Provided patient with printed smoking cessation educational materials . Reviewed scheduled/upcoming provider appointments including: 09-11-2020 at 0900 am . Evaluation of current treatment plan reviewed Patient Goals/Self-Care Activities . Over the next 120 days, patient will:  - alternative activities  as a habit during cravings  - verbally commit to reducing tobacco consumption Follow Up Plan: Telephone follow up appointment with care management team member scheduled for: 09-17-2020 at 10:30 am   Task: RNCM: Smoking Cessation   Note:   Care Management Activities:    - barriers to lifestyle changes reviewed and addressed - barriers to treatment reviewed and addressed - breathing techniques encouraged - healthy lifestyle promoted - modification of home and work environment promoted - rescue (action) plan reviewed - signs/symptoms of infection reviewed - signs/symptoms of worsening disease assessed - symptom triggers identified - treatment plan reviewed          Plan:Telephone follow up appointment with care management team member scheduled for:  09-17-2020 at 10:30 am  [place], MSN, [facility] Mobile: [phone]

## 2020-07-23 NOTE — Patient Instructions (Signed)
Visit Information  PATIENT GOALS: Goals Addressed              This Visit's Progress   .  COMPLETED: RNCM: "I am working on getting my cholesterol levels down" (pt-stated)        Carl Allen (see longtitudinal plan of care for additional care plan information)  Current Barriers: Closing this goal and opening in new ELS . Chronic Disease Management support, education, and care coordination needs related to Atrial Fibrillation, HTN, HLD, and COPD  Clinical Goal(s) related to Atrial Fibrillation, HTN, HLD, and COPD:  Over the next 120 days, patient will:  . Work with the care management team to address educational, disease management, and care coordination needs  . Begin or continue self health monitoring activities as directed today  adhere to a heart healthy diet . Call provider office for new or worsened signs and symptoms Blood pressure findings outside established parameters, Oxygen saturation lower than established parameter, Chest pain, Shortness of breath, and New or worsened symptom related to Afib/HLD and other chronic conditions . Call care management team with questions or concerns . Verbalize basic understanding of patient centered plan of care established today  Interventions related to Atrial Fibrillation, HTN, HLD, and COPD:  . Evaluation of current treatment plans and patient's adherence to plan as established by provider.  04-23-2020: The patient states he is doing well. He is compliant with his medications regimen and denies any acute changes. The patient reports he has not had any alcohol in 2.5 to 3 weeks. The patient also reports that his PSA level is remaining good- last lab of 0.4 and he is very happy about this. He has been having it checked regularly since seeds were planted several years ago.  . Assessed patient understanding of disease states.  The patient has a good understanding of his chronic conditions. The patient is a good historian. Is compliant with  medication regimen. Sees pcp as directed. 04-23-2020: The patient saw Dr. Wynetta Emery on 03-12-2020 and got a good report. Was happy that his blood work was so good.  . Assessed patient's education and care coordination needs.  The patient does need education and support related to how to reduce cholesterol levels and foods that are good in reducing cholesterol. The patient tries to eat heart healthy; however he eats a lot of "TV dinners" due to living by himself. The patient does not have Internet access but agrees to have information mailed to him. 11-15-2019:The patient has made positive changes in his dietary habits.  He endorses losing a few pounds even. The patient states he is determined to get his cholesterol levels are down. 04-23-2020: The patient continues to monitor his food intake and watching what he eats. Endorses eating heart healthy diet. He will be traveling to his brothers for the weekend to have Thanksgiving with family. He is excited about seeing his brother and family.  . Evaluation of patients blood pressure readings. The patient has a wrist monitor. He knows the wrist cuff is not as accurate but he feels this is better than not taking at all. The patients readings yesterday was 117/70 with a pulse of 67.  The patient states if it is "off" he will take the reading at least 3 times.  The patient has a good understanding of hypertension and hypotension.  02-06-2020: The patient states that yesterday when he took his blood pressure it was 117/76 and pulse was 68.  The patient says that is what it  usually runs. 04-23-2020: Endorses having a good blood pressure. Denies any acute findings.  . Provided disease specific education to patient.  Education and support of alcohol use.  The patient has not drank in 2 weeks but admits he can not drink alone. Denies the need to talk with LCSW about alcohol use/abuse.  Feels he can stop drinking at any time. The patient also is a smoker. Smokes a pack per day. The  patient says it is harder for him to put down cigarettes compared to alcohol. Empathetic listening. Information provided on resources available to help with alcohol abuse and smoking cessation. The patient verbalized understanding. 11-15-2019: The patient verbalized he is making small changes. The patient verbalized that he tries not to have alcohol in the house. He got a DUI in 1995, therefore her does not drive when he is drinking and he also drinks less when he is in a crowd. The patient verbalized that he knows his limits. 04-23-2020: the patient states that he has not had any alcohol in 2.5 to 3 weeks. The patient praised for positive changes in his habits. The patient denies cravings. States that he will start thinking about drinking and then he will tell himself that he feels much better when he is not drinking and he sleeps better also. He doesn't wake up with that "hung over feeling".  Encouraged the patient to refrain from drinking alcohol.  . Evaluated the patients activity level. The patients favorite hobby is fishing. He use lures and likes to go bass and brim fishing.  Does a lot of casting.  [name] with appropriate clinical care team members regarding patient needs.  The patient is aware of CCM team pharmacist and LCSW.  [name] Assessed COVID Vaccine information.  The patient had 1st vaccine on 07-12-2019 and the 2nd on 08-02-2019.  He is planning on getting a booster when he can. He wants to ask Dr. Wynetta Emery about the Shingrex and if he should get this. He states that he had a Shingles vaccine years ago but from the information he has read there is a better vaccine available. Encouraged the patient to discuss this with Dr. Wynetta Emery at his appointment in October.  . Evaluation of upcoming appointments.  The patient sees Dr. Wynetta Emery on 09-11-2020 at 9 am.   Patient Self Care Activities related to Atrial Fibrillation, HTN, HLD, and COPD:  . Patient is unable to independently self-manage chronic  health conditions  Please see past updates related to this goal by clicking on the "Past Updates" button in the selected goal         The patient verbalized understanding of instructions, educational materials, and care plan provided today and agreed to receive a mailed copy of patient instructions, educational materials, and care plan.   Telephone follow up appointment with care management team member scheduled for: 09-17-2020 at 10:30 am  [place], MSN, [facility] Mobile: [phone]   Steps to Quit Smoking Smoking tobacco is the leading cause of preventable death. It can affect almost every organ in the body. Smoking puts you and people around you at risk for many serious, long-lasting (chronic) diseases. Quitting smoking can be hard, but it is one of the best things that you can do for your health. It is never too late to quit. How do I get ready to quit? When you decide to quit smoking, make a plan to help you succeed. Before you  quit:  Pick a date to quit. Set a date within the next 2 weeks to give you time to prepare.  Write down the reasons why you are quitting. Keep this list in places where you will see it often.  Tell your family, friends, and co-workers that you are quitting. Their support is important.  Talk with your doctor about the choices that may help you quit.  Find out if your health insurance will pay for these treatments.  Know the people, places, things, and activities that make you want to smoke (triggers). Avoid them. What first steps can I take to quit smoking?  Throw away all cigarettes at home, at work, and in your car.  Throw away the things that you use when you smoke, such as ashtrays and lighters.  Clean your car. Make sure to empty the ashtray.  Clean your home, including curtains and carpets. What can I do to help me quit smoking? Talk with your doctor  about taking medicines and seeing a counselor at the same time. You are more likely to succeed when you do both.  If you are pregnant or breastfeeding, talk with your doctor about counseling or other ways to quit smoking. Do not take medicine to help you quit smoking unless your doctor tells you to do so. To quit smoking: Quit right away  Quit smoking totally, instead of slowly cutting back on how much you smoke over a period of time.  Go to counseling. You are more likely to quit if you go to counseling sessions regularly. Take medicine You may take medicines to help you quit. Some medicines need a prescription, and some you can buy over-the-counter. Some medicines may contain a drug called nicotine to replace the nicotine in cigarettes. Medicines may:  Help you to stop having the desire to smoke (cravings).  Help to stop the problems that come when you stop smoking (withdrawal symptoms). Your doctor may ask you to use:  Nicotine patches, gum, or lozenges.  Nicotine inhalers or sprays.  Non-nicotine medicine that is taken by mouth. Find resources Find resources and other ways to help you quit smoking and remain smoke-free after you quit. These resources are most helpful when you use them often. They include:  Online chats with a Social worker.  Phone quitlines.  Printed Furniture conservator/restorer.  Support groups or group counseling.  Text messaging programs.  Mobile phone apps. Use apps on your mobile phone or tablet that can help you stick to your quit plan. There are many free apps for mobile phones and tablets as well as websites. Examples include Quit Guide from the [employer] and smokefree.gov   What things can I do to make it easier to quit?  Talk to your family and friends. Ask them to support and encourage you.  Call a phone quitline (1-800-QUIT-NOW), reach out to support groups, or work with a Social worker.  Ask people who smoke to not smoke around you.  Avoid places that make you  want to smoke, such as: ? Bars. ? Parties. ? Smoke-break areas at work.  Spend time with people who do not smoke.  Lower the stress in your life. Stress can make you want to smoke. Try these things to help your stress: ? Getting regular exercise. ? Doing deep-breathing exercises. ? Doing yoga. ? Meditating. ? Doing a body scan. To do this, close your eyes, focus on one area of your body at a time from head to toe. Notice which parts of your body  are tense. Try to relax the muscles in those areas.   How will I feel when I quit smoking? Day 1 to 3 weeks Within the first 24 hours, you may start to have some problems that come from quitting tobacco. These problems are very bad 2-3 days after you quit, but they do not often last for more than 2-3 weeks. You may get these symptoms:  Mood swings.  Feeling restless, nervous, angry, or annoyed.  Trouble concentrating.  Dizziness.  Strong desire for high-sugar foods and nicotine.  Weight gain.  Trouble pooping (constipation).  Feeling like you may vomit (nausea).  Coughing or a sore throat.  Changes in how the medicines that you take for other issues work in your body.  Depression.  Trouble sleeping (insomnia). Week 3 and afterward After the first 2-3 weeks of quitting, you may start to notice more positive results, such as:  Better sense of smell and taste.  Less coughing and sore throat.  Slower heart rate.  Lower blood pressure.  Clearer skin.  Better breathing.  Fewer sick days. Quitting smoking can be hard. Do not give up if you fail the first time. Some people need to try a few times before they succeed. Do your best to stick to your quit plan, and talk with your doctor if you have any questions or concerns. Summary  Smoking tobacco is the leading cause of preventable death. Quitting smoking can be hard, but it is one of the best things that you can do for your health.  When you decide to quit smoking, make a  plan to help you succeed.  Quit smoking right away, not slowly over a period of time.  When you start quitting, seek help from your doctor, family, or friends. This information is not intended to replace advice given to you by your health care provider. Make sure you discuss any questions you have with your health care provider. Document Revised: 02/10/2019 Document Reviewed: 08/06/2018 Elsevier Patient Education  [place].  [email or web address]">  DASH Eating Plan DASH stands for Dietary Approaches to Stop Hypertension. The DASH eating plan is a healthy eating plan that has been shown to:  Reduce high blood pressure (hypertension).  Reduce your risk for type 2 diabetes, heart disease, and stroke.  Help with weight loss. What are tips for following this plan? Reading food labels  Check food labels for the amount of salt (sodium) per serving. Choose foods with less than 5 percent of the Daily Value of sodium. Generally, foods with less than 300 milligrams (mg) of sodium per serving fit into this eating plan.  To find whole grains, look for the word "whole" as the first word in the ingredient list. Shopping  Buy products labeled as "low-sodium" or "no salt added."  Buy fresh foods. Avoid canned foods and pre-made or frozen meals. Cooking  Avoid adding salt when cooking. Use salt-free seasonings or herbs instead of table salt or sea salt. Check with your health care provider or pharmacist before using salt substitutes.  Do not fry foods. Cook foods using healthy methods such as baking, boiling, grilling, roasting, and broiling instead.  Cook with heart-healthy oils, such as olive, canola, avocado, soybean, or sunflower oil. Meal planning  Eat a balanced diet that includes: ? 4 or more servings of fruits and 4 or more servings of vegetables each day. Try to fill one-half of your plate with fruits and vegetables. ? 6-8 servings  of whole grains each day. ? Less  than 6 oz (170 g) of lean meat, poultry, or fish each day. A 3-oz (85-g) serving of meat is about the same size as a deck of cards. One egg equals 1 oz (28 g). ? 2-3 servings of low-fat dairy each day. One serving is 1 cup (237 mL). ? 1 serving of nuts, seeds, or beans 5 times each week. ? 2-3 servings of heart-healthy fats. Healthy fats called omega-3 fatty acids are found in foods such as walnuts, flaxseeds, fortified milks, and eggs. These fats are also found in cold-water fish, such as sardines, salmon, and mackerel.  Limit how much you eat of: ? Canned or prepackaged foods. ? Food that is high in trans fat, such as some fried foods. ? Food that is high in saturated fat, such as fatty meat. ? Desserts and other sweets, sugary drinks, and other foods with added sugar. ? Full-fat dairy products.  Do not salt foods before eating.  Do not eat more than 4 egg yolks a week.  Try to eat at least 2 vegetarian meals a week.  Eat more home-cooked food and less restaurant, buffet, and fast food.   Lifestyle  When eating at a restaurant, ask that your food be prepared with less salt or no salt, if possible.  If you drink alcohol: ? Limit how much you use to:  0-1 drink a day for women who are not pregnant.  0-2 drinks a day for men. ? Be aware of how much alcohol is in your drink. In the [place] one drink equals one 12 oz bottle of beer (355 mL), one 5 oz glass of wine (148 mL), or one 1 oz glass of hard liquor (44 mL). General information  Avoid eating more than 2,300 mg of salt a day. If you have hypertension, you may need to reduce your sodium intake to 1,500 mg a day.  Work with your health care provider to maintain a healthy body weight or to lose weight. Ask what an ideal weight is for you.  Get at least 30 minutes of exercise that causes your heart to beat faster (aerobic exercise) most days of the week. Activities may include walking, swimming, or  biking.  Work with your health care provider or dietitian to adjust your eating plan to your individual calorie needs. What foods should I eat? Fruits All fresh, dried, or frozen fruit. Canned fruit in natural juice (without added sugar). Vegetables Fresh or frozen vegetables (raw, steamed, roasted, or grilled). Low-sodium or reduced-sodium tomato and vegetable juice. Low-sodium or reduced-sodium tomato sauce and tomato paste. Low-sodium or reduced-sodium canned vegetables. Grains Whole-grain or whole-wheat bread. Whole-grain or whole-wheat pasta. Brown rice. [place]. Bulgur. Whole-grain and low-sodium cereals. Pita bread. Low-fat, low-sodium crackers. Whole-wheat flour tortillas. Meats and other proteins Skinless chicken or [place]. Ground chicken or [place]. Pork with fat trimmed off. Fish and seafood. Egg whites. Dried beans, peas, or lentils. Unsalted nuts, nut butters, and seeds. Unsalted canned beans. Lean cuts of beef with fat trimmed off. Low-sodium, lean precooked or cured meat, such as sausages or meat loaves. Dairy Low-fat (1%) or fat-free (skim) milk. Reduced-fat, low-fat, or fat-free cheeses. Nonfat, low-sodium ricotta or cottage cheese. Low-fat or nonfat yogurt. Low-fat, low-sodium cheese. Fats and oils Soft margarine without trans fats. Vegetable oil. Reduced-fat, low-fat, or light mayonnaise and salad dressings (reduced-sodium). Canola, safflower, olive, avocado, soybean, and sunflower oils. Avocado. Seasonings and condiments Herbs. Spices. Seasoning mixes without salt. Other foods Unsalted popcorn and pretzels. Fat-free sweets. The items  listed above may not be a complete list of foods and beverages you can eat. Contact a dietitian for more information. What foods should I avoid? Fruits Canned fruit in a light or heavy syrup. Fried fruit. Fruit in cream or butter sauce. Vegetables Creamed or fried vegetables. Vegetables in a cheese sauce. Regular canned vegetables (not  low-sodium or reduced-sodium). Regular canned tomato sauce and paste (not low-sodium or reduced-sodium). Regular tomato and vegetable juice (not low-sodium or reduced-sodium). Angie Fava. Olives. Grains Baked goods made with fat, such as croissants, muffins, or some breads. Dry pasta or rice meal packs. Meats and other proteins Fatty cuts of meat. Ribs. Fried meat. Berniece Salines. Bologna, salami, and other precooked or cured meats, such as sausages or meat loaves. Fat from the back of a pig (fatback). Bratwurst. Salted nuts and seeds. Canned beans with added salt. Canned or smoked fish. Whole eggs or egg yolks. Chicken or [place] with skin. Dairy Whole or 2% milk, cream, and half-and-half. Whole or full-fat cream cheese. Whole-fat or sweetened yogurt. Full-fat cheese. Nondairy creamers. Whipped toppings. Processed cheese and cheese spreads. Fats and oils Butter. Stick margarine. Lard. Shortening. Ghee. Bacon fat. Tropical oils, such as coconut, palm kernel, or palm oil. Seasonings and condiments Onion salt, garlic salt, seasoned salt, table salt, and sea salt. Worcestershire sauce. Tartar sauce. Barbecue sauce. Teriyaki sauce. Soy sauce, including reduced-sodium. Steak sauce. Canned and packaged gravies. Fish sauce. Oyster sauce. Cocktail sauce. Store-bought horseradish. Ketchup. Mustard. Meat flavorings and tenderizers. Bouillon cubes. Hot sauces. Pre-made or packaged marinades. Pre-made or packaged taco seasonings. Relishes. Regular salad dressings. Other foods Salted popcorn and pretzels. The items listed above may not be a complete list of foods and beverages you should avoid. Contact a dietitian for more information. Where to find more information  National Heart, Lung, and Blood Institute: https://wilson-eaton.com/  American Heart Association: www.heart.org  Academy of Nutrition and Dietetics: www.eatright.[place]: www.kidney.org Summary  The DASH eating plan is a healthy eating  plan that has been shown to reduce high blood pressure (hypertension). It may also reduce your risk for type 2 diabetes, heart disease, and stroke.  When on the DASH eating plan, aim to eat more fresh fruits and vegetables, whole grains, lean proteins, low-fat dairy, and heart-healthy fats.  With the DASH eating plan, you should limit salt (sodium) intake to 2,300 mg a day. If you have hypertension, you may need to reduce your sodium intake to 1,500 mg a day.  Work with your health care provider or dietitian to adjust your eating plan to your individual calorie needs. This information is not intended to replace advice given to you by your health care provider. Make sure you discuss any questions you have with your health care provider. Document Revised: 04/21/2019 Document Reviewed: 04/21/2019 Elsevier Patient Education  2021 [place] Lip Breathing Pursed lip breathing is a technique to relieve the feeling of being short of breath. Some long-term respiratory conditions, such as chronic obstructive pulmonary disease (COPD) and severe asthma, can make it hard to breathe out (exhale) all the air in your lungs. This can cause air that has less oxygen than normal to build up in your lungs (air trapping). Trapped air means your lungs fill with less fresh air when you breathe in, or inhale. As a result, you feel short of breath. Pursed lip breathing keeps your airways open longer when you exhale and empties more air from your lungs. This makes more space for fresh air when  you inhale. Pursed lip breathing can also slow down your breathing and keep your body from having to work so hard to breathe. Over time, pursed lip breathing may help you be able to be more physically active and do more activities. How to perform pursed lip breathing Being short of breath can make you tense and anxious. Before you start this breathing exercise, take a minute to relax your shoulders and close your eyes.  Then: Start the exercise by closing your mouth. Breathe in through your nose, taking a normal breath. You can do this at your normal rate of breathing. If you feel you are not getting enough air, breathe in while slowly counting to 2 or 3. Pucker (purse) your lips as if you were going to whistle. Gently tighten the muscles of your abdomenor press on your abdomen to help push the air out. Breathe out slowly through your pursed lips. Take at least twice as long to breathe out as it takes you to breathe in. Make sure that you breathe out all of the air, but do not force air out. Repeat the exercise until your breathing improves. Ask your health care provider how often and how long to do this exercise.   Follow these instructions at home: Take over-the-counter and prescription medicines only as told by your health care provider. Return to your normal activities as told by your health care provider. Ask your health care provider what activities are safe for you. Do not use any products that contain nicotine or tobacco, such as cigarettes, e-cigarettes, and chewing tobacco. If you need help quitting, ask your health care provider. Keep all follow-up visits as told by your health care provider. This is important. Where to find more information American Lung Association: www.lung.org Contact a health care provider if: Your shortness of breath gets worse. You become less able to exercise or be active. You develop a cough. You develop a fever. You experience problems with this breathing technique. Get help right away if: You are struggling to breathe. Your shortness of breath prevents you from doing any activity. These symptoms may represent a serious problem that is an emergency. Do not wait to see if the symptoms will go away. Get medical help right away. Call your local emergency services (911 in the [place] Do not drive yourself to the hospital. Summary Pursed lip breathing is a breathing  technique that helps to remove trapped air from your lungs. This technique helps you get more oxygen into your lungs. Pursed lip breathing can help slow down your breathing and keeps your body from having to work so hard to breathe. Over time, pursed lip breathing may help you be able to be more physically active. When performing this technique, take at least twice as long to breathe out as it takes you to breathe in. Ask your health care provider how often and how long you should do pursed lip breathing. This information is not intended to replace advice given to you by your health care provider. Make sure you discuss any questions you have with your health care provider. Document Revised: 05/01/2019 Document Reviewed: 05/02/2019 Elsevier Patient Education  2021 [employer].

## 2020-08-21 NOTE — Progress Notes (Deleted)
08/22/2020 8:42 AM   Carl Allen 08-24-1945 [id number]  Referring provider: [name], MD [address],  [place],  [place] [address]  No chief complaint on file.  Urological history: 1.  Prostate cancer -PSA 0.4 in 01/2020 -intermediate risk prostate cancer  -initially diagnosed with low risk prostate cancer in 2018 -repeat biopsy which showed a more aggressive, Gleason 3+4 disease and 2 of the cores -brachytherapy seeds in 12/2017   2. LU TS -I PSS *** -PVR ***  HPI: ***   Reviewed referral notes.    PMH: Past Medical History:  Diagnosis Date  . Allergy   . Anxiety    not often  . Arthritis    fingers, knees  . Cancer ([place])   . Cataract   . Chronic alcoholism ([place])   . COPD (chronic obstructive pulmonary disease) ([place])   . Depression    pt states sometimes  . Dyspnea   . Emphysema of lung ([place])   . GERD (gastroesophageal reflux disease)    pt states every now and then  . Hypertension   . Hypertriglyceridemia     Surgical History: Past Surgical History:  Procedure Laterality Date  . CATARACT EXTRACTION, BILATERAL  2014  . COLONOSCOPY  2012  . CYSTOSCOPY N/A 01/10/2018   Procedure: CYSTOSCOPY;  Surgeon: Hollice Espy, MD;  Location: [facility] ORS;  Service: Urology;  Laterality: N/A;  . RADIOACTIVE SEED IMPLANT N/A 01/10/2018   Procedure: RADIOACTIVE SEED IMPLANT/BRACHYTHERAPY IMPLANT;  Surgeon: Hollice Espy, MD;  Location: [facility] ORS;  Service: Urology;  Laterality: N/A;  . TONSILLECTOMY    . TONSILLECTOMY AND ADENOIDECTOMY  1952    Home Medications:  Allergies as of 08/22/2020   No Known Allergies     Medication List       Accurate as of August 21, 2020  8:42 AM. If you have any questions, ask your nurse or doctor.        amLODipine 5 MG tablet Commonly known as: NORVASC Take 1 tablet (5 mg total) by mouth daily.   benazepril 40 MG tablet Commonly known as: LOTENSIN Take 1 tablet (40 mg total) by mouth daily.   Centrum  Silver 50+Men Tabs Take 1 tablet by mouth daily.   cetirizine 10 MG tablet Commonly known as: ZYRTEC Take 10 mg by mouth daily as needed for allergies.   fenofibrate micronized 134 MG capsule Commonly known as: LOFIBRA Take 1 capsule (134 mg total) by mouth daily before breakfast.   ferrous sulfate 325 (65 FE) MG tablet Take 325 mg by mouth daily with breakfast.   hydrochlorothiazide 25 MG tablet Commonly known as: HYDRODIURIL Take 1 tablet (25 mg total) by mouth daily.   Spiriva HandiHaler 18 MCG inhalation capsule Generic drug: tiotropium Place 1 capsule (18 mcg total) into inhaler and inhale every morning.       Allergies: No Known Allergies  Family History: Family History  Problem Relation Age of Onset  . Arthritis Mother   . Heart disease Mother   . Hearing loss Mother   . Hyperlipidemia Mother   . Hypertension Mother   . Kidney disease Mother   . Miscarriages / [place] Mother   . Vision loss Mother   . Stroke Father   . Heart disease Maternal Grandmother   . Stroke Maternal Grandfather   . Cancer Paternal Grandfather     Social History:  reports that he has been smoking. He has been smoking about 1.00 pack per day. He has  never used smokeless tobacco. He reports current alcohol use of about 3.0 standard drinks of alcohol per week. He reports that he does not use drugs.  ROS: Pertinent ROS in HPI  Physical Exam: There were no vitals taken for this visit.  Constitutional:  Well nourished. Alert and oriented, No acute distress. HEENT: [place] AT, moist mucus membranes.  Trachea midline, no masses. Cardiovascular: No clubbing, cyanosis, or edema. Respiratory: Normal respiratory effort, no increased work of breathing. GI: Abdomen is soft, non tender, non distended, no abdominal masses. Liver and spleen not palpable.  No hernias appreciated.  Stool sample for occult testing is not indicated.   GU: No CVA tenderness.  No bladder fullness or masses.  Patient with  circumcised/uncircumcised phallus. ***Foreskin easily retracted***  Urethral meatus is patent.  No penile discharge. No penile lesions or rashes. Scrotum without lesions, cysts, rashes and/or edema.  Testicles are located scrotally bilaterally. No masses are appreciated in the testicles. Left and right epididymis are normal. Rectal: Patient with  normal sphincter tone. Anus and perineum without scarring or rashes. No rectal masses are appreciated. Prostate is approximately *** grams, *** nodules are appreciated. Seminal vesicles are normal. Skin: No rashes, bruises or suspicious lesions. Lymph: No cervical or inguinal adenopathy. Neurologic: Grossly intact, no focal deficits, moving all 4 extremities. Psychiatric: Normal mood and affect.  Laboratory Data: Lab Results  Component Value Date   WBC 5.9 03/12/2020   HGB 14.4 03/12/2020   HCT 44.0 03/12/2020   MCV 93 03/12/2020   PLT 199 03/12/2020    Lab Results  Component Value Date   CREATININE 0.76 03/12/2020    Lab Results  Component Value Date   TSH 2.370 09/04/2019       Component Value Date/Time   CHOL 209 (H) 03/12/2020 1144   CHOL 189 08/26/2017 1123   HDL 29 (L) 03/12/2020 1144   CHOLHDL 5.8 (H) 01/12/2017 0905   VLDL 77 (H) 08/26/2017 1123   LDLCALC 107 (H) 03/12/2020 1144    Lab Results  Component Value Date   AST 10 03/12/2020   Lab Results  Component Value Date   ALT 8 03/12/2020    Urinalysis Component     Latest Ref Rng & Units 09/04/2019  Specific Gravity, UA     1.005 - 1.030 1.015  pH, UA     5.0 - 7.5 6.0  Color, UA     Yellow Yellow  Appearance Ur     Clear Clear  Leukocytes,UA     Negative Negative  Protein,UA     Negative/Trace Negative  Glucose, UA     Negative Negative  Ketones, UA     Negative Negative  RBC, UA     Negative Negative  Bilirubin, UA     Negative Negative  Urobilinogen, Ur     0.2 - 1.0 mg/dL 0.2  Nitrite, UA     Negative Negative  I have reviewed the  labs.   Pertinent Imaging: ***  Assessment & Plan:  ***  1. Prostate cancer -PSA pending  2. LU TS -***  No follow-ups on file.  These notes generated with voice recognition software. I apologize for typographical errors.  Zara Council, PA-C  [facility] [address]  [place], [place] [address] [phone]

## 2020-08-22 ENCOUNTER — Ambulatory Visit: Payer: Self-pay | Admitting: Urology

## 2020-08-26 NOTE — Progress Notes (Signed)
08/27/2020 12:10 PM   Carl Allen [birth year] [id number]  Referring provider: [name], MD [address]  [place],  [place] [address]  Chief Complaint  Patient presents with  . Prostate Cancer   Urological history: 1.  Prostate cancer -PSA 0.4 in 01/2020 -intermediate risk prostate cancer  -initially diagnosed with low risk prostate cancer in 2018 -repeat biopsy which showed a more aggressive, Gleason 3+4 disease and 2 of the cores -brachytherapy seeds in 12/2017   2. LU TS -I PSS 6/1 -PVR 13 mL  HPI: Carl Allen is a 75 y.o. male who presents today for follow up.   He has a long time history of frequency.  Patient denies any modifying or aggravating factors.  Patient denies any gross hematuria, dysuria or suprapubic/flank pain.  Patient denies any fevers, chills, nausea or vomiting.    IPSS    Row Name 08/27/20 1100         International Prostate Symptom Score   How often have you had the sensation of not emptying your bladder? Less than half the time     How often have you had to urinate less than every two hours? About half the time     How often have you found you stopped and started again several times when you urinated? Not at All     How often have you found it difficult to postpone urination? Less than 1 in 5 times     How often have you had a weak urinary stream? Not at All     How often have you had to strain to start urination? Not at All     How many times did you typically get up at night to urinate? None     Total IPSS Score 6           Quality of Life due to urinary symptoms   If you were to spend the rest of your life with your urinary condition just the way it is now how would you feel about that? Pleased            Score:  1-7 Mild 8-19 Moderate 20-35 Severe  PMH: Past Medical History:  Diagnosis Date  . Allergy   . Anxiety    not often  . Arthritis    fingers, knees  . Cancer ([place])   . Cataract   . Chronic  alcoholism ([place])   . COPD (chronic obstructive pulmonary disease) ([place])   . Depression    pt states sometimes  . Dyspnea   . Emphysema of lung ([place])   . GERD (gastroesophageal reflux disease)    pt states every now and then  . Hypertension   . Hypertriglyceridemia     Surgical History: Past Surgical History:  Procedure Laterality Date  . CATARACT EXTRACTION, BILATERAL  2014  . COLONOSCOPY  2012  . CYSTOSCOPY N/A 01/10/2018   Procedure: CYSTOSCOPY;  Surgeon: Hollice Espy, MD;  Location: [facility] OR[facility];  Service: Urology;  Laterality: N/A;  . RADIOACTIVE SEED IMPLANT N/A 01/10/2018   Procedure: RADIOACTIVE SEED IMPLANT/BRACHYTHERAPY IMPLANT;  Surgeon: Hollice Espy, MD;  Location: [facility] OR[facility];  Service: Urology;  Laterality: N/A;  . TONSILLECTOMY    . TONSILLECTOMY AND ADENOIDECTOMY  1952    Home Medications:  Allergies as of 08/27/2020   No Known Allergies     Medication List       Accurate as of August 27, 2020 12:10 PM. If you have any  questions, ask your nurse or doctor.        amLODipine 5 MG tablet Commonly known as: NORVASC Take 1 tablet (5 mg total) by mouth daily.   benazepril 40 MG tablet Commonly known as: LOTENSIN Take 1 tablet (40 mg total) by mouth daily.   Centrum Silver 50+Men Tabs Take 1 tablet by mouth daily.   cetirizine 10 MG tablet Commonly known as: ZYRTEC Take 10 mg by mouth daily as needed for allergies.   fenofibrate micronized 134 MG capsule Commonly known as: LOFIBRA Take 1 capsule (134 mg total) by mouth daily before breakfast.   ferrous sulfate 325 (65 FE) MG tablet Take 325 mg by mouth daily with breakfast.   hydrochlorothiazide 25 MG tablet Commonly known as: HYDRODIURIL Take 1 tablet (25 mg total) by mouth daily.   Spiriva HandiHaler 18 MCG inhalation capsule Generic drug: tiotropium Place 1 capsule (18 mcg total) into inhaler and inhale every morning.       Allergies: No Known Allergies  Family History: Family History   Problem Relation Age of Onset  . Arthritis Mother   . Heart disease Mother   . Hearing loss Mother   . Hyperlipidemia Mother   . Hypertension Mother   . Kidney disease Mother   . Miscarriages / [place] Mother   . Vision loss Mother   . Stroke Father   . Heart disease Maternal Grandmother   . Stroke Maternal Grandfather   . Cancer Paternal Grandfather     Social History:  reports that he has been smoking. He has been smoking about 1.00 pack per day. He has never used smokeless tobacco. He reports current alcohol use of about 3.0 standard drinks of alcohol per week. He reports that he does not use drugs.  ROS: Pertinent ROS in HPI  Physical Exam: BP (!) 167/92   Pulse 73   Ht 5' 6.35" (1.685 m)   Wt 153 lb 3.2 oz (69.5 kg)   BMI 24.47 kg/m   Constitutional:  Well nourished. Alert and oriented, No acute distress. HEENT: [place] AT, mask in place.  Trachea midline Cardiovascular: No clubbing, cyanosis, or edema. Respiratory: Normal respiratory effort, no increased work of breathing. Neurologic: Grossly intact, no focal deficits, moving all 4 extremities. Psychiatric: Normal mood and affect.  Laboratory Data: Lab Results  Component Value Date   WBC 5.9 03/12/2020   HGB 14.4 03/12/2020   HCT 44.0 03/12/2020   MCV 93 03/12/2020   PLT 199 03/12/2020    Lab Results  Component Value Date   CREATININE 0.76 03/12/2020    Lab Results  Component Value Date   TSH 2.370 09/04/2019       Component Value Date/Time   CHOL 209 (H) 03/12/2020 1144   CHOL 189 08/26/2017 1123   HDL 29 (L) 03/12/2020 1144   CHOLHDL 5.8 (H) 01/12/2017 0905   VLDL 77 (H) 08/26/2017 1123   LDLCALC 107 (H) 03/12/2020 1144    Lab Results  Component Value Date   AST 10 03/12/2020   Lab Results  Component Value Date   ALT 8 03/12/2020    Urinalysis Component     Latest Ref Rng & Units 09/04/2019  Specific Gravity, UA     1.005 - 1.030 1.015  pH, UA     5.0 - 7.5 6.0  Color, UA      Yellow Yellow  Appearance Ur     Clear Clear  Leukocytes,UA     Negative Negative  Protein,UA  Negative/Trace Negative  Glucose, UA     Negative Negative  Ketones, UA     Negative Negative  RBC, UA     Negative Negative  Bilirubin, UA     Negative Negative  Urobilinogen, Ur     0.2 - 1.0 mg/dL 0.2  Nitrite, UA     Negative Negative  I have reviewed the labs.   Pertinent Imaging: Results for DEMONTRAY, FRANTA "DAVID" (MRN [id number]) as of 08/27/2020 11:50  Ref. Range 08/27/2020 11:46  Scan Result Unknown 13    Assessment & Plan:    1. Prostate cancer -PSA pending  2. LU TS -long standing frequency  -not bothersome to patient   Return in about 6 months (around 02/27/2021) for IPSS, PSA and PVR .  These notes generated with voice recognition software. I apologize for typographical errors.  Zara Council, PA-C  [facility] [address]  [place], [place] [address] [phone]

## 2020-08-27 ENCOUNTER — Ambulatory Visit: Payer: PPO | Admitting: Urology

## 2020-08-27 ENCOUNTER — Encounter: Payer: Self-pay | Admitting: Urology

## 2020-08-27 ENCOUNTER — Other Ambulatory Visit: Payer: Self-pay

## 2020-08-27 VITALS — BP 167/92 | HR 73 | Ht 66.35 in | Wt 153.2 lb

## 2020-08-27 DIAGNOSIS — C61 Malignant neoplasm of prostate: Secondary | ICD-10-CM | POA: Diagnosis not present

## 2020-08-27 DIAGNOSIS — R399 Unspecified symptoms and signs involving the genitourinary system: Secondary | ICD-10-CM | POA: Diagnosis not present

## 2020-08-27 LAB — BLADDER SCAN AMB NON-IMAGING: Scan Result: 13

## 2020-08-28 LAB — PSA: Prostate Specific Ag, Serum: 0.5 ng/mL (ref 0.0–4.0)

## 2020-09-04 ENCOUNTER — Ambulatory Visit: Payer: PPO | Admitting: Radiation Oncology

## 2020-09-11 ENCOUNTER — Encounter: Payer: PPO | Admitting: Family Medicine

## 2020-09-11 ENCOUNTER — Ambulatory Visit: Payer: PPO | Admitting: Radiation Oncology

## 2020-09-17 ENCOUNTER — Telehealth: Payer: Self-pay

## 2020-09-18 ENCOUNTER — Encounter: Payer: Self-pay | Admitting: Radiation Oncology

## 2020-09-18 ENCOUNTER — Ambulatory Visit
Admission: RE | Admit: 2020-09-18 | Discharge: 2020-09-18 | Disposition: A | Discharge status: Home / Self Care | Payer: PPO | Source: Ambulatory Visit | Attending: Radiation Oncology | Admitting: Radiation Oncology

## 2020-09-18 ENCOUNTER — Other Ambulatory Visit: Payer: Self-pay

## 2020-09-18 VITALS — BP 134/82 | HR 72 | Temp 96.1°F | Wt 151.3 lb

## 2020-09-18 DIAGNOSIS — Z923 Personal history of irradiation: Secondary | ICD-10-CM | POA: Insufficient documentation

## 2020-09-18 DIAGNOSIS — Z08 Encounter for follow-up examination after completed treatment for malignant neoplasm: Secondary | ICD-10-CM | POA: Diagnosis not present

## 2020-09-18 DIAGNOSIS — C61 Malignant neoplasm of prostate: Secondary | ICD-10-CM | POA: Diagnosis not present

## 2020-09-18 NOTE — Progress Notes (Signed)
Radiation Oncology Follow up Note  Name: Carl Allen   Date:   09/18/2020 MRN:  [id number] DOB: 01-09-1946    This 75 y.o. male presents to the clinic today for 2 and half year follow-up status post I-125 interstitial implant for Gleason 6 adenocarcinoma the prostate presenting with a PSA of 5.  REFERRING PROVIDER: [name], MD  HPI: Patient is a 75 year old male now out 2 and half years having undergone I-125 interstitial implant for Gleason 6 adenocarcinoma prostate presenting with a PSA of 5 seen today in routine follow-up he is doing well he has extremely low side effect profile very little lower urinary tract symptoms no diarrhea fatigue..  Most recent PSA is 0.5     6 months ago was 0.4.  COMPLICATIONS OF TREATMENT: none  FOLLOW UP COMPLIANCE: keeps appointments   PHYSICAL EXAM:  BP 134/82   Pulse 72   Temp (!) 96.1 F (35.6 C) (Tympanic)   Wt 151 lb 4.8 oz (68.6 kg)   BMI 24.16 kg/m  Well-developed well-nourished patient in NAD. HEENT reveals PERLA, EOMI, discs not visualized.  Oral cavity is clear. No oral mucosal lesions are identified. Neck is clear without evidence of cervical or supraclavicular adenopathy. Lungs are clear to A&P. Cardiac examination is essentially unremarkable with regular rate and rhythm without murmur rub or thrill. Abdomen is benign with no organomegaly or masses noted. Motor sensory and DTR levels are equal and symmetric in the upper and lower extremities. Cranial nerves II through XII are grossly intact. Proprioception is intact. No peripheral adenopathy or edema is identified. No motor or sensory levels are noted. Crude visual fields are within normal range.  RADIOLOGY RESULTS: No current films for review  PLAN: Present time patient remains under excellent biochemical control of his prostate cancer and pleased with his overall progress.  Of asked to see him back in 1 year with a PSA at that time.  Patient knows to call with any concerns.  I  would like to take this opportunity to thank you for allowing me to participate in the care of your patient.Noreene Filbert, MD

## 2020-10-04 ENCOUNTER — Encounter: Payer: PPO | Admitting: Internal Medicine

## 2020-10-18 ENCOUNTER — Telehealth: Payer: Self-pay | Admitting: General Practice

## 2020-10-18 ENCOUNTER — Ambulatory Visit (INDEPENDENT_AMBULATORY_CARE_PROVIDER_SITE_OTHER): Payer: PPO | Admitting: General Practice

## 2020-10-18 DIAGNOSIS — J410 Simple chronic bronchitis: Secondary | ICD-10-CM

## 2020-10-18 DIAGNOSIS — E7800 Pure hypercholesterolemia, unspecified: Secondary | ICD-10-CM

## 2020-10-18 DIAGNOSIS — F1020 Alcohol dependence, uncomplicated: Secondary | ICD-10-CM

## 2020-10-18 DIAGNOSIS — I10 Essential (primary) hypertension: Secondary | ICD-10-CM

## 2020-10-18 DIAGNOSIS — F17200 Nicotine dependence, unspecified, uncomplicated: Secondary | ICD-10-CM

## 2020-10-18 NOTE — Chronic Care Management (AMB) (Signed)
Chronic Care Management   CCM RN Visit Note  10/18/2020 Name: Carl Allen MRN: [id number] DOB: 11/27/1945  Subjective: Carl Allen is a 75 y.o. year old male who is a primary care patient of [name], [name], MD. The care management team was consulted for assistance with disease management and care coordination needs.    Engaged with patient by telephone for follow up visit in response to provider referral for case management and/or care coordination services.   Consent to Services:  The patient was given information about Chronic Care Management services, agreed to services, and gave verbal consent prior to initiation of services.  Please see initial visit note for detailed documentation.   Patient agreed to services and verbal consent obtained.   Assessment: Review of patient past medical history, allergies, medications, health status, including review of consultants reports, laboratory and other test data, was performed as part of comprehensive evaluation and provision of chronic care management services.   SDOH (Social Determinants of Health) assessments and interventions performed:    CCM Care Plan  No Known Allergies  Outpatient Encounter Medications as of 10/18/2020  Medication Sig  . amLODipine (NORVASC) 5 MG tablet Take 1 tablet (5 mg total) by mouth daily.  . benazepril (LOTENSIN) 40 MG tablet Take 1 tablet (40 mg total) by mouth daily.  . cetirizine (ZYRTEC) 10 MG tablet Take 10 mg by mouth daily as needed for allergies.  . fenofibrate micronized (LOFIBRA) 134 MG capsule Take 1 capsule (134 mg total) by mouth daily before breakfast.  . ferrous sulfate 325 (65 FE) MG tablet Take 325 mg by mouth daily with breakfast.  . hydrochlorothiazide (HYDRODIURIL) 25 MG tablet Take 1 tablet (25 mg total) by mouth daily.  . Multiple Vitamins-Minerals (CENTRUM SILVER 50+MEN) TABS Take 1 tablet by mouth daily.  Carl Allen tiotropium (SPIRIVA HANDIHALER) 18 MCG inhalation capsule Place 1 capsule  (18 mcg total) into inhaler and inhale every morning.   No facility-administered encounter medications on file as of 10/18/2020.    Patient Active Problem List   Diagnosis Date Noted  . Prostate cancer ([place]) 08/26/2017  . Advanced care planning/counseling discussion 01/12/2017  . Chronic alcoholism ([place]) 07/14/2016  . Eczema 01/08/2016  . COPD (chronic obstructive pulmonary disease) ([place]) 01/07/2015  . Hypercholesteremia 01/07/2015  . Essential hypertension 11/09/2014  . Alcohol abuse counseling and surveillance 11/09/2014  . History of atrial fibrillation 10/12/2013    Conditions to be addressed/monitored:HTN, HLD, COPD and Smoker and use of ETOH  Care Plan : RNCM: Management of HLD  Updates made by Vanita Ingles since 10/18/2020 12:00 AM    Problem: RNCM: Management of HLD   Priority: Medium    Long-Range Goal: RNCM: Management of HLD   Priority: Medium  Note:   Current Barriers:  . Poorly controlled hyperlipidemia, complicated by HTN, smoker  . Current antihyperlipidemic regimen: fenofibrate 134 mg daily  . Most recent lipid panel:     Component Value Date/Time   CHOL 209 (H) 03/12/2020 1144   CHOL 189 08/26/2017 1123   TRIG 425 (H) 03/12/2020 1144   TRIG 385 (H) 08/26/2017 1123   HDL 29 (L) 03/12/2020 1144   CHOLHDL 5.8 (H) 01/12/2017 0905   VLDL 77 (H) 08/26/2017 1123   LDLCALC 107 (H) 03/12/2020 1144 .   Carl Allen ASCVD risk enhancing conditions: age >55,  HTN, current smoker . Lacks social connections . Does not contact provider office for questions/concerns  RN Care Manager Clinical Goal(s):  Carl Allen Over the next 120  days, patient will work with Consulting civil engineer, providers, and care team towards execution of optimized self-health management plan . patient will verbalize understanding of plan for effective management of HLD  . patient will work with [facility] and pcp to address needs related to management of HLD  . patient will attend all scheduled medical appointments: pcp on  11-04-2020 at 0920 am  Interventions: . Collaboration with Charlynne Cousins, MD regarding development and update of comprehensive plan of care as evidenced by provider attestation and co-signature . Inter-disciplinary care team collaboration (see longitudinal plan of care) . Medication review performed; medication list updated in electronic medical record.  Bertram Savin care team collaboration (see longitudinal plan of care) . Referred to pharmacy team for assistance with HLD medication management . Evaluation of current treatment plan related to HLD and patient's adherence to plan as established by provider. 10-18-2020: The patient is doing well and is actually eating healthier than normal. States he is cutting down on his alcohol use and praised the patient for this accomplishment. He wants to maintain his health and well being. Has lost weight and is now down to 151/152 pounds.  . Advised patient to to call the office for changes in condition or questions. 10-18-2020: Encouraged the patient to write down questions to ask the pcp on his upcoming visit on 11-04-2020.  Carl Allen Provided education to patient re: heart healthy diet, portion sizes, managing weight and stress levels. 10-18-2020: The patient received his information in the mail and is appreciative. The patient states he has been reading up on the information. Also had follow up for his prostate cancer and states that he is "cancer free" or in "remission".  . Reviewed medications with patient and discussed compliance. 10-18-2020: The patient is compliant with medications . Provided patient with heart healthy diet  educational materials related to effective management of HLD and other cardiac conditions  . Discussed plans with patient for ongoing care management follow up and provided patient with direct contact information for care management team   Patient Goals/Self-Care Activities: . Over the next 120 days, patient will:   - call for medicine  refill 2 or 3 days before it runs out - call if I am sick and can't take my medicine - keep a list of all the medicines I take; vitamins and herbals too - learn to read medicine labels - use a pillbox to sort medicine - use an alarm clock or phone to remind me to take my medicine - change to whole grain breads, cereal, pasta - drink 6 to 8 glasses of water each day - eat 3 to 5 servings of fruits and vegetables each day - eat 5 or 6 small meals each day - fill half the plate with nonstarchy vegetables - limit fast food meals to no more than 1 per week - manage portion size - prepare main meal at home 3 to 5 days each week - be open to making changes - I can manage, know and watch for signs of a heart attack - if I have chest pain, call for help - learn about small changes that will make a big difference - learn my personal risk factors - barriers to meeting goals identified - change-talk evoked - choices provided - collaboration with team encouraged - decision-making supported - difficulty of making life-long changes acknowledged - health risks reviewed - problem-solving facilitated - questions answered - readiness for change evaluated - reassurance provided - self-reflection promoted - self-reliance encouraged -  verbalization of feelings encouraged  Follow Up Plan: Telephone follow up appointment with care management team member scheduled for: 12-20-2020 10:30 am     Care Plan : RNCM: Hypertension (Adult)  Updates made by Vanita Ingles since 10/18/2020 12:00 AM    Problem: RMCM: Hypertension (Hypertension)   Priority: Medium    Long-Range Goal: RNCM: Hypertension Monitored   Priority: High  Note:   Objective:  . Last practice recorded BP readings:  . BP Readings from Last 3 Encounters: .  09/18/20 . 134/82 .  08/27/20 . (!) 167/92 .  03/12/20 . 138/72 .    Carl Allen Most recent eGFR/CrCl: No results found for: EGFR  No components found for: CRCL Current Barriers:   Carl Allen Knowledge Deficits related to basic understanding of hypertension pathophysiology and self care management . Knowledge Deficits related to understanding of medications prescribed for management of hypertension . Limited Social Support . Lacks social connections . Does not contact provider office for questions/concerns Case Manager Clinical Goal(s):  Carl Allen Over the next 120 days, patient will verbalize understanding of plan for hypertension management . Over the next 120 days, patient will attend all scheduled medical appointments: 11-04-2020 at 0920 am . Over the next 120 days, patient will demonstrate improved adherence to prescribed treatment plan for hypertension as evidenced by taking all medications as prescribed, monitoring and recording blood pressure as directed, adhering to low sodium/DASH diet . Over the next 120 days, patient will demonstrate improved health management independence as evidenced by checking blood pressure as directed and notifying PCP if SBP>150 or DBP > 90, taking all medications as prescribe, and adhering to a low sodium diet as discussed. . Over the next 120 days, patient will verbalize basic understanding of hypertension disease process and self health management plan as evidenced by medications compliance, adherence to heart healthy diet, work on smoking cessation, working with CCM team to optimize health and well being.  Interventions:  . Collaboration with Charlynne Cousins, MD regarding development and update of comprehensive plan of care as evidenced by provider attestation and co-signature . Inter-disciplinary care team collaboration (see longitudinal plan of care) . Evaluation of current treatment plan related to hypertension self management and patient's adherence to plan as established by provider. The patient states that his blood pressure has been running around 120/70. He is getting adequate rest and is actually losing weight. 10-18-2020: The patient is doing well and  denies any issues with his blood pressure. His eating healthier and also he is doing well with cutting back on his alcohol consumption. The patient states he has lost weight and is down to 151/152.Praised for accomplishments.  . Provided education to patient re: stroke prevention, s/s of heart attack and stroke, DASH diet, complications of uncontrolled blood pressure . Reviewed medications with patient and discussed importance of compliance. 10-18-2020: The patient is compliant with medications regimen. Denies any new needs at this time.  . Discussed plans with patient for ongoing care management follow up and provided patient with direct contact information for care management team . Advised patient, providing education and rationale, to monitor blood pressure daily and record, calling PCP for findings outside established parameters.  . Reviewed scheduled/upcoming provider appointments including: 11-04-2020 at 0920 am Patient Goals: - blood pressure trends reviewed - depression screen reviewed - home or ambulatory blood pressure monitoring encouraged. Education on writing down numbers and bringing with him to his next appointment -work on Smoking cessation  Self-Care Activities: - Self administers medications as prescribed Attends  all scheduled provider appointments Calls provider office for new concerns, questions, or BP outside discussed parameters Checks BP and records as discussed Follows a low sodium diet/DASH diet Follow Up Plan: Telephone follow up appointment with care management team member scheduled for: 12-20-2020 at 10:30 am   Care Plan : RNCM: COPD (Adult)  Updates made by Vanita Ingles since 10/18/2020 12:00 AM    Problem: RNCM: Psychological Adjustment to Diagnosis (COPD)   Priority: Medium    Long-Range Goal: RNCM: Adjustment to Disease Achieved   Priority: Medium  Note:   Current Barriers:  Carl Allen Knowledge deficits related to basic understanding of COPD disease  process . Knowledge deficits related to basic COPD self care/management . Knowledge deficit related to basic understanding of how to use inhalers and how inhaled medications work . Knowledge deficit related to importance of energy conservation . Limited Social Support . Lacks social connections . Does not contact provider office for questions/concerns  Case Manager Clinical Goal(s):  Over the next 120 days patient will report using inhalers as prescribed including rinsing mouth after use  Over the next 120 days patient will report utilizing pursed lip breathing for shortness of breath  Over the next 120 days, patient will be able to verbalize understanding of COPD action plan and when to seek appropriate levels of medical care  Over the next 120 days, patient will engage in lite exercise as tolerated to build/regain stamina and strength and reduce shortness of breath through activity tolerance  Over the next 120 days, patient will verbalize basic understanding of COPD disease process and self care activities  Over the next 120 days, patient will not be hospitalized for COPD exacerbation as evidenced  Interventions:  . Collaboration with Charlynne Cousins, MD regarding development and update of comprehensive plan of care as evidenced by provider attestation and co-signature . Inter-disciplinary care team collaboration (see longitudinal plan of care)  Provided patient with basic written and verbal COPD education on self care/management/and exacerbation prevention. 10-18-2020: The patient states that he has had some trouble with the pollen and he has been taking the dollar general equivalent to zyrtec. States this is effective in managing his allergy challenges.   Provided patient with COPD action plan and reinforced importance of daily self assessment  Provided written and verbal instructions on pursed lip breathing and utilized returned demonstration as teach back  Provided instruction about  proper use of medications used for management of COPD including inhalers  Advised patient to self assesses COPD action plan zone and make appointment with provider if in the yellow zone for 48 hours without improvement.  Provided patient with education about the role of exercise in the management of COPD  Advised patient to engage in light exercise as tolerated 3-5 days a week  Provided education about and advised patient to utilize infection prevention strategies to reduce risk of respiratory infection. 10-18-2020: Review and the patient denies any acute distress. The patient is mindful of factors that exacerbate his condition.  Patient Goals/Self-Care Activities:  . - decision-making supported . - depression screen reviewed . - emotional support provided . - problem-solving facilitated . - relaxation techniques promoted . - verbalization of feelings encouraged . - barriers to lifestyle changes reviewed and addressed . - barriers to treatment reviewed and addressed . - breathing techniques encouraged . - healthy lifestyle promoted . - modification of home and work environment promoted . - rescue (action) plan reviewed . - signs/symptoms of infection reviewed . - signs/symptoms of worsening  disease assessed . - symptom triggers identified . - treatment plan reviewed Follow Up Plan: Telephone follow up appointment with care management team member scheduled for:  12-20-2020 at 10:30 am   Problem: RNCM: Smoking Education and support   Priority: High    Long-Range Goal: RNCM: Smoking Cessation education and support   Priority: High  Note:   Current Barriers:  . Unable to independently stop smoking  . Lacks social connections . Does not contact provider office for questions/concerns . 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 Previous quit attempts, unsuccessful not really ever tried successful using none . Reports smoking within 30 minutes of waking  up . Reports triggers to smoke include: stress, changes in social environment  . Reports motivation to quit smoking includes: knows this would be better for his health and well bein g . On a scale of 1-10, reports MOTIVATION to quit is 7 . On a scale of 1-10, reports CONFIDENCE in quitting is 5 Clinical Goal(s):  Carl Allen Over the next 120 days, patient will work with Chief Strategy Officer and provider towards tobacco cessation  Interventions: . Collaboration with Charlynne Cousins, MD regarding development and update of comprehensive plan of care as evidenced by provider attestation and co-signature . Inter-disciplinary care team collaboration (see longitudinal plan of care) . Evaluation of current treatment plan reviewed. The patient states he is cutting back. Was smoking a pack and a half, is now down to 3/4 a pack a day. Also is working on decrease of ETOH consumption. States today (07-23-2020) that he has not had anything to drink in 3 weeks. 10-18-2020: The patient states that he is doing well. He states he has cut back on his drinking.. Praised for great progress.  . Provided contact information for [place] [facility] (1-800-QUIT-NOW). Patient knows this is a Insurance underwriter  . Discussed plans with patient for ongoing care management follow up and provided patient with direct contact information for care management team . Provided patient with printed smoking cessation educational materials . Reviewed scheduled/upcoming provider appointments including: 09-11-2020 at 0900 am . Evaluation of current treatment plan reviewed Patient Goals/Self-Care Activities . Over the next 120 days, patient will:  - alternative activities  as a habit during cravings  - verbally commit to reducing tobacco consumption Follow Up Plan: Telephone follow up appointment with care management team member scheduled for: 12-20-2020 at 10:30 am     Plan:Telephone follow up appointment with care management team member scheduled for:   12-20-2020 at 42 am   [place], MSN, [facility] Mobile: [phone]

## 2020-10-18 NOTE — Patient Instructions (Signed)
Visit Information  PATIENT GOALS: Patient Care Plan: RNCM: Management of HLD    Problem Identified: RNCM: Management of HLD   Priority: Medium    Long-Range Goal: RNCM: Management of HLD   Priority: Medium  Note:   Current Barriers:  . Poorly controlled hyperlipidemia, complicated by HTN, smoker  . Current antihyperlipidemic regimen: fenofibrate 134 mg daily  . Most recent lipid panel:     Component Value Date/Time   CHOL 209 (H) 03/12/2020 1144   CHOL 189 08/26/2017 1123   TRIG 425 (H) 03/12/2020 1144   TRIG 385 (H) 08/26/2017 1123   HDL 29 (L) 03/12/2020 1144   CHOLHDL 5.8 (H) 01/12/2017 0905   VLDL 77 (H) 08/26/2017 1123   LDLCALC 107 (H) 03/12/2020 1144 .   [name] ASCVD risk enhancing conditions: age >4,  HTN, current smoker . Lacks social connections . Does not contact provider office for questions/concerns  RN Care Manager Clinical Goal(s):  [name] Over the next 120 days, patient will work with Consulting civil engineer, providers, and care team towards execution of optimized self-health management plan . patient will verbalize understanding of plan for effective management of HLD  . patient will work with [facility] and pcp to address needs related to management of HLD  . patient will attend all scheduled medical appointments: pcp on 11-04-2020 at 0920 am  Interventions: . Collaboration with Charlynne Cousins, MD regarding development and update of comprehensive plan of care as evidenced by provider attestation and co-signature . Inter-disciplinary care team collaboration (see longitudinal plan of care) . Medication review performed; medication list updated in electronic medical record.  Bertram Savin care team collaboration (see longitudinal plan of care) . Referred to pharmacy team for assistance with HLD medication management . Evaluation of current treatment plan related to HLD and patient's adherence to plan as established by provider. 10-18-2020: The patient is doing well and is actually  eating healthier than normal. States he is cutting down on his alcohol use and praised the patient for this accomplishment. He wants to maintain his health and well being. Has lost weight and is now down to 151/152 pounds.  . Advised patient to to call the office for changes in condition or questions. 10-18-2020: Encouraged the patient to write down questions to ask the pcp on his upcoming visit on 11-04-2020.  [name] Provided education to patient re: heart healthy diet, portion sizes, managing weight and stress levels. 10-18-2020: The patient received his information in the mail and is appreciative. The patient states he has been reading up on the information. Also had follow up for his prostate cancer and states that he is "cancer free" or in "remission".  . Reviewed medications with patient and discussed compliance. 10-18-2020: The patient is compliant with medications . Provided patient with heart healthy diet  educational materials related to effective management of HLD and other cardiac conditions  . Discussed plans with patient for ongoing care management follow up and provided patient with direct contact information for care management team   Patient Goals/Self-Care Activities: . Over the next 120 days, patient will:   - call for medicine refill 2 or 3 days before it runs out - call if I am sick and can't take my medicine - keep a list of all the medicines I take; vitamins and herbals too - learn to read medicine labels - use a pillbox to sort medicine - use an alarm clock or phone to remind me to take my medicine - change to whole grain  breads, cereal, pasta - drink 6 to 8 glasses of water each day - eat 3 to 5 servings of fruits and vegetables each day - eat 5 or 6 small meals each day - fill half the plate with nonstarchy vegetables - limit fast food meals to no more than 1 per week - manage portion size - prepare main meal at home 3 to 5 days each week - be open to making changes - I can  manage, know and watch for signs of a heart attack - if I have chest pain, call for help - learn about small changes that will make a big difference - learn my personal risk factors - barriers to meeting goals identified - change-talk evoked - choices provided - collaboration with team encouraged - decision-making supported - difficulty of making life-long changes acknowledged - health risks reviewed - problem-solving facilitated - questions answered - readiness for change evaluated - reassurance provided - self-reflection promoted - self-reliance encouraged - verbalization of feelings encouraged  Follow Up Plan: Telephone follow up appointment with care management team member scheduled for: 12-20-2020 10:30 am     Task: RNCM: Management of HLD   Note:   Care Management Activities:    - barriers to meeting goals identified - change-talk evoked - choices provided - collaboration with team encouraged - decision-making supported - difficulty of making life-long changes acknowledged - health risks reviewed - problem-solving facilitated - questions answered - readiness for change evaluated - reassurance provided - self-reflection promoted - self-reliance encouraged - verbalization of feelings encouraged       Patient Care Plan: RNCM: Hypertension (Adult)    Problem Identified: RMCM: Hypertension (Hypertension)   Priority: Medium    Long-Range Goal: RNCM: Hypertension Monitored   Priority: High  Note:   Objective:  . Last practice recorded BP readings:  . BP Readings from Last 3 Encounters: .  09/18/20 . 134/82 .  08/27/20 . (!) 167/92 .  03/12/20 . 138/72 .    [name] Most recent eGFR/CrCl: No results found for: EGFR  No components found for: CRCL Current Barriers:  [name] Knowledge Deficits related to basic understanding of hypertension pathophysiology and self care management . Knowledge Deficits related to understanding of medications prescribed for management of  hypertension . Limited Social Support . Lacks social connections . Does not contact provider office for questions/concerns Case Manager Clinical Goal(s):  [name] Over the next 120 days, patient will verbalize understanding of plan for hypertension management . Over the next 120 days, patient will attend all scheduled medical appointments: 11-04-2020 at 0920 am . Over the next 120 days, patient will demonstrate improved adherence to prescribed treatment plan for hypertension as evidenced by taking all medications as prescribed, monitoring and recording blood pressure as directed, adhering to low sodium/DASH diet . Over the next 120 days, patient will demonstrate improved health management independence as evidenced by checking blood pressure as directed and notifying PCP if SBP>150 or DBP > 90, taking all medications as prescribe, and adhering to a low sodium diet as discussed. . Over the next 120 days, patient will verbalize basic understanding of hypertension disease process and self health management plan as evidenced by medications compliance, adherence to heart healthy diet, work on smoking cessation, working with CCM team to optimize health and well being.  Interventions:  . Collaboration with Charlynne Cousins, MD regarding development and update of comprehensive plan of care as evidenced by provider attestation and co-signature . Inter-disciplinary care team collaboration (see longitudinal plan of  care) . Evaluation of current treatment plan related to hypertension self management and patient's adherence to plan as established by provider. The patient states that his blood pressure has been running around 120/70. He is getting adequate rest and is actually losing weight. 10-18-2020: The patient is doing well and denies any issues with his blood pressure. His eating healthier and also he is doing well with cutting back on his alcohol consumption. The patient states he has lost weight and is down to  151/152.Praised for accomplishments.  . Provided education to patient re: stroke prevention, s/s of heart attack and stroke, DASH diet, complications of uncontrolled blood pressure . Reviewed medications with patient and discussed importance of compliance. 10-18-2020: The patient is compliant with medications regimen. Denies any new needs at this time.  . Discussed plans with patient for ongoing care management follow up and provided patient with direct contact information for care management team . Advised patient, providing education and rationale, to monitor blood pressure daily and record, calling PCP for findings outside established parameters.  . Reviewed scheduled/upcoming provider appointments including: 11-04-2020 at 0920 am Patient Goals: - blood pressure trends reviewed - depression screen reviewed - home or ambulatory blood pressure monitoring encouraged. Education on writing down numbers and bringing with him to his next appointment -work on Smoking cessation  Self-Care Activities: - Self administers medications as prescribed Attends all scheduled provider appointments Calls provider office for new concerns, questions, or BP outside discussed parameters Checks BP and records as discussed Follows a low sodium diet/DASH diet Follow Up Plan: Telephone follow up appointment with care management team member scheduled for: 12-20-2020 at 10:30 am   Task: RNCM: Identify and Monitor Blood Pressure Elevation   Note:   Care Management Activities:    - blood pressure trends reviewed - depression screen reviewed - home or ambulatory blood pressure monitoring encouraged       Patient Care Plan: RNCM: COPD (Adult)    Problem Identified: RNCM: Psychological Adjustment to Diagnosis (COPD)   Priority: Medium    Long-Range Goal: RNCM: Adjustment to Disease Achieved   Priority: Medium  Note:   Current Barriers:  [name] Knowledge deficits related to basic understanding of COPD disease  process . Knowledge deficits related to basic COPD self care/management . Knowledge deficit related to basic understanding of how to use inhalers and how inhaled medications work . Knowledge deficit related to importance of energy conservation . Limited Social Support . Lacks social connections . Does not contact provider office for questions/concerns  Case Manager Clinical Goal(s):  Over the next 120 days patient will report using inhalers as prescribed including rinsing mouth after use  Over the next 120 days patient will report utilizing pursed lip breathing for shortness of breath  Over the next 120 days, patient will be able to verbalize understanding of COPD action plan and when to seek appropriate levels of medical care  Over the next 120 days, patient will engage in lite exercise as tolerated to build/regain stamina and strength and reduce shortness of breath through activity tolerance  Over the next 120 days, patient will verbalize basic understanding of COPD disease process and self care activities  Over the next 120 days, patient will not be hospitalized for COPD exacerbation as evidenced  Interventions:  . Collaboration with Charlynne Cousins, MD regarding development and update of comprehensive plan of care as evidenced by provider attestation and co-signature . Inter-disciplinary care team collaboration (see longitudinal plan of care)  Provided patient with basic written  and verbal COPD education on self care/management/and exacerbation prevention. 10-18-2020: The patient states that he has had some trouble with the pollen and he has been taking the dollar general equivalent to zyrtec. States this is effective in managing his allergy challenges.   Provided patient with COPD action plan and reinforced importance of daily self assessment  Provided written and verbal instructions on pursed lip breathing and utilized returned demonstration as teach back  Provided instruction about  proper use of medications used for management of COPD including inhalers  Advised patient to self assesses COPD action plan zone and make appointment with provider if in the yellow zone for 48 hours without improvement.  Provided patient with education about the role of exercise in the management of COPD  Advised patient to engage in light exercise as tolerated 3-5 days a week  Provided education about and advised patient to utilize infection prevention strategies to reduce risk of respiratory infection. 10-18-2020: Review and the patient denies any acute distress. The patient is mindful of factors that exacerbate his condition.  Patient Goals/Self-Care Activities:  . - decision-making supported . - depression screen reviewed . - emotional support provided . - problem-solving facilitated . - relaxation techniques promoted . - verbalization of feelings encouraged . - barriers to lifestyle changes reviewed and addressed . - barriers to treatment reviewed and addressed . - breathing techniques encouraged . - healthy lifestyle promoted . - modification of home and work environment promoted . - rescue (action) plan reviewed . - signs/symptoms of infection reviewed . - signs/symptoms of worsening disease assessed . - symptom triggers identified . - treatment plan reviewed Follow Up Plan: Telephone follow up appointment with care management team member scheduled for:  12-20-2020 at 10:30 am   Task: RNCM: Support Psychosocial Response to Chronic Obstructive Pulmonary Disease   Note:   Care Management Activities:    - decision-making supported - depression screen reviewed - emotional support provided - problem-solving facilitated - relaxation techniques promoted - verbalization of feelings encouraged       Problem Identified: RNCM: Smoking Education and support   Priority: High    Long-Range Goal: RNCM: Smoking Cessation education and support   Priority: High  Note:   Current  Barriers:  . Unable to independently stop smoking  . Lacks social connections . Does not contact provider office for questions/concerns . 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 Previous quit attempts, unsuccessful not really ever tried successful using none . Reports smoking within 30 minutes of waking up . Reports triggers to smoke include: stress, changes in social environment  . Reports motivation to quit smoking includes: knows this would be better for his health and well bein g . On a scale of 1-10, reports MOTIVATION to quit is 7 . On a scale of 1-10, reports CONFIDENCE in quitting is 5 Clinical Goal(s):  [name] Over the next 120 days, patient will work with Chief Strategy Officer and provider towards tobacco cessation  Interventions: . Collaboration with Charlynne Cousins, MD regarding development and update of comprehensive plan of care as evidenced by provider attestation and co-signature . Inter-disciplinary care team collaboration (see longitudinal plan of care) . Evaluation of current treatment plan reviewed. The patient states he is cutting back. Was smoking a pack and a half, is now down to 3/4 a pack a day. Also is working on decrease of ETOH consumption. States today (07-23-2020) that he has not had anything to drink in 3 weeks. 10-18-2020:  The patient states that he is doing well. He states he has cut back on his drinking.. Praised for great progress.  . Provided contact information for [place] [facility] (1-800-QUIT-NOW). Patient knows this is a Insurance underwriter  . Discussed plans with patient for ongoing care management follow up and provided patient with direct contact information for care management team . Provided patient with printed smoking cessation educational materials . Reviewed scheduled/upcoming provider appointments including: 09-11-2020 at 0900 am . Evaluation of current treatment plan reviewed Patient Goals/Self-Care Activities . Over the next 120 days, patient  will:  - alternative activities  as a habit during cravings  - verbally commit to reducing tobacco consumption Follow Up Plan: Telephone follow up appointment with care management team member scheduled for: 12-20-2020 at 10:30 am   Task: RNCM: Smoking Cessation   Note:   Care Management Activities:    - barriers to lifestyle changes reviewed and addressed - barriers to treatment reviewed and addressed - breathing techniques encouraged - healthy lifestyle promoted - modification of home and work environment promoted - rescue (action) plan reviewed - signs/symptoms of infection reviewed - signs/symptoms of worsening disease assessed - symptom triggers identified - treatment plan reviewed         The patient verbalized understanding of instructions, educational materials, and care plan provided today and declined offer to receive copy of patient instructions, educational materials, and care plan.   Telephone follow up appointment with care management team member scheduled for: 12-20-2020 at 29 am  Noreene Larsson RN, MSN, [facility] Mobile: [phone]

## 2020-11-04 ENCOUNTER — Ambulatory Visit (INDEPENDENT_AMBULATORY_CARE_PROVIDER_SITE_OTHER): Payer: PPO | Admitting: Internal Medicine

## 2020-11-04 ENCOUNTER — Other Ambulatory Visit: Payer: Self-pay

## 2020-11-04 ENCOUNTER — Encounter: Payer: Self-pay | Admitting: Internal Medicine

## 2020-11-04 VITALS — BP 126/79 | HR 59 | Temp 98.0°F | Ht 66.61 in | Wt 152.2 lb

## 2020-11-04 DIAGNOSIS — J410 Simple chronic bronchitis: Secondary | ICD-10-CM

## 2020-11-04 DIAGNOSIS — E7800 Pure hypercholesterolemia, unspecified: Secondary | ICD-10-CM | POA: Diagnosis not present

## 2020-11-04 DIAGNOSIS — Z122 Encounter for screening for malignant neoplasm of respiratory organs: Secondary | ICD-10-CM

## 2020-11-04 DIAGNOSIS — I10 Essential (primary) hypertension: Secondary | ICD-10-CM | POA: Diagnosis not present

## 2020-11-04 DIAGNOSIS — Z0000 Encounter for general adult medical examination without abnormal findings: Secondary | ICD-10-CM

## 2020-11-04 DIAGNOSIS — D649 Anemia, unspecified: Secondary | ICD-10-CM | POA: Diagnosis not present

## 2020-11-04 LAB — URINALYSIS, ROUTINE W REFLEX MICROSCOPIC
Bilirubin, UA: NEGATIVE
Glucose, UA: NEGATIVE
Ketones, UA: NEGATIVE
Leukocytes,UA: NEGATIVE
Nitrite, UA: NEGATIVE
Protein,UA: NEGATIVE
Specific Gravity, UA: <1.005 — ABNORMAL LOW (ref 1.005–1.030)
Urobilinogen, Ur: 0.2 mg/dL (ref 0.2–1.0)
pH, UA: 6 (ref 5.0–7.5)

## 2020-11-04 LAB — MICROSCOPIC EXAMINATION
Bacteria, UA: NONE SEEN
Epithelial Cells (non renal): NONE SEEN /hpf (ref 0–10)
WBC, UA: NONE SEEN /hpf (ref 0–5)

## 2020-11-04 MED ORDER — AMLODIPINE BESYLATE 5 MG PO TABS: 5.0000 mg | ORAL_TABLET | Freq: Every day | ORAL | 1 refills | Status: DC

## 2020-11-04 MED ORDER — FENOFIBRATE MICRONIZED 134 MG PO CAPS: 134.0000 mg | ORAL_CAPSULE | Freq: Every day | ORAL | 1 refills | Status: DC

## 2020-11-04 MED ORDER — BENAZEPRIL HCL 40 MG PO TABS: 40.0000 mg | ORAL_TABLET | Freq: Every day | ORAL | 1 refills | Status: DC

## 2020-11-04 MED ORDER — HYDROCHLOROTHIAZIDE 25 MG PO TABS: 25.0000 mg | ORAL_TABLET | Freq: Every day | ORAL | 1 refills | Status: DC

## 2020-11-04 MED ORDER — SPIRIVA HANDIHALER 18 MCG IN CAPS
18.0000 ug | ORAL_CAPSULE | Freq: Every morning | RESPIRATORY_TRACT | 1 refills | Status: DC
Start: 2020-11-04 — End: 2021-05-15

## 2020-11-04 NOTE — Progress Notes (Signed)
BP 126/79   Pulse (!) 59   Temp 98 F (36.7 C) (Oral)   Ht 5' 6.61" (1.692 m)   Wt 152 lb 3.2 oz (69 kg)   SpO2 100%   BMI 24.11 kg/m    Subjective:    Patient ID: Carl Allen, male    DOB: 1945/08/02, 75 y.o.   MRN: [id number]  Chief Complaint  Patient presents with  . New Patient (Initial Visit)    HPI: Carl Allen is a 75 y.o. male  Pt is here to establish care, pot has a ho htn/ COPD/ anemia  He has a ho prostate cancer - s/p brachytherapy seeds in 12/2017 no surg sees urology and rad onc for such,   Anemia Presents for follow-up (had low hb in the past x 10 yrs) visit. There has been no abdominal pain, anorexia, bruising/bleeding easily, confusion, fever, leg swelling, light-headedness, malaise/fatigue, pallor, palpitations, paresthesias, pica or weight loss.  Shortness of Breath Pertinent negatives include no abdominal pain or fever.    Chief Complaint  Patient presents with  . New Patient (Initial Visit)    Relevant past medical, surgical, family and social history reviewed and updated as indicated. Interim medical history since our last visit reviewed. Allergies and medications reviewed and updated.  Review of Systems  Constitutional: Negative for fever, malaise/fatigue and weight loss.  Respiratory: Positive for shortness of breath.   Cardiovascular: Negative for palpitations.  Gastrointestinal: Negative for abdominal pain and anorexia.  Skin: Negative for pallor.  Neurological: Negative for light-headedness and paresthesias.  Hematological: Does not bruise/bleed easily.  Psychiatric/Behavioral: Negative for confusion.    Per HPI unless specifically indicated above     Objective:    BP 126/79   Pulse (!) 59   Temp 98 F (36.7 C) (Oral)   Ht 5' 6.61" (1.692 m)   Wt 152 lb 3.2 oz (69 kg)   SpO2 100%   BMI 24.11 kg/m   Wt Readings from Last 3 Encounters:  11/04/20 152 lb 3.2 oz (69 kg)  09/18/20 151 lb 4.8 oz (68.6 kg)  08/27/20 153 lb  3.2 oz (69.5 kg)    Physical Exam Vitals and nursing note reviewed.  Constitutional:      General: He is not in acute distress.    Appearance: Normal appearance. He is not ill-appearing or diaphoretic.  HENT:     Head: Normocephalic and atraumatic.     Right Ear: Tympanic membrane and external ear normal. There is no impacted cerumen.     Left Ear: External ear normal.     Nose: No congestion or rhinorrhea.     Mouth/Throat:     Pharynx: No oropharyngeal exudate or posterior oropharyngeal erythema.  Eyes:     Conjunctiva/sclera: Conjunctivae normal.     Pupils: Pupils are equal, round, and reactive to light.  Cardiovascular:     Rate and Rhythm: Normal rate and regular rhythm.     Heart sounds: No murmur heard. No friction rub. No gallop.   Pulmonary:     Effort: No respiratory distress.     Breath sounds: No stridor. No wheezing or rhonchi.  Chest:     Chest wall: No tenderness.  Abdominal:     General: Abdomen is flat. Bowel sounds are normal. There is no distension.     Palpations: Abdomen is soft. There is no mass.     Tenderness: There is no abdominal tenderness. There is no guarding.  Musculoskeletal:  General: No swelling or deformity.     Cervical back: Normal range of motion and neck supple. No rigidity or tenderness.     Right lower leg: No edema.     Left lower leg: No edema.  Skin:    General: Skin is warm and dry.     Coloration: Skin is not jaundiced.     Findings: No erythema.  Neurological:     Mental Status: He is alert and oriented to person, place, and time. Mental status is at baseline.  Psychiatric:        Mood and Affect: Mood normal.        Behavior: Behavior normal.        Thought Content: Thought content normal.        Judgment: Judgment normal.     Results for orders placed or performed in visit on 08/27/20  PSA  Result Value Ref Range   Prostate Specific Ag, Serum 0.5 0.0 - 4.0 ng/mL  Bladder Scan (Post Void Residual) in office   Result Value Ref Range   Scan Result 13         Current Outpatient Medications:  .  amLODipine (NORVASC) 5 MG tablet, Take 1 tablet (5 mg total) by mouth daily., Disp: 90 tablet, Rfl: 1 .  benazepril (LOTENSIN) 40 MG tablet, Take 1 tablet (40 mg total) by mouth daily., Disp: 90 tablet, Rfl: 1 .  cetirizine (ZYRTEC) 10 MG tablet, Take 10 mg by mouth daily as needed for allergies., Disp: , Rfl:  .  fenofibrate micronized (LOFIBRA) 134 MG capsule, Take 1 capsule (134 mg total) by mouth daily before breakfast., Disp: 90 capsule, Rfl: 1 .  ferrous sulfate 325 (65 FE) MG tablet, Take 325 mg by mouth daily with breakfast., Disp: , Rfl:  .  hydrochlorothiazide (HYDRODIURIL) 25 MG tablet, Take 1 tablet (25 mg total) by mouth daily., Disp: 90 tablet, Rfl: 1 .  Multiple Vitamins-Minerals (CENTRUM SILVER 50+MEN) TABS, Take 1 tablet by mouth daily., Disp: , Rfl:  .  tiotropium (SPIRIVA HANDIHALER) 18 MCG inhalation capsule, Place 1 capsule (18 mcg total) into inhaler and inhale every morning., Disp: 90 capsule, Rfl: 1    Assessment & Plan:  1. HTN Is on norvasc, benazapril and hctz for such  Continue current meds.  Medication compliance emphasised. pt advised to keep Bp logs. Pt verbalised understanding of the same. Pt to have a low salt diet . Exercise to reach a goal of at least 150 mins a week.  lifestyle modifications explained and pt understands importance of the above.   2. HLD  Is on fenofibrate for such  recheck FLP, check LFT's work on diet, SE of meds explained to pt. low fat and high fiber diet explained to pt.  3. COPD  Stable, is on tiotropium for such    4. Anemia is on ferrous sulfate.  ? Cause >? If needs this still  consider iron studies, b12 , folate wnl, hemoccults wnl. pt deneis symptoms. adviced to contact./ call clinic if symptoms worsen.  5. prostate cancer - s/p brachytherapy seeds in 12/2017 no surg fu and mx per Urology and rad onc  Problem List Items Addressed This  Visit      Cardiovascular and Mediastinum   Essential hypertension     Respiratory   COPD (chronic obstructive pulmonary disease) ([place])     Other   Hypercholesteremia       Follow up plan: No follow-ups on file.  Health Maintenance :  Cscope : 2012Pneumonia vaccine : 2015 - prevanar  2009 ?2014 pneumovax   Smoking : still smokes 1 pdd. X 40 yrs.

## 2020-11-05 LAB — CBC WITH DIFFERENTIAL/PLATELET
Basophils Absolute: 0.1 10*3/uL (ref 0.0–0.2)
Basos: 1 %
EOS (ABSOLUTE): 0.1 10*3/uL (ref 0.0–0.4)
Eos: 1 %
Hematocrit: 43.2 % (ref 37.5–51.0)
Hemoglobin: 14.2 g/dL (ref 13.0–17.7)
Immature Grans (Abs): 0 10*3/uL (ref 0.0–0.1)
Immature Granulocytes: 1 %
Lymphocytes Absolute: 1.7 10*3/uL (ref 0.7–3.1)
Lymphs: 25 %
MCH: 31.1 pg (ref 26.6–33.0)
MCHC: 32.9 g/dL (ref 31.5–35.7)
MCV: 95 fL (ref 79–97)
Monocytes Absolute: 0.5 10*3/uL (ref 0.1–0.9)
Monocytes: 8 %
Neutrophils Absolute: 4.3 10*3/uL (ref 1.4–7.0)
Neutrophils: 64 %
Platelets: 180 10*3/uL (ref 150–450)
RBC: 4.57 x10E6/uL (ref 4.14–5.80)
RDW: 13.4 % (ref 11.6–15.4)
WBC: 6.7 10*3/uL (ref 3.4–10.8)

## 2020-11-05 LAB — LIPID PANEL
Chol/HDL Ratio: 6.7 ratio — ABNORMAL HIGH (ref 0.0–5.0)
Cholesterol, Total: 215 mg/dL — ABNORMAL HIGH (ref 100–199)
HDL: 32 mg/dL — ABNORMAL LOW (ref >39)
LDL Chol Calc (NIH): 141 mg/dL — ABNORMAL HIGH (ref 0–99)
Triglycerides: 232 mg/dL — ABNORMAL HIGH (ref 0–149)
VLDL Cholesterol Cal: 42 mg/dL — ABNORMAL HIGH (ref 5–40)

## 2020-11-05 LAB — COMPREHENSIVE METABOLIC PANEL
ALT: 8 IU/L (ref 0–44)
AST: 14 IU/L (ref 0–40)
Albumin/Globulin Ratio: 1.6 (ref 1.2–2.2)
Albumin: 4.4 g/dL (ref 3.7–4.7)
Alkaline Phosphatase: 61 IU/L (ref 44–121)
BUN/Creatinine Ratio: 18 (ref 10–24)
BUN: 16 mg/dL (ref 8–27)
Bilirubin Total: 0.5 mg/dL (ref 0.0–1.2)
CO2: 23 mmol/L (ref 20–29)
Calcium: 9.8 mg/dL (ref 8.6–10.2)
Chloride: 101 mmol/L (ref 96–106)
Creatinine, Ser: 0.88 mg/dL (ref 0.76–1.27)
Globulin, Total: 2.7 g/dL (ref 1.5–4.5)
Glucose: 86 mg/dL (ref 65–99)
Potassium: 4.3 mmol/L (ref 3.5–5.2)
Sodium: 140 mmol/L (ref 134–144)
Total Protein: 7.1 g/dL (ref 6.0–8.5)
eGFR: 90 mL/min/{1.73_m2} (ref >59)

## 2020-11-05 LAB — IRON AND TIBC
Iron Saturation: 22 % (ref 15–55)
Iron: 72 ug/dL (ref 38–169)
Total Iron Binding Capacity: 333 ug/dL (ref 250–450)
UIBC: 261 ug/dL (ref 111–343)

## 2020-11-05 LAB — FERRITIN: Ferritin: 494 ng/mL — ABNORMAL HIGH (ref 30–400)

## 2020-11-05 LAB — TSH: TSH: 2.83 u[IU]/mL (ref 0.450–4.500)

## 2020-11-21 ENCOUNTER — Telehealth: Payer: Self-pay | Admitting: *Deleted

## 2020-11-21 DIAGNOSIS — Z87891 Personal history of nicotine dependence: Secondary | ICD-10-CM

## 2020-11-21 DIAGNOSIS — F17200 Nicotine dependence, unspecified, uncomplicated: Secondary | ICD-10-CM

## 2020-11-21 DIAGNOSIS — Z122 Encounter for screening for malignant neoplasm of respiratory organs: Secondary | ICD-10-CM

## 2020-11-21 NOTE — Telephone Encounter (Addendum)
Received referral for low dose lung cancer screening CT scan. Message left at phone number listed in EMR for patient to call me back to facilitate scheduling scan.    Received referral for initial lung cancer screening scan. Contacted patient and obtained smoking history,(current (57) as well as answering questions related to screening process. Patient denies signs of lung cancer such as weight loss or hemoptysis. Patient denies comorbidity that would prevent curative treatment if lung cancer were found. Patient is scheduled for shared decision making visit and CT scan on 12/03/20 at 130pm.

## 2020-11-21 NOTE — Addendum Note (Signed)
Addended by: Lieutenant Diego on: 11/21/2020 12:41 PM   Modules accepted: Orders

## 2020-12-03 ENCOUNTER — Inpatient Hospital Stay: Payer: PPO | Attending: Nurse Practitioner | Admitting: Oncology

## 2020-12-03 ENCOUNTER — Other Ambulatory Visit: Payer: Self-pay

## 2020-12-03 ENCOUNTER — Ambulatory Visit
Admission: RE | Admit: 2020-12-03 | Discharge: 2020-12-03 | Disposition: A | Discharge status: Home / Self Care | Payer: PPO | Source: Ambulatory Visit | Attending: Nurse Practitioner | Admitting: Nurse Practitioner

## 2020-12-03 DIAGNOSIS — Z87891 Personal history of nicotine dependence: Secondary | ICD-10-CM | POA: Insufficient documentation

## 2020-12-03 DIAGNOSIS — Z122 Encounter for screening for malignant neoplasm of respiratory organs: Secondary | ICD-10-CM | POA: Insufficient documentation

## 2020-12-03 DIAGNOSIS — F17200 Nicotine dependence, unspecified, uncomplicated: Secondary | ICD-10-CM | POA: Diagnosis not present

## 2020-12-03 DIAGNOSIS — F17210 Nicotine dependence, cigarettes, uncomplicated: Secondary | ICD-10-CM | POA: Diagnosis not present

## 2020-12-03 NOTE — Progress Notes (Signed)
Virtual Visit via Video Note  I connected with Carl Allen on 12/03/20 at  1:45 PM EDT by a video enabled telemedicine application and verified that I am speaking with the correct person using two identifiers.  Location: Patient: [facility] Provider: Clinic    I discussed the limitations of evaluation and management by telemedicine and the availability of in person appointments. The patient expressed understanding and agreed to proceed.  I discussed the assessment and treatment plan with the patient. The patient was provided an opportunity to ask questions and all were answered. The patient agreed with the plan and demonstrated an understanding of the instructions.   The patient was advised to call back or seek an in-person evaluation if the symptoms worsen or if the condition fails to improve as anticipated.   In accordance with CMS guidelines, patient has met eligibility criteria including age, absence of signs or symptoms of lung cancer.  Social History   Tobacco Use   Smoking status: Every Day    Packs/day: 1.00    Years: 57.00    Pack years: 57.00    Types: Cigarettes   Smokeless tobacco: Never  Vaping Use   Vaping Use: Never used  Substance Use Topics   Alcohol use: Yes    Alcohol/week: 3.0 standard drinks    Types: 3 Shots of liquor per week    Comment: pt is currently trying to quit   Drug use: No      A shared decision-making session was conducted prior to the performance of CT scan. This includes one or more decision aids, includes benefits and harms of screening, follow-up diagnostic testing, over-diagnosis, false positive rate, and total radiation exposure.   Counseling on the importance of adherence to annual lung cancer LDCT screening, impact of co-morbidities, and ability or willingness to undergo diagnosis and treatment is imperative for compliance of the program.   Counseling on the importance of continued smoking cessation for former smokers; the importance of  smoking cessation for current smokers, and information about tobacco cessation interventions have been given to patient including [place] and [facility] quit [place] programs.   Written order for lung cancer screening with LDCT has been given to the patient and any and all questions have been answered to the best of my abilities.    Yearly follow up will be coordinated by Burgess Estelle, Thoracic Navigator.  I provided 15 minutes of face-to-face video visit time during this encounter, and > 50% was spent counseling as documented under my assessment & plan.   Jacquelin Hawking, NP

## 2020-12-04 ENCOUNTER — Ambulatory Visit (INDEPENDENT_AMBULATORY_CARE_PROVIDER_SITE_OTHER): Payer: PPO | Admitting: Internal Medicine

## 2020-12-04 ENCOUNTER — Encounter: Payer: Self-pay | Admitting: Internal Medicine

## 2020-12-04 VITALS — BP 126/81 | HR 69 | Temp 98.7°F | Ht 66.61 in | Wt 155.2 lb

## 2020-12-04 DIAGNOSIS — F17200 Nicotine dependence, unspecified, uncomplicated: Secondary | ICD-10-CM | POA: Diagnosis not present

## 2020-12-04 DIAGNOSIS — I2510 Atherosclerotic heart disease of native coronary artery without angina pectoris: Secondary | ICD-10-CM

## 2020-12-04 DIAGNOSIS — I719 Aortic aneurysm of unspecified site, without rupture: Secondary | ICD-10-CM | POA: Diagnosis not present

## 2020-12-04 DIAGNOSIS — E785 Hyperlipidemia, unspecified: Secondary | ICD-10-CM | POA: Diagnosis not present

## 2020-12-04 DIAGNOSIS — J449 Chronic obstructive pulmonary disease, unspecified: Secondary | ICD-10-CM

## 2020-12-04 DIAGNOSIS — I2584 Coronary atherosclerosis due to calcified coronary lesion: Secondary | ICD-10-CM | POA: Diagnosis not present

## 2020-12-04 MED ORDER — ATORVASTATIN CALCIUM 80 MG PO TABS: 80.0000 mg | ORAL_TABLET | Freq: Every day | ORAL | 3 refills | Status: DC

## 2020-12-04 MED ORDER — NICOTINE 21 MG/24HR TD PT24: 21.0000 mg | MEDICATED_PATCH | Freq: Every day | TRANSDERMAL | 0 refills | Status: DC

## 2020-12-04 NOTE — Progress Notes (Signed)
Pt to fu with me today, willl refer to Cardiothoracic surg given ascending aortic aneurysm

## 2020-12-04 NOTE — Progress Notes (Signed)
BP 126/81   Pulse 69   Temp 98.7 F (37.1 C) (Oral)   Ht 5' 6.61" (1.692 m)   Wt 155 lb 3.2 oz (70.4 kg)   SpO2 96%   BMI 24.59 kg/m    Subjective:    Patient ID: Carl Allen, male    DOB: 11-03-1945, 75 y.o.   MRN: [id number]  Chief Complaint  Patient presents with   Screening Lung Cancer    Had low dose CT for Screening lung cancer yesterday. Here to follow up on screening.    HPI: Carl Allen is a 75 y.o. male  Ascending aortic aneurysm found, on CT chest.  Pt sees Dr. Ubaldo Glassing for cards, he claims his brother also has the same aneurysm hes unsure which part tho. He denies CP/ No SOB. Dizziness    Chief Complaint  Patient presents with   Screening Lung Cancer    Had low dose CT for Screening lung cancer yesterday. Here to follow up on screening.    Relevant past medical, surgical, family and social history reviewed and updated as indicated. Interim medical history since our last visit reviewed. Allergies and medications reviewed and updated.  Review of Systems  Per HPI unless specifically indicated above     Objective:    BP 126/81   Pulse 69   Temp 98.7 F (37.1 C) (Oral)   Ht 5' 6.61" (1.692 m)   Wt 155 lb 3.2 oz (70.4 kg)   SpO2 96%   BMI 24.59 kg/m   Wt Readings from Last 3 Encounters:  12/04/20 155 lb 3.2 oz (70.4 kg)  12/03/20 152 lb (68.9 kg)  11/04/20 152 lb 3.2 oz (69 kg)    Physical Exam  Results for orders placed or performed in visit on 11/04/20  Microscopic Examination   Urine  Result Value Ref Range   WBC, UA None seen 0 - 5 /hpf   RBC 0-2 0 - 2 /hpf   Epithelial Cells (non renal) None seen 0 - 10 /hpf   Bacteria, UA None seen None seen/Few  TSH  Result Value Ref Range   TSH 2.830 0.450 - 4.500 uIU/mL  Lipid panel  Result Value Ref Range   Cholesterol, Total 215 (H) 100 - 199 mg/dL   Triglycerides 232 (H) 0 - 149 mg/dL   HDL 32 (L) >39 mg/dL   VLDL Cholesterol Cal 42 (H) 5 - 40 mg/dL   LDL Chol Calc (NIH) 141 (H) 0 - 99  mg/dL   Chol/HDL Ratio 6.7 (H) 0.0 - 5.0 ratio  CBC with Differential/Platelet  Result Value Ref Range   WBC 6.7 3.4 - 10.8 x10E3/uL   RBC 4.57 4.14 - 5.80 x10E6/uL   Hemoglobin 14.2 13.0 - 17.7 g/dL   Hematocrit 43.2 37.5 - 51.0 %   MCV 95 79 - 97 fL   MCH 31.1 26.6 - 33.0 pg   MCHC 32.9 31.5 - 35.7 g/dL   RDW 13.4 11.6 - 15.4 %   Platelets 180 150 - 450 x10E3/uL   Neutrophils 64 Not Estab. %   Lymphs 25 Not Estab. %   Monocytes 8 Not Estab. %   Eos 1 Not Estab. %   Basos 1 Not Estab. %   Neutrophils Absolute 4.3 1.4 - 7.0 x10E3/uL   Lymphocytes Absolute 1.7 0.7 - 3.1 x10E3/uL   Monocytes Absolute 0.5 0.1 - 0.9 x10E3/uL   EOS (ABSOLUTE) 0.1 0.0 - 0.4 x10E3/uL   Basophils Absolute 0.1 0.0 - 0.2 x10E3/uL  Immature Granulocytes 1 Not Estab. %   Immature Grans (Abs) 0.0 0.0 - 0.1 x10E3/uL  Comprehensive metabolic panel  Result Value Ref Range   Glucose 86 65 - 99 mg/dL   BUN 16 8 - 27 mg/dL   Creatinine, Ser 0.88 0.76 - 1.27 mg/dL   eGFR 90 >59 mL/min/1.73   BUN/Creatinine Ratio 18 10 - 24   Sodium 140 134 - 144 mmol/L   Potassium 4.3 3.5 - 5.2 mmol/L   Chloride 101 96 - 106 mmol/L   CO2 23 20 - 29 mmol/L   Calcium 9.8 8.6 - 10.2 mg/dL   Total Protein 7.1 6.0 - 8.5 g/dL   Albumin 4.4 3.7 - 4.7 g/dL   Globulin, Total 2.7 1.5 - 4.5 g/dL   Albumin/Globulin Ratio 1.6 1.2 - 2.2   Bilirubin Total 0.5 0.0 - 1.2 mg/dL   Alkaline Phosphatase 61 44 - 121 IU/L   AST 14 0 - 40 IU/L   ALT 8 0 - 44 IU/L  Urinalysis, Routine w reflex microscopic  Result Value Ref Range   Specific Gravity, UA <1.005 (L) 1.005 - 1.030   pH, UA 6.0 5.0 - 7.5   Color, UA Yellow Yellow   Appearance Ur Clear Clear   Leukocytes,UA Negative Negative   Protein,UA Negative Negative/Trace   Glucose, UA Negative Negative   Ketones, UA Negative Negative   RBC, UA Trace (A) Negative   Bilirubin, UA Negative Negative   Urobilinogen, Ur 0.2 0.2 - 1.0 mg/dL   Nitrite, UA Negative Negative   Microscopic  Examination See below:   Ferritin  Result Value Ref Range   Ferritin 494 (H) 30 - 400 ng/mL  Iron and TIBC  Result Value Ref Range   Total Iron Binding Capacity 333 250 - 450 ug/dL   UIBC 261 111 - 343 ug/dL   Iron 72 38 - 169 ug/dL   Iron Saturation 22 15 - 55 %        Current Outpatient Medications:    amLODipine (NORVASC) 5 MG tablet, Take 1 tablet (5 mg total) by mouth daily., Disp: 90 tablet, Rfl: 1   benazepril (LOTENSIN) 40 MG tablet, Take 1 tablet (40 mg total) by mouth daily., Disp: 90 tablet, Rfl: 1   cetirizine (ZYRTEC) 10 MG tablet, Take 10 mg by mouth daily as needed for allergies., Disp: , Rfl:    fenofibrate micronized (LOFIBRA) 134 MG capsule, Take 1 capsule (134 mg total) by mouth daily before breakfast., Disp: 90 capsule, Rfl: 1   ferrous sulfate 325 (65 FE) MG tablet, Take 325 mg by mouth daily with breakfast., Disp: , Rfl:    hydrochlorothiazide (HYDRODIURIL) 25 MG tablet, Take 1 tablet (25 mg total) by mouth daily., Disp: 90 tablet, Rfl: 1   Multiple Vitamins-Minerals (CENTRUM SILVER 50+MEN) TABS, Take 1 tablet by mouth daily., Disp: , Rfl:    tiotropium (SPIRIVA HANDIHALER) 18 MCG inhalation capsule, Place 1 capsule (18 mcg total) into inhaler and inhale every morning., Disp: 90 capsule, Rfl: 1  Ct low dose :  1. Lung-RADS 2, benign appearance or behavior. Continue annual screening with low-dose chest CT without contrast in 12 months. 2. Ascending aortic aneurysm. Ascending thoracic aortic aneurysm. Recommend semi-annual imaging followup by CTA or MRA and referral to cardiothoracic surgery if not already obtained. This recommendation follows 2010 ACCF/AHA/AATS/ACR/ASA/SCA/SCAI/SIR/STS/SVM Guidelines for the Diagnosis and Management of Patients With Thoracic Aortic Disease. Circulation. 2010; 121: R678-L381. Aortic aneurysm NOS (ICD10-I71.9). 3. Aortic atherosclerosis (ICD10-I70.0). Coronary artery calcification. 4.  Emphysema (ICD10-J43.9).   Assessment &  Plan:  Aortic aneurysm  Fu with cardiothoracic surgeons.   Smoking cessation  Smoking cessation advised.  failed nicotine patches in the past. continues to smoke. more than > 5 - 10 mins of time was spent with pt regarding smoking cessation and complications.  Is on norvasc, benazapril and hctz for such  Continue current meds.  Medication compliance emphasised. pt advised to keep Bp logs. Pt verbalised understanding of the same. Pt to have a low salt diet . Exercise to reach a goal of at least 150 mins a week.  lifestyle modifications explained and pt understands importance of the above.     HLD Is on fenofibrate for such  recheck FLP, check LFT's work on diet, SE of meds explained to pt. low fat and high fiber diet explained to pt.   COPD Stable, is on tiotropium for such   Anemia is on ferrous sulfate.  ? Cause >? If needs this still consider iron studies, b12 , folate wnl, hemoccults wnl. pt deneis symptoms. adviced to contact./ call clinic if symptoms worsen.   prostate cancer - s/p brachytherapy seeds in 12/2017 no surg fu and mx per Urology and rad onc   Has coronary art calcifications to fu with cardiology asap as wlel.   HLD will start pt on lipitor for such  recheck FLP, check LFT's work on diet, SE of meds explained to pt. low fat and high fiber diet explained to pt.   Ref. Range 11/04/2020 09:41  Total CHOL/HDL Ratio Latest Ref Range: 0.0 - 5.0 ratio 6.7 (H)  Cholesterol, Total Latest Ref Range: 100 - 199 mg/dL 215 (H)  HDL Cholesterol Latest Ref Range: >39 mg/dL 32 (L)  Triglycerides Latest Ref Range: 0 - 149 mg/dL 232 (H)  VLDL Cholesterol Cal Latest Ref Range: 5 - 40 mg/dL 42 (H)  LDL Chol Calc (NIH) Latest Ref Range: 0 - 99 mg/dL 141 (H)    Problem List Items Addressed This Visit   None    No orders of the defined types were placed in this encounter.    Meds ordered this encounter  Medications   nicotine (NICODERM CQ) 21 mg/24hr patch    Sig: Place 1 patch  (21 mg total) onto the skin daily.    Dispense:  28 patch    Refill:  0   atorvastatin (LIPITOR) 80 MG tablet    Sig: Take 1 tablet (80 mg total) by mouth daily.    Dispense:  90 tablet    Refill:  3     Follow up plan: No follow-ups on file.

## 2020-12-17 ENCOUNTER — Encounter: Payer: Self-pay | Admitting: *Deleted

## 2020-12-17 DIAGNOSIS — J42 Unspecified chronic bronchitis: Secondary | ICD-10-CM | POA: Diagnosis not present

## 2020-12-17 DIAGNOSIS — I10 Essential (primary) hypertension: Secondary | ICD-10-CM | POA: Diagnosis not present

## 2020-12-17 DIAGNOSIS — I480 Paroxysmal atrial fibrillation: Secondary | ICD-10-CM | POA: Diagnosis not present

## 2020-12-17 DIAGNOSIS — E785 Hyperlipidemia, unspecified: Secondary | ICD-10-CM | POA: Diagnosis not present

## 2020-12-17 DIAGNOSIS — I712 Thoracic aortic aneurysm, without rupture: Secondary | ICD-10-CM | POA: Diagnosis not present

## 2020-12-20 ENCOUNTER — Ambulatory Visit (INDEPENDENT_AMBULATORY_CARE_PROVIDER_SITE_OTHER): Payer: PPO | Admitting: General Practice

## 2020-12-20 ENCOUNTER — Telehealth: Payer: Self-pay | Admitting: General Practice

## 2020-12-20 DIAGNOSIS — J449 Chronic obstructive pulmonary disease, unspecified: Secondary | ICD-10-CM

## 2020-12-20 DIAGNOSIS — J410 Simple chronic bronchitis: Secondary | ICD-10-CM

## 2020-12-20 DIAGNOSIS — E7800 Pure hypercholesterolemia, unspecified: Secondary | ICD-10-CM | POA: Diagnosis not present

## 2020-12-20 DIAGNOSIS — E785 Hyperlipidemia, unspecified: Secondary | ICD-10-CM | POA: Diagnosis not present

## 2020-12-20 DIAGNOSIS — F419 Anxiety disorder, unspecified: Secondary | ICD-10-CM

## 2020-12-20 DIAGNOSIS — I10 Essential (primary) hypertension: Secondary | ICD-10-CM | POA: Diagnosis not present

## 2020-12-20 DIAGNOSIS — F17200 Nicotine dependence, unspecified, uncomplicated: Secondary | ICD-10-CM

## 2020-12-20 DIAGNOSIS — F1020 Alcohol dependence, uncomplicated: Secondary | ICD-10-CM

## 2020-12-20 DIAGNOSIS — I719 Aortic aneurysm of unspecified site, without rupture: Secondary | ICD-10-CM

## 2020-12-20 NOTE — Chronic Care Management (AMB) (Signed)
Chronic Care Management   CCM RN Visit Note  12/20/2020 Name: CAVON NICOLLS MRN: [id number] DOB: 09/03/1945  Subjective: TEIGEN BELLIN is a 75 y.o. year old male who is a primary care patient of [name], [name], MD. The care management team was consulted for assistance with disease management and care coordination needs.    Engaged with patient by telephone for follow up visit in response to provider referral for case management and/or care coordination services.   Consent to Services:  The patient was given information about Chronic Care Management services, agreed to services, and gave verbal consent prior to initiation of services.  Please see initial visit note for detailed documentation.   Patient agreed to services and verbal consent obtained.   Assessment: Review of patient past medical history, allergies, medications, health status, including review of consultants reports, laboratory and other test data, was performed as part of comprehensive evaluation and provision of chronic care management services.   SDOH (Social Determinants of Health) assessments and interventions performed:    CCM Care Plan  No Known Allergies  Outpatient Encounter Medications as of 12/20/2020  Medication Sig   amLODipine (NORVASC) 5 MG tablet Take 1 tablet (5 mg total) by mouth daily.   atorvastatin (LIPITOR) 80 MG tablet Take 1 tablet (80 mg total) by mouth daily.   benazepril (LOTENSIN) 40 MG tablet Take 1 tablet (40 mg total) by mouth daily.   cetirizine (ZYRTEC) 10 MG tablet Take 10 mg by mouth daily as needed for allergies.   fenofibrate micronized (LOFIBRA) 134 MG capsule Take 1 capsule (134 mg total) by mouth daily before breakfast.   ferrous sulfate 325 (65 FE) MG tablet Take 325 mg by mouth daily with breakfast.   hydrochlorothiazide (HYDRODIURIL) 25 MG tablet Take 1 tablet (25 mg total) by mouth daily.   Multiple Vitamins-Minerals (CENTRUM SILVER 50+MEN) TABS Take 1 tablet by mouth daily.    nicotine (NICODERM CQ) 21 mg/24hr patch Place 1 patch (21 mg total) onto the skin daily.   tiotropium (SPIRIVA HANDIHALER) 18 MCG inhalation capsule Place 1 capsule (18 mcg total) into inhaler and inhale every morning.   No facility-administered encounter medications on file as of 12/20/2020.    Patient Active Problem List   Diagnosis Date Noted   Prostate cancer ([place]) 08/26/2017   Advanced care planning/counseling discussion 01/12/2017   Chronic alcoholism ([facility]) 07/14/2016   Eczema 01/08/2016   COPD (chronic obstructive pulmonary disease) ([place]) 01/07/2015   Hypercholesteremia 01/07/2015   Essential hypertension 11/09/2014   Alcohol abuse counseling and surveillance 11/09/2014   History of atrial fibrillation 10/12/2013    Conditions to be addressed/monitored:HTN, HLD, COPD, Anxiety, and AAA, smoker, and ETOH use  Care Plan : RNCM: Management of HLD  Updates made by Vanita Ingles since 12/20/2020 12:00 AM     Problem: RNCM: Management of HLD   Priority: Medium     Long-Range Goal: RNCM: Management of HLD   Start Date: 07/23/2020  Expected End Date: 12/15/2021  This Visit's Progress: On track  Priority: High  Note:   Current Barriers:  Poorly controlled hyperlipidemia, complicated by HTN, smoker, ETOH use Current antihyperlipidemic regimen: fenofibrate 134 mg daily, atorvastatin 80 mg QD- started at last MD visit on 12-04-2020 Most recent lipid panel:  Lab Results  Component Value Date   CHOL 215 (H) 11/04/2020   CHOL 209 (H) 03/12/2020   CHOL 209 (H) 09/04/2019   Lab Results  Component Value Date   HDL 32 (L) 11/04/2020  HDL 29 (L) 03/12/2020   HDL 33 (L) 09/04/2019   Lab Results  Component Value Date   LDLCALC 141 (H) 11/04/2020   LDLCALC 107 (H) 03/12/2020   LDLCALC 123 (H) 09/04/2019   Lab Results  Component Value Date   TRIG 232 (H) 11/04/2020   TRIG 425 (H) 03/12/2020   TRIG 297 (H) 09/04/2019   Lab Results  Component Value Date   CHOLHDL 6.7 (H)  11/04/2020   CHOLHDL 5.8 (H) 01/12/2017   CHOLHDL 7.2 (H) 01/07/2015   No results found for: LDLDIRECT  ASCVD risk enhancing conditions: age >56,  HTN, current smoker, Ascending aortic aneurysm  Lacks social connections Does not contact provider office for questions/concerns  RN Care Manager Clinical Goal(s):  Over the next 240 days, patient will work with Consulting civil engineer, providers, and care team towards execution of optimized self-health management plan patient will verbalize understanding of plan for effective management of HLD  patient will work with [facility] and pcp to address needs related to management of HLD  patient will attend all scheduled medical appointments: pcp on 02-04-2021 at 10 am   Interventions: Collaboration with Charlynne Cousins, MD regarding development and update of comprehensive plan of care as evidenced by provider attestation and co-signature Inter-disciplinary care team collaboration (see longitudinal plan of care) Medication review performed; medication list updated in electronic medical record.  Inter-disciplinary care team collaboration (see longitudinal plan of care) Referred to pharmacy team for assistance with HLD medication management Evaluation of current treatment plan related to HLD and patient's adherence to plan as established by provider. 10-18-2020: The patient is doing well and is actually eating healthier than normal. States he is cutting down on his alcohol use and praised the patient for this accomplishment. He wants to maintain his health and well being. Has lost weight and is now down to 151/152 pounds. 12-20-2020: The patient states that he has stopped drinking completely. He read up on the cholesterol medications and decided that it was time to leave it alone completely. He has patches to use for smoking cessation but has not started using them. Education on small and steady steps to quit smoking. He says he is tired since taking the cholesterol medications  but he is not having any muscule pain. Education and support given. Will continue to monitor.  Advised patient to to call the office for changes in condition or questions. 10-18-2020: Encouraged the patient to write down questions to ask the pcp on his upcoming visit on 11-04-2020. 12-20-2020: Extensive education today on health and well being. The patient is anxious about new findings with AAA but he is confident in his plan of care and health team. Sees specialist on 01-09-2021 and pcp again on 02-04-2021 Provided education to patient re: heart healthy diet, portion sizes, managing weight and stress levels. 10-18-2020: The patient received his information in the mail and is appreciative. The patient states he has been reading up on the information. Also had follow up for his prostate cancer and states that he is "cancer free" or in "remission".  Reviewed medications with patient and discussed compliance. 12-20-2020: The patient is compliant with medications and heart healthy diet. Making positive changes with ETOH use and smoking.  Provided patient with heart healthy diet  educational materials related to effective management of HLD and other cardiac conditions. 12-20-2020: Review of education and new concerns. The patient states he is optimistic and confident in his health care team.  Discussed plans with patient for ongoing care  management follow up and provided patient with direct contact information for care management team   Patient Goals/Self-Care Activities: Over the next 120 days, patient will:   - call for medicine refill 2 or 3 days before it runs out - call if I am sick and can't take my medicine - keep a list of all the medicines I take; vitamins and herbals too - learn to read medicine labels - use a pillbox to sort medicine - use an alarm clock or phone to remind me to take my medicine - change to whole grain breads, cereal, pasta - drink 6 to 8 glasses of water each day - eat 3 to 5 servings  of fruits and vegetables each day - eat 5 or 6 small meals each day - fill half the plate with nonstarchy vegetables - limit fast food meals to no more than 1 per week - manage portion size - prepare main meal at home 3 to 5 days each week - be open to making changes - I can manage, know and watch for signs of a heart attack - if I have chest pain, call for help - learn about small changes that will make a big difference - learn my personal risk factors - barriers to meeting goals identified - change-talk evoked - choices provided - collaboration with team encouraged - decision-making supported - difficulty of making life-long changes acknowledged - health risks reviewed - problem-solving facilitated - questions answered - readiness for change evaluated - reassurance provided - self-reflection promoted - self-reliance encouraged - verbalization of feelings encouraged  Follow Up Plan: Telephone follow up appointment with care management team member scheduled for: 02-05-2021 at 945 am      Task: RNCM: Management of HLD Completed 12/20/2020  Outcome: Positive  Note:   Care Management Activities:    - barriers to meeting goals identified - change-talk evoked - choices provided - collaboration with team encouraged - decision-making supported - difficulty of making life-long changes acknowledged - health risks reviewed - problem-solving facilitated - questions answered - readiness for change evaluated - reassurance provided - self-reflection promoted - self-reliance encouraged - verbalization of feelings encouraged        Care Plan : RNCM: Hypertension (Adult)  Updates made by Vanita Ingles since 12/20/2020 12:00 AM     Problem: RMCM: Hypertension (Hypertension)   Priority: Medium     Long-Range Goal: RNCM: Hypertension Monitored   Start Date: 07/23/2020  Expected End Date: 12/15/2021  This Visit's Progress: On track  Priority: High  Note:   Objective:  Last  practice recorded BP readings:  BP Readings from Last 3 Encounters:  12/04/20 126/81  11/04/20 126/79  09/18/20 134/82   Most recent eGFR/CrCl:  Lab Results  Component Value Date   EGFR 90 11/04/2020    No components found for: CRCL Current Barriers:  Knowledge Deficits related to basic understanding of hypertension pathophysiology and self care management Knowledge Deficits related to understanding of medications prescribed for management of hypertension Limited Social Support Unable to independently HTN Case Manager Clinical Goal(s):  patient will verbalize understanding of plan for hypertension management patient will attend all scheduled medical appointments: 02-04-2021 at 10 am patient will demonstrate improved adherence to prescribed treatment plan for hypertension as evidenced by taking all medications as prescribed, monitoring and recording blood pressure as directed, adhering to low sodium/DASH diet patient will demonstrate improved health management independence as evidenced by checking blood pressure as directed and notifying PCP if SBP>160 or DBP >  90, taking all medications as prescribe, and adhering to a low sodium diet as discussed. patient will verbalize basic understanding of hypertension disease process and self health management plan as evidenced by compliance with heart healthy diet, compliance with medications and working with the CCM team to effectively manage health and well being Interventions:  Collaboration with [name], [name], MD regarding development and update of comprehensive plan of care as evidenced by provider attestation and co-signature Inter-disciplinary care team collaboration (see longitudinal plan of care) Evaluation of current treatment plan related to hypertension self management and patient's adherence to plan as established by provider. Provided education to patient re: stroke prevention, s/s of heart attack and stroke, DASH diet, complications of  uncontrolled blood pressure Reviewed medications with patient and discussed importance of compliance Discussed plans with patient for ongoing care management follow up and provided patient with direct contact information for care management team Advised patient, providing education and rationale, to monitor blood pressure daily and record, calling PCP for findings outside established parameters.  Reviewed scheduled/upcoming provider appointments including: 02-04-2021 at 10 am Self-Care Activities: - Self administers medications as prescribed Attends all scheduled provider appointments Calls provider office for new concerns, questions, or BP outside discussed parameters Checks BP and records as discussed Follows a low sodium diet/DASH diet Patient Goals: - check blood pressure 3 times per week - choose a place to take my blood pressure (home, clinic or office, retail store) - write blood pressure results in a log or diary - agree on reward when goals are met - agree to work together to make changes - ask questions to understand - have a family meeting to talk about healthy habits - learn about high blood pressure  Follow Up Plan: Telephone follow up appointment with care management team member scheduled for: 02-05-2021 at 0945 am    Task: RNCM: Identify and Monitor Blood Pressure Elevation Completed 12/20/2020  Outcome: Positive  Note:   Care Management Activities:    - blood pressure trends reviewed - depression screen reviewed - home or ambulatory blood pressure monitoring encouraged        Care Plan : RNCM: COPD (Adult)  Updates made by Vanita Ingles since 12/20/2020 12:00 AM     Problem: RNCM: Psychological Adjustment to Diagnosis (COPD)   Priority: Medium     Long-Range Goal: RNCM: Adjustment to Disease Achieved   Start Date: 07/23/2020  Expected End Date: 12/15/2021  This Visit's Progress: On track  Priority: Medium  Note:   Current Barriers:  Knowledge deficits  related to basic understanding of COPD disease process Knowledge deficits related to basic COPD self care/management Knowledge deficit related to basic understanding of how to use inhalers and how inhaled medications work Knowledge deficit related to importance of energy conservation Unable to independently manage COPD Lacks social connections  Case Manager Clinical Goal(s): patient will report using inhalers as prescribed including rinsing mouth after use patient will report utilizing pursed lip breathing for shortness of breath patient will verbalize understanding of COPD action plan and when to seek appropriate levels of medical care patient will engage in lite exercise as tolerated to build/regain stamina and strength and reduce shortness of breath through activity tolerance patient will verbalize basic understanding of COPD disease process and self care activities patient will not be hospitalized for COPD exacerbation as evidenced  Interventions:  Collaboration with [name], [name], MD regarding development and update of comprehensive plan of care as evidenced by provider attestation and co-signature Inter-disciplinary care team collaboration (  see longitudinal plan of care) Provided patient with basic written and verbal COPD education on self care/management/and exacerbation prevention  Provided patient with COPD action plan and reinforced importance of daily self assessment Provided written and verbal instructions on pursed lip breathing and utilized returned demonstration as teach back Provided instruction about proper use of medications used for management of COPD including inhalers Advised patient to self assesses COPD action plan zone and make appointment with provider if in the yellow zone for 48 hours without improvement. Provided patient with education about the role of exercise in the management of COPD Advised patient to engage in light exercise as tolerated 3-5 days a  week Provided education about and advised patient to utilize infection prevention strategies to reduce risk of respiratory infection  Self-Care Activities:  Patient verbalizes understanding of plan to effectively manage COPD Self administers medications as prescribed Attends all scheduled provider appointments Calls pharmacy for medication refills Attends church or other social activities Performs ADL's independently Performs IADL's independently Calls provider office for new concerns or questions Patient Goals: - do breathing exercises at least 2 times each day - do exercises in a comfortable position that makes breathing as easy as possible - develop a new routine to improve sleep - don't eat or exercise right before bedtime - eat healthy - get at least 7 to 8 hours of sleep at night - get outdoors every day (weather permitting) - keep room cool and dark - limit daytime naps - practice relaxation or meditation daily - use a fan or white noise in bedroom - use devices that will help like a cane, sock-puller or reacher - begin a symptom diary - bring symptom diary to all visits - develop a rescue plan - eliminate symptom triggers at home - follow rescue plan if symptoms flare-up - keep follow-up appointments - use an extra pillow to sleep - avoid second hand smoke - eliminate smoking in my home - identify and avoid work-related triggers - identify and remove indoor air pollutants - limit outdoor activity during cold weather - listen for public air quality announcements every day Follow Up Plan: Telephone follow up appointment with care management team member scheduled for: 02-05-2021 at 0945 am    Task: RNCM: Support Psychosocial Response to Chronic Obstructive Pulmonary Disease Completed 12/20/2020  Outcome: Positive  Note:   Care Management Activities:    - decision-making supported - depression screen reviewed - emotional support provided - problem-solving  facilitated - relaxation techniques promoted - verbalization of feelings encouraged        Problem: RNCM: Smoking Education and support   Priority: High     Long-Range Goal: RNCM: Smoking Cessation education and support   Start Date: 07/23/2020  Expected End Date: 12/15/2021  This Visit's Progress: On track  Priority: High  Note:   Current Barriers:  Unable to independently stop smoking  Lacks social connections Does not contact provider office for questions/concerns 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 years years; currently smoking 3/4 ppd Previous quit attempts, unsuccessful not really ever tried successful using none Reports smoking within 30 minutes of waking up Reports triggers to smoke include: stress, changes in social environment  Reports motivation to quit smoking includes: knows this would be better for his health and well bein g On a scale of 1-10, reports MOTIVATION to quit is 7 On a scale of 1-10, reports CONFIDENCE in quitting is 5 Clinical Goal(s):  Over the next 120 days, patient will work with Chief Strategy Officer and  provider towards tobacco cessation  Interventions: Collaboration with [name], [name], MD regarding development and update of comprehensive plan of care as evidenced by provider attestation and co-signature Inter-disciplinary care team collaboration (see longitudinal plan of care) Evaluation of current treatment plan reviewed. The patient states he is cutting back. Was smoking a pack and a half, is now down to 3/4 a pack a day. Also is working on decrease of ETOH consumption. States today (07-23-2020) that he has not had anything to drink in 3 weeks. 10-18-2020: The patient states that he is doing well. He states he has cut back on his drinking.. Praised for great progress. 12-20-2020: The patient states he has stopped drinking x 2 weeks and has decided he does not need any more ETOH. He has patches for smoking cessation but admits he has not started using them yet. Education on  slow and steady and working toward quitting as his goal. The patient praised for positive changes. He has new health concerns and he wants to stop smoking as well. Education and support given.  Provided contact information for [place] [facility] (1-800-QUIT-NOW). Patient knows this is a resource available  Discussed plans with patient for ongoing care management follow up and provided patient with direct contact information for care management team Provided patient with printed smoking cessation educational materials Reviewed scheduled/upcoming provider appointments including: 02-04-2021 at 1000 am Evaluation of current treatment plan reviewed Patient Goals/Self-Care Activities Over the next 120 days, patient will:  - alternative activities  as a habit during cravings  - verbally commit to reducing tobacco consumption Follow Up Plan: Telephone follow up appointment with care management team member scheduled for: 02-05-2021 at 945 am    Task: RNCM: Smoking Cessation Completed 12/20/2020  Outcome: Positive  Note:   Care Management Activities:    - barriers to lifestyle changes reviewed and addressed - barriers to treatment reviewed and addressed - breathing techniques encouraged - healthy lifestyle promoted - modification of home and work environment promoted - rescue (action) plan reviewed - signs/symptoms of infection reviewed - signs/symptoms of worsening disease assessed - symptom triggers identified - treatment plan reviewed        Care Plan : RNCM: Management of AAA ascending aortic aneurysm  Updates made by Vanita Ingles since 12/20/2020 12:00 AM     Problem: RNCM: Management of AAA   Priority: High  Onset Date: 12/20/2020     Long-Range Goal: RNCM: Management of AAA   Start Date: 12/20/2020  Expected End Date: 07/08/2021  This Visit's Progress: On track  Priority: High  Note:   Current Barriers:  Ineffective Self Health Maintenance in a patient with AAA- ascending aortic  aneurysm  Unable to independently effectively manage AAA- ascending aortic aneurysm  Lacks social connections Clinical Goal(s):  Collaboration with [name], [name], MD regarding development and update of comprehensive plan of care as evidenced by provider attestation and co-signature Inter-disciplinary care team collaboration (see longitudinal plan of care) patient will work with care management team to address care coordination and chronic disease management needs related to Disease Management Educational Needs Care Coordination Mental Health Counseling Substance Abuse Counseling Level of Care Concerns   Interventions:  Evaluation of current treatment plan related to AAA- ascending aortic aneurysm , Level of care concerns and Mental Health Concerns  self-management and patient's adherence to plan as established by provider. Collaboration with Charlynne Cousins, MD regarding development and update of comprehensive plan of care as evidenced by provider attestation  and co-signature Inter-disciplinary care team collaboration (see longitudinal plan of care) Discussed plans with patient for ongoing care management follow up and provided patient with direct contact information for care management team Self Care Activities:  Patient verbalizes understanding of plan to effectively manage AAA- ascending aortic aneurysm  Self administers medications as prescribed Attends all scheduled provider appointments Calls pharmacy for medication refills Attends church or other social activities Performs ADL's independently Performs IADL's independently Calls provider office for new concerns or questions Patient Goals: Work with the [employer] concerning new anxiety of AAA Keep appointments Call office for changes on new concerns Take medications as prescribed Have plan of care in place for AAA of 4.8 Refrain from ETOH use Stop smoking Work with the CCM team to optimize health and well being.  Follow Up Plan:  Telephone follow up appointment with care management team member scheduled for: 02-05-2021 at 0945 am     Plan:Telephone follow up appointment with care management team member scheduled for:  02-05-2021 at 67 am  Noreene Larsson RN, MSN, [facility] Mobile: [phone]

## 2020-12-20 NOTE — Patient Instructions (Signed)
Visit Information  PATIENT GOALS:  Goals Addressed             This Visit's Progress    RNCM: Anxiety about aneursym       Timeframe:  Short-Term Goal Priority:  High Start Date:     12-20-2020                        Expected End Date:      05-20-2022                 Follow Up Date 02/05/2021    - avoid negative self-talk - develop a personal safety plan - develop a plan to deal with triggers like holidays, anniversaries - exercise at least 2 to 3 times per week - have a plan for how to handle bad days - journal feelings and what helps to feel better or worse - spend time or talk with others at least 2 to 3 times per week - spend time or talk with others every day - watch for early signs of feeling worse - write in journal every day    Why is this important?   Keeping track of your progress will help your treatment team find the right mix of medicine and therapy for you.  Write in your journal every day.  Day-to-day changes in depression symptoms are normal. It may be more helpful to check your progress at the end of each week instead of every day.     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 He is staying optimistic. Has stopped drinking and working on smoking cessation.         The patient verbalized understanding of instructions, educational materials, and care plan provided today and declined offer to receive copy of patient instructions, educational materials, and care plan.   Telephone follow up appointment with care management team member scheduled for:02-05-2021 at 59 am  [place], MSN, [facility] Mobile: [phone]

## 2020-12-23 ENCOUNTER — Telehealth: Payer: Self-pay

## 2020-12-23 NOTE — Progress Notes (Signed)
Patient has been scheduled

## 2020-12-23 NOTE — Chronic Care Management (AMB) (Signed)
  Chronic Care Management   Note  12/23/2020 Name: Carl Allen MRN: [id number] DOB: 06-Mar-1946  Carl Allen is a 75 y.o. year old male who is a primary care patient of [name], [name], MD. Carl Allen is currently enrolled in care management services. An additional referral for LCSW was placed.   Follow up plan: Telephone appointment with care management team member scheduled for:12/26/2020  Noreene Larsson, [place], [place], [place] [address] Direct Dial: [phone] [email or web address] Website: [email or web address]

## 2020-12-26 ENCOUNTER — Ambulatory Visit: Payer: PPO | Admitting: Licensed Clinical Social Worker

## 2020-12-26 DIAGNOSIS — I10 Essential (primary) hypertension: Secondary | ICD-10-CM

## 2020-12-26 DIAGNOSIS — J410 Simple chronic bronchitis: Secondary | ICD-10-CM

## 2020-12-26 DIAGNOSIS — F419 Anxiety disorder, unspecified: Secondary | ICD-10-CM

## 2020-12-26 DIAGNOSIS — F1020 Alcohol dependence, uncomplicated: Secondary | ICD-10-CM

## 2020-12-26 NOTE — Patient Instructions (Signed)
Visit Information   Goals Addressed             This Visit's Progress    Manage Stress regarding health   On track    Timeframe:  Long-Range Goal Priority:  High Start Date:     12/26/20                        Expected End Date:  02/28/21                     Follow Up Date 02/24/21    Patient Goals/Self-Care Activities: Over the next 120 days Attend all scheduled medical appointments Contact clinic with any questions or concerns Utilize strategies discussed to assist with stress management        Patient verbalizes understanding of instructions provided today.   Telephone follow up appointment with care management team member scheduled for:02/24/21  Christa See, MSW, [email or web address] Phone [phone] 2:43 PM

## 2020-12-26 NOTE — Chronic Care Management (AMB) (Signed)
Chronic Care Management    Clinical Social Work Note  12/26/2020 Name: Carl Allen MRN: [id number] DOB: [birth year]  Carl Allen is a 75 y.o. year old male who is a primary care patient of [name], [name], MD. The CCM team was consulted to assist the patient with chronic disease management and/or care coordination needs related to: Level of Care Concerns and Mental Health Counseling and Resources.   Engaged with patient by telephone for initial visit in response to provider referral for social work chronic care management and care coordination services.   Consent to Services:  The patient was given the following information about Chronic Care Management services today, agreed to services, and gave verbal consent: 1. CCM service includes personalized support from designated clinical staff supervised by the primary care provider, including individualized plan of care and coordination with other care providers 2. 24/7 contact phone numbers for assistance for urgent and routine care needs. 3. Service will only be billed when office clinical staff spend 20 minutes or more in a month to coordinate care. 4. Only one practitioner may furnish and bill the service in a calendar month. 5.The patient may stop CCM services at any time (effective at the end of the month) by phone call to the office staff. 6. The patient will be responsible for cost sharing (co-pay) of up to 20% of the service fee (after annual deductible is met). Patient agreed to services and consent obtained.  Patient agreed to services and consent obtained.   Assessment: Patient is engaged in conversation. He reports feelings of anxiety regarding recent diagnosis of AAA. Patient receives strong support and was successful in identifying healthy coping skills. He maintains sobriety with alcohol and is trying to decrease tobacco use. See Care Plan below for interventions and patient self-care actives.  Recent life changes [place]:  Management of health conditions  Recommendation: Patient may benefit from, and is in agreement to work with LCSW to address care coordination needs and will continue to work with the clinical team to address health care and disease management related needs.   Follow up Plan: Patient would like continued follow-up from CCM LCSW .  Follow up scheduled in 02/24/21. Patient will call office if needed prior to next encounter.   SDOH (Social Determinants of Health) assessments and interventions performed:  SDOH Interventions    Flowsheet Row Most Recent Value  SDOH Interventions   Food Insecurity Interventions Intervention Not Indicated  Housing Interventions Intervention Not Indicated  Transportation Interventions Intervention Not Indicated        Advanced Directives Status: Not addressed in this encounter.  CCM Care Plan  No Known Allergies  Outpatient Encounter Medications as of 12/26/2020  Medication Sig   amLODipine (NORVASC) 5 MG tablet Take 1 tablet (5 mg total) by mouth daily.   atorvastatin (LIPITOR) 80 MG tablet Take 1 tablet (80 mg total) by mouth daily.   benazepril (LOTENSIN) 40 MG tablet Take 1 tablet (40 mg total) by mouth daily.   cetirizine (ZYRTEC) 10 MG tablet Take 10 mg by mouth daily as needed for allergies.   fenofibrate micronized (LOFIBRA) 134 MG capsule Take 1 capsule (134 mg total) by mouth daily before breakfast.   ferrous sulfate 325 (65 FE) MG tablet Take 325 mg by mouth daily with breakfast.   hydrochlorothiazide (HYDRODIURIL) 25 MG tablet Take 1 tablet (25 mg total) by mouth daily.   Multiple Vitamins-Minerals (CENTRUM SILVER 50+MEN) TABS Take 1 tablet by mouth daily.   nicotine (NICODERM CQ)  21 mg/24hr patch Place 1 patch (21 mg total) onto the skin daily.   tiotropium (SPIRIVA HANDIHALER) 18 MCG inhalation capsule Place 1 capsule (18 mcg total) into inhaler and inhale every morning.   No facility-administered encounter medications on file as of  12/26/2020.    Patient Active Problem List   Diagnosis Date Noted   Prostate cancer ([place]) 08/26/2017   Advanced care planning/counseling discussion 01/12/2017   Chronic alcoholism ([place]) 07/14/2016   Eczema 01/08/2016   COPD (chronic obstructive pulmonary disease) ([place]) 01/07/2015   Hypercholesteremia 01/07/2015   Essential hypertension 11/09/2014   Alcohol abuse counseling and surveillance 11/09/2014   History of atrial fibrillation 10/12/2013    Conditions to be addressed/monitored: Anxiety; Level of care concerns and Mental Health Concerns   Care Plan : General Social Work (Adult)  Updates made by Rebekah Chesterfield, LCSW since 12/26/2020 12:00 AM     Problem: Coping Skills (General Plan of Care)      Goal: Coping Skills Enhanced   Start Date: 12/26/2020  This Visit's Progress: On track  Priority: High  Note:   Current barriers:   Level of care concerns and Substance abuse issues -  Tobacco use and hx of Alcohol Use Needs Support, Education, and Care Coordination in order to meet unmet mental health needs Clinical Goal(s): Over the next 120 days, patient will work with SW, counselor and therapist to reduce or manage symptoms of agitation, mood instability, stress, and bipolar until connected for ongoing counseling. Clinical Interventions:  Assessed patient's previous and current treatment, coping skills, support system and barriers to care  Patient interviewed and appropriate assessments performed Patient endorses feelings of anxiety triggered by diagnosis of AAA, which may result in open heart surgery. Patient has an upcoming appointment scheduled on August 11, 22 with Cardiologist Patient denies the need of therapy or medication management to assist with management of symptoms. Denies depression symptoms. Denies suicidal or homicidal ideation Patient resides alone and prides self on being independent. He is concerned that he will need to be placed at a SNF, if surgery is  needed Patient receives strong support from brother and sister in law, who resides in [place], [place] LCSW provided validation and encouragement. Strategies to prepare for upcoming appointment were identified. Patient agreed to have brother accompany him to the appointment and provide transportation. He agreed to list appropriate questions to discuss with provider and have brother take notes to reference later Patient reports continued cigarette smoking. States he is aware that he needs to quit in the near future. He has a box of nicotine patches to assist with tobacco cessation Patient shared that since sobriety from alcohol, he feels better and has an improved sleep hygiene. Patient shared that he remains motivated to stay sober Patient reports compliance with choloestrol medicine.  CCM LCSW reviewed upcoming appointments. Patient keeps AVS to assist him with keeping track of appointments Mindfulness or Relaxation Training, Active listening / Reflection utilized , Emotional Supportive Provided, Psychoeducation for mental health needs , and Verbalization of feelings encouraged  ; Discussed plans with patient for ongoing care management follow up and provided patient with direct contact information for care management team Collaboration with PCP regarding development and update of comprehensive plan of care as evidenced by provider attestation and co-signature Inter-disciplinary care team collaboration (see longitudinal plan of care) Patient Goals/Self-Care Activities: Over the next 120 days Attend all scheduled medical appointments Contact clinic with any questions or concerns Utilize strategies discussed to assist with stress management  Christa See, MSW, [email or web address] Phone [phone] 2:40 PM

## 2021-01-07 ENCOUNTER — Encounter: Payer: PPO | Admitting: Thoracic Surgery (Cardiothoracic Vascular Surgery)

## 2021-01-09 ENCOUNTER — Encounter: Payer: PPO | Admitting: Cardiothoracic Surgery

## 2021-01-10 ENCOUNTER — Telehealth: Payer: Self-pay | Admitting: Internal Medicine

## 2021-01-10 DIAGNOSIS — I712 Thoracic aortic aneurysm, without rupture: Secondary | ICD-10-CM | POA: Diagnosis not present

## 2021-01-10 NOTE — Telephone Encounter (Signed)
Copied from [place] [id number]. Topic: Medicare AWV >> Jan 10, 2021  3:32 PM Lavonia Drafts wrote:  Reason for CRM: Left message for patient to call back and schedule the Medicare Annual Wellness Visit (AWV) virtually or by telephone.  Last AWV 02/01/19  Please schedule at anytime with CFP-Nurse Health Advisor.  45 minute appointment  Any questions, please call me at [phone]

## 2021-01-13 ENCOUNTER — Institutional Professional Consult (permissible substitution): Payer: PPO | Admitting: Thoracic Surgery (Cardiothoracic Vascular Surgery)

## 2021-01-13 ENCOUNTER — Encounter: Payer: Self-pay | Admitting: Thoracic Surgery (Cardiothoracic Vascular Surgery)

## 2021-01-13 ENCOUNTER — Other Ambulatory Visit: Payer: Self-pay

## 2021-01-13 ENCOUNTER — Telehealth: Payer: Self-pay

## 2021-01-13 VITALS — BP 135/79 | HR 79 | Resp 20 | Ht 66.0 in | Wt 152.0 lb

## 2021-01-13 DIAGNOSIS — I712 Thoracic aortic aneurysm, without rupture: Secondary | ICD-10-CM

## 2021-01-13 DIAGNOSIS — I7121 Aneurysm of the ascending aorta, without rupture: Secondary | ICD-10-CM

## 2021-01-13 NOTE — Telephone Encounter (Signed)
Copied from [place] [phone]. Topic: Appointment Scheduling - Scheduling Inquiry for Clinic >> Jan 10, 2021  4:09 PM Bayard Beaver wrote: Reason for CRM:pt called needs to schedule AWV. PLease call back

## 2021-01-14 ENCOUNTER — Encounter: Payer: Self-pay | Admitting: Thoracic Surgery (Cardiothoracic Vascular Surgery)

## 2021-01-14 NOTE — Progress Notes (Signed)
[address]       [place] [address]             [phone]                    Carl Allen [id number] Date of Birth: 04-02-1946  Referring: Charlynne Cousins, MD Primary Care: Charlynne Cousins, MD Primary Cardiologist: None  Chief Complaint:    Chief Complaint  Patient presents with   Thoracic Aortic Aneurysm    Initial surgical consult, CT chest 7/5    History of Present Illness:    Carl Allen 75 y.o. male referred for surgical evaluation of 4.8 cm ascending aortic aneurysm was found incidentally on the lung cancer screening CT.  He denies any chest pain or shortness of breath.  He recently was seen primary care doctor and has been started on blood pressure medication as well as cholesterol medication.  He does continue to smoke but states that he is willing to quit.    Past Medical History:  Diagnosis Date   Allergy    Anxiety    not often   Arthritis    fingers, knees   Cancer ([place])    Cataract    Chronic alcoholism ([place])    COPD (chronic obstructive pulmonary disease) ([place])    Depression    pt states sometimes   Dyspnea    Emphysema of lung ([facility])    GERD (gastroesophageal reflux disease)    pt states every now and then   Hypertension    Hypertriglyceridemia     Past Surgical History:  Procedure Laterality Date   CATARACT EXTRACTION, BILATERAL  2014   COLONOSCOPY  2012   CYSTOSCOPY N/A 01/10/2018   Procedure: CYSTOSCOPY;  Surgeon: Hollice Espy, MD;  Location: [facility] ORS;  Service: Urology;  Laterality: N/A;   RADIOACTIVE SEED IMPLANT N/A 01/10/2018   Procedure: RADIOACTIVE SEED IMPLANT/BRACHYTHERAPY IMPLANT;  Surgeon: Hollice Espy, MD;  Location: [facility] ORS;  Service: Urology;  Laterality: N/A;   TONSILLECTOMY     TONSILLECTOMY AND ADENOIDECTOMY  1952    Family History  Problem Relation Age of Onset   Arthritis Mother    Heart disease Mother    Hearing loss Mother    Hyperlipidemia Mother     Hypertension Mother    Kidney disease Mother    Miscarriages / [place] Mother    Vision loss Mother    Stroke Father    Heart disease Maternal Grandmother    Stroke Maternal Grandfather    Cancer Paternal Grandfather      Social History   Tobacco Use  Smoking Status Every Day   Packs/day: 1.00   Years: 57.00   Pack years: 57.00   Types: Cigarettes  Smokeless Tobacco Never    Social History   Substance and Sexual Activity  Alcohol Use Yes   Alcohol/week: 3.0 standard drinks   Types: 3 Shots of liquor per week   Comment: pt is currently trying to quit     No Known Allergies  Current Outpatient Medications  Medication Sig Dispense Refill   amLODipine (NORVASC) 5 MG tablet Take 1 tablet (5 mg total) by mouth daily. 90 tablet 1   atorvastatin (LIPITOR) 80 MG tablet Take 1 tablet (80 mg total) by mouth daily. 90 tablet 3   benazepril (LOTENSIN) 40 MG tablet Take 1 tablet (40 mg total) by mouth daily. 90 tablet 1   cetirizine (ZYRTEC) 10 MG tablet  Take 10 mg by mouth daily as needed for allergies.     fenofibrate micronized (LOFIBRA) 134 MG capsule Take 1 capsule (134 mg total) by mouth daily before breakfast. 90 capsule 1   ferrous sulfate 325 (65 FE) MG tablet Take 325 mg by mouth daily with breakfast.     hydrochlorothiazide (HYDRODIURIL) 25 MG tablet Take 1 tablet (25 mg total) by mouth daily. 90 tablet 1   Multiple Vitamins-Minerals (CENTRUM SILVER 50+MEN) TABS Take 1 tablet by mouth daily.     nicotine (NICODERM CQ) 21 mg/24hr patch Place 1 patch (21 mg total) onto the skin daily. 28 patch 0   tiotropium (SPIRIVA HANDIHALER) 18 MCG inhalation capsule Place 1 capsule (18 mcg total) into inhaler and inhale every morning. 90 capsule 1   No current facility-administered medications for this visit.    Review of Systems  Respiratory: Negative.    Cardiovascular: Negative.   Gastrointestinal: Negative.   Neurological: Negative.    PHYSICAL EXAMINATION: BP 135/79  (BP Location: Left Arm, Patient Position: Sitting, Cuff Size: Normal)   Pulse 79   Resp 20   Ht '5\' 6"'$  (1.676 m)   Wt 152 lb (68.9 kg)   SpO2 98% Comment: RA  BMI 24.53 kg/m   Physical Exam Constitutional:      General: He is not in acute distress.    Appearance: He is normal weight. He is not ill-appearing.  HENT:     Head: Normocephalic and atraumatic.  Eyes:     Extraocular Movements: Extraocular movements intact.  Cardiovascular:     Rate and Rhythm: Normal rate.  Pulmonary:     Effort: Pulmonary effort is normal. No respiratory distress.  Abdominal:     General: Abdomen is flat. There is no distension.  Musculoskeletal:        General: Normal range of motion.     Cervical back: Normal range of motion.  Skin:    General: Skin is warm and dry.  Neurological:     General: No focal deficit present.     Mental Status: He is alert and oriented to person, place, and time.     Diagnostic Studies & Laboratory data:     Recent Radiology Findings:   No results found.     I have independently reviewed the above radiology studies  and reviewed the findings with the patient.   Recent Lab Findings: Lab Results  Component Value Date   WBC 6.7 11/04/2020   HGB 14.2 11/04/2020   HCT 43.2 11/04/2020   PLT 180 11/04/2020   GLUCOSE 86 11/04/2020   CHOL 215 (H) 11/04/2020   TRIG 232 (H) 11/04/2020   HDL 32 (L) 11/04/2020   LDLCALC 141 (H) 11/04/2020   ALT 8 11/04/2020   AST 14 11/04/2020   NA 140 11/04/2020   K 4.3 11/04/2020   CL 101 11/04/2020   CREATININE 0.88 11/04/2020   BUN 16 11/04/2020   CO2 23 11/04/2020   TSH 2.830 11/04/2020       Assessment / Plan:   75 year old male with 4.8 cm ascending aneurysm.  He recently had an echocardiogram that was performed at the [place] [facility] at [facility].  The records are unavailable we will obtain those to assess his aortic valve.  He does not have a murmur on exam, and has no stigmata of a genetic  disorder.  I once echocardiogram has been reviewed, and there is no evidence of any bicuspid aortic valve that he will fall  into the standard surveillance protocol.  His neck CTA will be in 6 months.  I expressed the importance of smoking cessation, as well as a cholesterol and blood pressure management to prevent progression of this.  We did go over the warning signs of an aortic dissection.  He will follow-up as a virtual visit in 6 months with another CTA.      Lajuana Matte 01/14/2021 2:55 PM

## 2021-01-24 ENCOUNTER — Ambulatory Visit (INDEPENDENT_AMBULATORY_CARE_PROVIDER_SITE_OTHER): Payer: PPO

## 2021-01-24 VITALS — Ht 67.0 in | Wt 152.0 lb

## 2021-01-24 DIAGNOSIS — Z0000 Encounter for general adult medical examination without abnormal findings: Secondary | ICD-10-CM | POA: Diagnosis not present

## 2021-01-24 NOTE — Patient Instructions (Signed)
Carl Allen , Thank you for taking time to come for your Medicare Wellness Visit. I appreciate your ongoing commitment to your health goals. Please review the following plan we discussed and let me know if I can assist you in the future.   Screening recommendations/referrals: Colonoscopy: completed 07/02/2010 Recommended yearly ophthalmology/optometry visit for glaucoma screening and checkup Recommended yearly dental visit for hygiene and checkup  Vaccinations: Influenza vaccine: due Pneumococcal vaccine: completed 12/21/2013 Tdap vaccine: completed 01/08/2016, due 01/07/2026 Shingles vaccine: discussed   Covid-19: 10/04/2020, 02/29/2020, 08/02/2019, 07/12/2019  Advanced directives: Advance directive discussed with you today. .  Conditions/risks identified: smoking  Next appointment: Follow up in one year for your annual wellness visit.   Preventive Care 70 Years and Older, Male Preventive care refers to lifestyle choices and visits with your health care provider that can promote health and wellness. What does preventive care include? A yearly physical exam. This is also called an annual well check. Dental exams once or twice a year. Routine eye exams. Ask your health care provider how often you should have your eyes checked. Personal lifestyle choices, including: Daily care of your teeth and gums. Regular physical activity. Eating a healthy diet. Avoiding tobacco and drug use. Limiting alcohol use. Practicing safe sex. Taking low doses of aspirin every day. Taking vitamin and mineral supplements as recommended by your health care provider. What happens during an annual well check? The services and screenings done by your health care provider during your annual well check will depend on your age, overall health, lifestyle risk factors, and family history of disease. Counseling  Your health care provider may ask you questions about your: Alcohol use. Tobacco use. Drug use. Emotional  well-being. Home and relationship well-being. Sexual activity. Eating habits. History of falls. Memory and ability to understand (cognition). Work and work Statistician. Screening  You may have the following tests or measurements: Height, weight, and BMI. Blood pressure. Lipid and cholesterol levels. These may be checked every 5 years, or more frequently if you are over 5 years old. Skin check. Lung cancer screening. You may have this screening every year starting at age 71 if you have a 30-pack-year history of smoking and currently smoke or have quit within the past 15 years. Fecal occult blood test (FOBT) of the stool. You may have this test every year starting at age 3. Flexible sigmoidoscopy or colonoscopy. You may have a sigmoidoscopy every 5 years or a colonoscopy every 10 years starting at age 10. Prostate cancer screening. Recommendations will vary depending on your family history and other risks. Hepatitis C blood test. Hepatitis B blood test. Sexually transmitted disease (STD) testing. Diabetes screening. This is done by checking your blood sugar (glucose) after you have not eaten for a while (fasting). You may have this done every 1-3 years. Abdominal aortic aneurysm (AAA) screening. You may need this if you are a current or former smoker. Osteoporosis. You may be screened starting at age 97 if you are at high risk. Talk with your health care provider about your test results, treatment options, and if necessary, the need for more tests. Vaccines  Your health care provider may recommend certain vaccines, such as: Influenza vaccine. This is recommended every year. Tetanus, diphtheria, and acellular pertussis (Tdap, Td) vaccine. You may need a Td booster every 10 years. Zoster vaccine. You may need this after age 7. Pneumococcal 13-valent conjugate (PCV13) vaccine. One dose is recommended after age 5. Pneumococcal polysaccharide (PPSV23) vaccine. One dose is recommended after  age 72. Talk to your health care provider about which screenings and vaccines you need and how often you need them. This information is not intended to replace advice given to you by your health care provider. Make sure you discuss any questions you have with your health care provider. Document Released: 06/14/2015 Document Revised: 02/05/2016 Document Reviewed: 03/19/2015 Elsevier Interactive Patient Education  2017 [place] Prevention in the Home Falls can cause injuries. They can happen to people of all ages. There are many things you can do to make your home safe and to help prevent falls. What can I do on the outside of my home? Regularly fix the edges of walkways and driveways and fix any cracks. Remove anything that might make you trip as you walk through a door, such as a raised step or threshold. Trim any bushes or trees on the path to your home. Use bright outdoor lighting. Clear any walking paths of anything that might make someone trip, such as rocks or tools. Regularly check to see if handrails are loose or broken. Make sure that both sides of any steps have handrails. Any raised decks and porches should have guardrails on the edges. Have any leaves, snow, or ice cleared regularly. Use sand or salt on walking paths during winter. Clean up any spills in your garage right away. This includes oil or grease spills. What can I do in the bathroom? Use night lights. Install grab bars by the toilet and in the tub and shower. Do not use towel bars as grab bars. Use non-skid mats or decals in the tub or shower. If you need to sit down in the shower, use a plastic, non-slip stool. Keep the floor dry. Clean up any water that spills on the floor as soon as it happens. Remove soap buildup in the tub or shower regularly. Attach bath mats securely with double-sided non-slip rug tape. Do not have throw rugs and other things on the floor that can make you trip. What can I do in the  bedroom? Use night lights. Make sure that you have a light by your bed that is easy to reach. Do not use any sheets or blankets that are too big for your bed. They should not hang down onto the floor. Have a firm chair that has side arms. You can use this for support while you get dressed. Do not have throw rugs and other things on the floor that can make you trip. What can I do in the kitchen? Clean up any spills right away. Avoid walking on wet floors. Keep items that you use a lot in easy-to-reach places. If you need to reach something above you, use a strong step stool that has a grab bar. Keep electrical cords out of the way. Do not use floor polish or wax that makes floors slippery. If you must use wax, use non-skid floor wax. Do not have throw rugs and other things on the floor that can make you trip. What can I do with my stairs? Do not leave any items on the stairs. Make sure that there are handrails on both sides of the stairs and use them. Fix handrails that are broken or loose. Make sure that handrails are as long as the stairways. Check any carpeting to make sure that it is firmly attached to the stairs. Fix any carpet that is loose or worn. Avoid having throw rugs at the top or bottom of the stairs. If you do have  throw rugs, attach them to the floor with carpet tape. Make sure that you have a light switch at the top of the stairs and the bottom of the stairs. If you do not have them, ask someone to add them for you. What else can I do to help prevent falls? Wear shoes that: Do not have high heels. Have rubber bottoms. Are comfortable and fit you well. Are closed at the toe. Do not wear sandals. If you use a stepladder: Make sure that it is fully opened. Do not climb a closed stepladder. Make sure that both sides of the stepladder are locked into place. Ask someone to hold it for you, if possible. Clearly mark and make sure that you can see: Any grab bars or  handrails. First and last steps. Where the edge of each step is. Use tools that help you move around (mobility aids) if they are needed. These include: Canes. Walkers. Scooters. Crutches. Turn on the lights when you go into a dark area. Replace any light bulbs as soon as they burn out. Set up your furniture so you have a clear path. Avoid moving your furniture around. If any of your floors are uneven, fix them. If there are any pets around you, be aware of where they are. Review your medicines with your doctor. Some medicines can make you feel dizzy. This can increase your chance of falling. Ask your doctor what other things that you can do to help prevent falls. This information is not intended to replace advice given to you by your health care provider. Make sure you discuss any questions you have with your health care provider. Document Released: 03/14/2009 Document Revised: 10/24/2015 Document Reviewed: 06/22/2014 Elsevier Interactive Patient Education  2017 [employer].

## 2021-01-24 NOTE — Progress Notes (Signed)
I connected with Carl Allen today by telephone and verified that I am speaking with the correct person using two identifiers. Location patient: home Location provider: work Persons participating in the virtual visit Carl Allen, Glenna Durand LPN.   I discussed the limitations, risks, security and privacy concerns of performing an evaluation and management service by telephone and the availability of in person appointments. I also discussed with the patient that there may be a patient responsible charge related to this service. The patient expressed understanding and verbally consented to this telephonic visit.    Interactive audio and video telecommunications were attempted between this provider and patient, however failed, due to patient having technical difficulties OR patient did not have access to video capability.  We continued and completed visit with audio only.     Vital signs may be patient reported or missing.  Subjective:   Carl Allen is a 75 y.o. male who presents for Medicare Annual/Subsequent preventive examination.  Review of Systems     Cardiac Risk Factors include: advanced age (>49mn, >>53women);dyslipidemia;hypertension;male gender;sedentary lifestyle;smoking/ tobacco exposure     Objective:    Today's Vitals   01/24/21 0942  Weight: 152 lb (68.9 kg)  Height: '5\' 7"'$  (1.702 m)   Body mass index is 23.81 kg/m.  Advanced Directives 01/24/2021 08/31/2019 02/16/2019 02/01/2019 07/18/2018 02/14/2018 01/26/2018  Does Patient Have a Medical Advance Directive? No No No No No - No  Would patient like information on creating a medical advance directive? - No - Patient declined No - Patient declined - No - Patient declined No - Patient declined Yes (MAU/Ambulatory/Procedural Areas - Information given)    Current Medications (verified) Outpatient Encounter Medications as of 01/24/2021  Medication Sig   amLODipine (NORVASC) 5 MG tablet Take 1 tablet (5 mg total) by  mouth daily.   atorvastatin (LIPITOR) 80 MG tablet Take 1 tablet (80 mg total) by mouth daily.   benazepril (LOTENSIN) 40 MG tablet Take 1 tablet (40 mg total) by mouth daily.   cetirizine (ZYRTEC) 10 MG tablet Take 10 mg by mouth daily as needed for allergies.   fenofibrate micronized (LOFIBRA) 134 MG capsule Take 1 capsule (134 mg total) by mouth daily before breakfast.   ferrous sulfate 325 (65 FE) MG tablet Take 325 mg by mouth daily with breakfast.   hydrochlorothiazide (HYDRODIURIL) 25 MG tablet Take 1 tablet (25 mg total) by mouth daily.   Multiple Vitamins-Minerals (CENTRUM SILVER 50+MEN) TABS Take 1 tablet by mouth daily.   tiotropium (SPIRIVA HANDIHALER) 18 MCG inhalation capsule Place 1 capsule (18 mcg total) into inhaler and inhale every morning.   nicotine (NICODERM CQ) 21 mg/24hr patch Place 1 patch (21 mg total) onto the skin daily. (Patient not taking: Reported on 01/24/2021)   No facility-administered encounter medications on file as of 01/24/2021.    Allergies (verified) Patient has no known allergies.   History: Past Medical History:  Diagnosis Date   Allergy    Anxiety    not often   Arthritis    fingers, knees   Cancer ([facility]    Cataract    Chronic alcoholism ([place]    COPD (chronic obstructive pulmonary disease) ([facility]    Depression    pt states sometimes   Dyspnea    Emphysema of lung ([facility])    GERD (gastroesophageal reflux disease)    pt states every now and then   Hypertension    Hypertriglyceridemia    Past Surgical History:  Procedure Laterality Date  CATARACT EXTRACTION, BILATERAL  2014   COLONOSCOPY  2012   CYSTOSCOPY N/A 01/10/2018   Procedure: CYSTOSCOPY;  Surgeon: Hollice Espy, MD;  Location: [facility] ORS;  Service: Urology;  Laterality: N/A;   RADIOACTIVE SEED IMPLANT N/A 01/10/2018   Procedure: RADIOACTIVE SEED IMPLANT/BRACHYTHERAPY IMPLANT;  Surgeon: Hollice Espy, MD;  Location: [facility] ORS;  Service: Urology;  Laterality: N/A;   TONSILLECTOMY      TONSILLECTOMY AND ADENOIDECTOMY  1952   Family History  Problem Relation Age of Onset   Arthritis Mother    Heart disease Mother    Hearing loss Mother    Hyperlipidemia Mother    Hypertension Mother    Kidney disease Mother    Miscarriages / [place] Mother    Vision loss Mother    Stroke Father    Heart disease Maternal Grandmother    Stroke Maternal Grandfather    Cancer Paternal Grandfather    Social History   Socioeconomic History   Marital status: Single    Spouse name: Not on file   Number of children: Not on file   Years of education: college   Highest education level: Bachelor's degree (e.g., BA, AB, BS)  Occupational History    Comment: Retired  Tobacco Use   Smoking status: Every Day    Packs/day: 0.50    Years: 57.00    Pack years: 28.50    Types: Cigarettes   Smokeless tobacco: Never  Vaping Use   Vaping Use: Never used  Substance and Sexual Activity   Alcohol use: Yes    Alcohol/week: 3.0 standard drinks    Types: 3 Shots of liquor per week    Comment: pt is currently trying to quit   Drug use: No   Sexual activity: Not Currently  Other Topics Concern   Not on file  Social History Narrative   Not on file   Social Determinants of Health   Financial Resource Strain: Low Risk    Difficulty of Paying Living Expenses: Not hard at all  Food Insecurity: No Food Insecurity   Worried About Charity fundraiser in the Last Year: Never true   [place] in the Last Year: Never true  Transportation Needs: No Transportation Needs   Lack of Transportation (Medical): No   Lack of Transportation (Non-Medical): No  Physical Activity: Inactive   Days of Exercise per Week: 0 days   Minutes of Exercise per Session: 0 min  Stress: No Stress Concern Present   Feeling of Stress : Only a little  Social Connections: Socially Isolated   Frequency of Communication with Friends and Family: More than three times a week   Frequency of Social Gatherings with  Friends and Family: More than three times a week   Attends Religious Services: Never   Marine scientist or Organizations: No   Attends Music therapist: Never   Marital Status: Never married    Tobacco Counseling Ready to quit: Yes Counseling given: Not Answered   Clinical Intake:  Pre-visit preparation completed: Yes  Pain : No/denies pain     Nutritional Status: BMI of 19-24  Normal Nutritional Risks: None Diabetes: No  How often do you need to have someone help you when you read instructions, pamphlets, or other written materials from your doctor or pharmacy?: 1 - Never What is the last grade level you completed in school?: college  Diabetic? no  Interpreter Needed?: No  Information entered by :: NAllen LPN   Activities of  Daily Living In your present state of health, do you have any difficulty performing the following activities: 01/24/2021 03/12/2020  Hearing? N N  Vision? N N  Difficulty concentrating or making decisions? N N  Walking or climbing stairs? N N  Dressing or bathing? N N  Doing errands, shopping? N N  Preparing Food and eating ? N -  Using the Toilet? N -  In the past six months, have you accidently leaked urine? N -  Do you have problems with loss of bowel control? N -  Managing your Medications? N -  Managing your Finances? N -  Housekeeping or managing your Housekeeping? N -  Some recent data might be hidden    Patient Care Team: Charlynne Cousins, MD as PCP - General Erby Pian, MD as Referring Physician (Specialist) Teodoro Spray, MD as Consulting Physician (Cardiology) Noreene Filbert, MD as Referring Physician (Radiation Oncology) Hollice Espy, MD as Consulting Physician (Urology) Vanita Ingles, RN as Case Manager ([place]) Noreene Filbert, MD as Radiation Oncologist (Radiation Oncology) Rebekah Chesterfield, LCSW as Social Worker (Licensed Clinical Social Worker)  Indicate any recent [place] you may have received from other than [facility] providers in the past year (date may be approximate).     Assessment:   This is a routine wellness examination for Jesusantonio.  Hearing/Vision screen Vision Screening - Comments:: No regular eye exams,  Dietary issues and exercise activities discussed: Current Exercise Habits: The patient does not participate in regular exercise at present   Goals Addressed             This Visit's Progress    Patient Stated       01/24/2021, quit smoking and live 10-15 years maintaining independence       Depression Screen PHQ 2/9 Scores 01/24/2021 12/04/2020 11/04/2020 10/18/2020 03/12/2020 09/12/2019 09/04/2019  PHQ - 2 Score 0 0 0 0 0 0 0  PHQ- 9 Score - - 0 - 0 - 2    Fall Risk Fall Risk  01/24/2021 12/04/2020 11/04/2020 09/04/2019 02/01/2019  Falls in the past year? 0 0 0 0 0  Number falls in past yr: - 0 0 0 -  Injury with Fall? - 0 0 0 -  Risk for fall due to : Medication side effect No Fall Risks No Fall Risks - -  Follow up Falls evaluation completed;Education provided;Falls prevention discussed Falls evaluation completed Falls evaluation completed - -    FALL RISK PREVENTION PERTAINING TO THE HOME:  Any stairs in or around the home? Yes  If so, are there any without handrails? No  Home free of loose throw rugs in walkways, pet beds, electrical cords, etc? Yes  Adequate lighting in your home to reduce risk of falls? Yes   ASSISTIVE DEVICES UTILIZED TO PREVENT FALLS:  Life alert? No  Use of a cane, walker or w/c? No  Grab bars in the bathroom? No  Shower chair or bench in shower? No  Elevated toilet seat or a handicapped toilet? Yes   TIMED UP AND GO:  Was the test performed? No .      Cognitive Function:     6CIT Screen 01/24/2021 01/26/2018 01/06/2017  What Year? 0 points 0 points 0 points  What month? 0 points 0 points 0 points  What time? 0 points 0 points 0 points  Count back from 20 0 points 0 points 0 points  Months in  reverse 0 points 0 points 0 points  Repeat phrase 0 points 0 points 0 points  Total Score 0 0 0    Immunizations Immunization History  Administered Date(s) Administered   Fluad Quad(high Dose 65+) 03/07/2019, 03/12/2020   Influenza, High Dose Seasonal PF 04/07/2017, 02/02/2018   Influenza-Unspecified 02/09/2015, 02/24/2016   PFIZER(Purple Top)SARS-COV-2 Vaccination 07/12/2019, 08/02/2019, 02/29/2020, 10/04/2020   Pneumococcal Conjugate-13 12/21/2013   Pneumococcal Polysaccharide-23 03/22/2008, 02/13/2013   Td 09/20/2001   Tdap 01/08/2016   Zoster, Live 12/18/2010    TDAP status: Up to date  Flu Vaccine status: Due, Education has been provided regarding the importance of this vaccine. Advised may receive this vaccine at local pharmacy or Health Dept. Aware to provide a copy of the vaccination record if obtained from local pharmacy or Health Dept. Verbalized acceptance and understanding.  Pneumococcal vaccine status: Up to date  Covid-19 vaccine status: Completed vaccines  Qualifies for Shingles Vaccine? Yes   Zostavax completed Yes   Shingrix Completed?: No.    Education has been provided regarding the importance of this vaccine. Patient has been advised to call insurance company to determine out of pocket expense if they have not yet received this vaccine. Advised may also receive vaccine at local pharmacy or Health Dept. Verbalized acceptance and understanding.  Screening Tests Health Maintenance  Topic Date Due   Zoster Vaccines- Shingrix (1 of 2) Never done   COLONOSCOPY (Pts 45-19yr Insurance coverage will need to be confirmed)  07/02/2020   INFLUENZA VACCINE  12/30/2020   COVID-19 Vaccine (5 - Booster for Pfizer series) 02/04/2021   TETANUS/TDAP  01/07/2026   Hepatitis C Screening  Completed   PNA vac Low Risk Adult  Completed   HPV VACCINES  Aged Out    Health Maintenance  Health Maintenance Due  Topic Date Due   Zoster Vaccines- Shingrix (1 of 2) Never done    COLONOSCOPY (Pts 45-468yrInsurance coverage will need to be confirmed)  07/02/2020   INFLUENZA VACCINE  12/30/2020    Colorectal cancer screening: Type of screening: Colonoscopy. Completed 07/02/2010. Repeat every 10 years  Lung Cancer Screening: (Low Dose CT Chest recommended if Age 692-80ears, 30 pack-year currently smoking OR have quit w/in 15years.) does qualify.   Lung Cancer Screening Referral: CT scan 12/03/2020  Additional Screening:  Hepatitis C Screening: does qualify; Completed 01/08/2016  Vision Screening: Recommended annual ophthalmology exams for early detection of glaucoma and other disorders of the eye. Is the patient up to date with their annual eye exam?  No  Who is the provider or what is the name of the office in which the patient attends annual eye exams? none If pt is not established with a provider, would they like to be referred to a provider to establish care? No .   Dental Screening: Recommended annual dental exams for proper oral hygiene  Community Resource Referral / Chronic Care Management: CRR required this visit?  No   CCM required this visit?  No      Plan:     I have personally reviewed and noted the following in the patient's chart:   Medical and social history Use of alcohol, tobacco or illicit drugs  Current medications and supplements including opioid prescriptions. Patient is not currently taking opioid prescriptions. Functional ability and status Nutritional status Physical activity Advanced directives List of other physicians Hospitalizations, surgeries, and ER visits in previous 12 months Vitals Screenings to include cognitive, depression, and falls Referrals and appointments  In addition, I have reviewed and discussed with patient certain preventive  protocols, quality metrics, and best practice recommendations. A written personalized care plan for preventive services as well as general preventive health recommendations were provided  to patient.     Kellie Simmering, LPN   [id number]   Nurse Notes:

## 2021-01-28 ENCOUNTER — Other Ambulatory Visit: Payer: PPO

## 2021-02-04 ENCOUNTER — Ambulatory Visit: Payer: PPO | Admitting: Internal Medicine

## 2021-02-04 ENCOUNTER — Other Ambulatory Visit: Payer: PPO

## 2021-02-04 ENCOUNTER — Other Ambulatory Visit: Payer: Self-pay

## 2021-02-04 DIAGNOSIS — I10 Essential (primary) hypertension: Secondary | ICD-10-CM | POA: Diagnosis not present

## 2021-02-04 DIAGNOSIS — D649 Anemia, unspecified: Secondary | ICD-10-CM

## 2021-02-04 DIAGNOSIS — E785 Hyperlipidemia, unspecified: Secondary | ICD-10-CM | POA: Diagnosis not present

## 2021-02-05 ENCOUNTER — Telehealth: Payer: Self-pay

## 2021-02-05 LAB — CMP14+EGFR
ALT: 12 IU/L (ref 0–44)
AST: 12 IU/L (ref 0–40)
Albumin/Globulin Ratio: 2 (ref 1.2–2.2)
Albumin: 4.7 g/dL (ref 3.7–4.7)
Alkaline Phosphatase: 58 IU/L (ref 44–121)
BUN/Creatinine Ratio: 20 (ref 10–24)
BUN: 17 mg/dL (ref 8–27)
Bilirubin Total: 0.5 mg/dL (ref 0.0–1.2)
CO2: 23 mmol/L (ref 20–29)
Calcium: 9.7 mg/dL (ref 8.6–10.2)
Chloride: 105 mmol/L (ref 96–106)
Creatinine, Ser: 0.87 mg/dL (ref 0.76–1.27)
Globulin, Total: 2.4 g/dL (ref 1.5–4.5)
Glucose: 98 mg/dL (ref 65–99)
Potassium: 4.1 mmol/L (ref 3.5–5.2)
Sodium: 143 mmol/L (ref 134–144)
Total Protein: 7.1 g/dL (ref 6.0–8.5)
eGFR: 90 mL/min/{1.73_m2} (ref >59)

## 2021-02-05 LAB — CBC WITH DIFFERENTIAL/PLATELET
Basophils Absolute: 0.1 10*3/uL (ref 0.0–0.2)
Basos: 1 %
EOS (ABSOLUTE): 0.1 10*3/uL (ref 0.0–0.4)
Eos: 1 %
Hematocrit: 43.1 % (ref 37.5–51.0)
Hemoglobin: 14.5 g/dL (ref 13.0–17.7)
Immature Grans (Abs): 0 10*3/uL (ref 0.0–0.1)
Immature Granulocytes: 1 %
Lymphocytes Absolute: 1.8 10*3/uL (ref 0.7–3.1)
Lymphs: 28 %
MCH: 31 pg (ref 26.6–33.0)
MCHC: 33.6 g/dL (ref 31.5–35.7)
MCV: 92 fL (ref 79–97)
Monocytes Absolute: 0.5 10*3/uL (ref 0.1–0.9)
Monocytes: 7 %
Neutrophils Absolute: 4 10*3/uL (ref 1.4–7.0)
Neutrophils: 62 %
Platelets: 202 10*3/uL (ref 150–450)
RBC: 4.67 x10E6/uL (ref 4.14–5.80)
RDW: 13.1 % (ref 11.6–15.4)
WBC: 6.4 10*3/uL (ref 3.4–10.8)

## 2021-02-05 LAB — LIPID PANEL
Chol/HDL Ratio: 3.7 ratio (ref 0.0–5.0)
Cholesterol, Total: 115 mg/dL (ref 100–199)
HDL: 31 mg/dL — ABNORMAL LOW (ref >39)
LDL Chol Calc (NIH): 57 mg/dL (ref 0–99)
Triglycerides: 157 mg/dL — ABNORMAL HIGH (ref 0–149)
VLDL Cholesterol Cal: 27 mg/dL (ref 5–40)

## 2021-02-05 NOTE — Telephone Encounter (Signed)
  Care Management   Follow Up Note   02/05/2021 Name: TYRON NAKAO MRN: [id number] DOB: [birth year]   Referred by: Charlynne Cousins, MD Reason for referral : Chronic Care Management (RNCM: Follow up for Chronic Disease Management and Care Coordination Needs)   An unsuccessful telephone outreach was attempted today. The patient was referred to the case management team for assistance with care management and care coordination.   Follow Up Plan: A HIPPA compliant phone message was left for the patient providing contact information and requesting a return call.   Noreene Larsson RN, MSN, [facility] Mobile: [phone]

## 2021-02-10 ENCOUNTER — Ambulatory Visit (INDEPENDENT_AMBULATORY_CARE_PROVIDER_SITE_OTHER): Payer: PPO | Admitting: Internal Medicine

## 2021-02-10 ENCOUNTER — Other Ambulatory Visit: Payer: Self-pay

## 2021-02-10 ENCOUNTER — Encounter: Payer: Self-pay | Admitting: Internal Medicine

## 2021-02-10 VITALS — BP 134/77 | HR 62 | Temp 98.2°F | Ht 66.93 in | Wt 152.4 lb

## 2021-02-10 DIAGNOSIS — Z720 Tobacco use: Secondary | ICD-10-CM | POA: Diagnosis not present

## 2021-02-10 DIAGNOSIS — Z23 Encounter for immunization: Secondary | ICD-10-CM | POA: Diagnosis not present

## 2021-02-10 DIAGNOSIS — I719 Aortic aneurysm of unspecified site, without rupture: Secondary | ICD-10-CM | POA: Diagnosis not present

## 2021-02-10 DIAGNOSIS — E785 Hyperlipidemia, unspecified: Secondary | ICD-10-CM | POA: Diagnosis not present

## 2021-02-10 NOTE — Patient Instructions (Signed)
Results for Carl Allen, Carl "DAVID" (MRN [id number]) as of 02/10/2021 11:36  Ref. Range 02/04/2021 11:15  Sodium Latest Ref Range: 134 - 144 mmol/L 143  Potassium Latest Ref Range: 3.5 - 5.2 mmol/L 4.1  Chloride Latest Ref Range: 96 - 106 mmol/L 105  CO2 Latest Ref Range: 20 - 29 mmol/L 23  Glucose Latest Ref Range: 65 - 99 mg/dL 98  BUN Latest Ref Range: 8 - 27 mg/dL 17  Creatinine Latest Ref Range: 0.76 - 1.27 mg/dL 0.87  Calcium Latest Ref Range: 8.6 - 10.2 mg/dL 9.7  BUN/Creatinine Ratio Latest Ref Range: 10 - 24  20  eGFR Latest Ref Range: >59 mL/min/1.73 90  Alkaline Phosphatase Latest Ref Range: 44 - 121 IU/L 58  Albumin Latest Ref Range: 3.7 - 4.7 g/dL 4.7  Albumin/Globulin Ratio Latest Ref Range: 1.2 - 2.2  2.0  AST Latest Ref Range: 0 - 40 IU/L 12  ALT Latest Ref Range: 0 - 44 IU/L 12  Total Protein Latest Ref Range: 6.0 - 8.5 g/dL 7.1  Total Bilirubin Latest Ref Range: 0.0 - 1.2 mg/dL 0.5  Total CHOL/HDL Ratio Latest Ref Range: 0.0 - 5.0 ratio 3.7  Cholesterol, Total Latest Ref Range: 100 - 199 mg/dL 115  HDL Cholesterol Latest Ref Range: >39 mg/dL 31 (L)  Triglycerides Latest Ref Range: 0 - 149 mg/dL 157 (H)  VLDL Cholesterol Cal Latest Ref Range: 5 - 40 mg/dL 27  LDL Chol Calc (NIH) Latest Ref Range: 0 - 99 mg/dL 57  Globulin, Total Latest Ref Range: 1.5 - 4.5 g/dL 2.4  WBC Latest Ref Range: 3.4 - 10.8 x10E3/uL 6.4  RBC Latest Ref Range: 4.14 - 5.80 x10E6/uL 4.67  Hemoglobin Latest Ref Range: 13.0 - 17.7 g/dL 14.5  HCT Latest Ref Range: 37.5 - 51.0 % 43.1  MCV Latest Ref Range: 79 - 97 fL 92  MCH Latest Ref Range: 26.6 - 33.0 pg 31.0  MCHC Latest Ref Range: 31.5 - 35.7 g/dL 33.6  RDW Latest Ref Range: 11.6 - 15.4 % 13.1  Platelets Latest Ref Range: 150 - 450 x10E3/uL 202  Neutrophils Latest Ref Range: Not Estab. % 62  Immature Granulocytes Latest Ref Range: Not Estab. % 1  NEUT# Latest Ref Range: 1.4 - 7.0 x10E3/uL 4.0  Lymphocyte # Latest Ref Range: 0.7 - 3.1 x10E3/uL  1.8  Monocytes Absolute Latest Ref Range: 0.1 - 0.9 x10E3/uL 0.5  Basophils Absolute Latest Ref Range: 0.0 - 0.2 x10E3/uL 0.1  Immature Grans (Abs) Latest Ref Range: 0.0 - 0.1 x10E3/uL 0.0  Lymphs Latest Ref Range: Not Estab. % 28  Monocytes Latest Ref Range: Not Estab. % 7  Basos Latest Ref Range: Not Estab. % 1  Eos Latest Ref Range: Not Estab. % 1  EOS (ABSOLUTE) Latest Ref Range: 0.0 - 0.4 x10E3/uL 0.1

## 2021-02-10 NOTE — Progress Notes (Signed)
BP 134/77   Pulse 62   Temp 98.2 F (36.8 C) (Oral)   Ht 5' 6.93" (1.7 m)   Wt 152 lb 6.4 oz (69.1 kg)   SpO2 98%   BMI 23.92 kg/m    Subjective:    Patient ID: Carl Allen, male    DOB: [birth year], 75 y.o.   MRN: [id number]  Chief Complaint  Patient presents with   Hypertension   Hyperlipidemia   COPD    HPI: Carl Allen is a 75 y.o. 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  His neck CTA will be in 6 months  Echo NORMAL LEFT VENTRICULAR SYSTOLIC FUNCTION WITH MILD LVH NORMAL RIGHT VENTRICULAR SYSTOLIC FUNCTION TRIVIAL REGURGITATION NOTED (See above) NO VALVULAR STENOSIS ESTIMATED LVEF >55% Aortic: NORMAL GRADIENTS Mitral: TRIVIAL MR Tricuspid: TRIVIAL TR Pulmonic: TRIVIAL PI MILDY DILATED AORTIC ROOT (3.6cm) Closest EF: >55% (Estimated) LVH: MILD LVH Mitral: TRIVIAL MR Tricuspid: TRIVIAL TR  Hypertension This is a chronic problem. The current episode started more than 1 year ago. Associated symptoms include shortness of breath.  Hyperlipidemia This is a chronic problem. Associated symptoms include shortness of breath.  COPD He complains of shortness of breath. There is no chest tightness or hoarse voice. His past medical history is significant for COPD.   Chief Complaint  Patient presents with   Hypertension   Hyperlipidemia   COPD    Relevant past medical, surgical, family and social history reviewed and updated as indicated. Interim medical history since our last visit reviewed. Allergies and medications reviewed and updated.  Review of Systems  HENT:  Negative for hoarse voice.   Respiratory:  Positive for shortness of breath.    Per HPI unless specifically indicated above     Objective:    BP 134/77   Pulse 62   Temp 98.2 F (36.8 C) (Oral)   Ht 5' 6.93" (1.7 m)   Wt 152 lb 6.4 oz (69.1 kg)   SpO2 98%   BMI 23.92 kg/m   Wt Readings from Last 3 Encounters:  02/10/21 152 lb 6.4 oz (69.1 kg)  01/24/21  152 lb (68.9 kg)  01/13/21 152 lb (68.9 kg)    Physical Exam Vitals and nursing note reviewed.  Constitutional:      General: He is not in acute distress.    Appearance: Normal appearance. He is not ill-appearing or diaphoretic.  HENT:     Head: Normocephalic and atraumatic.     Right Ear: Tympanic membrane and external ear normal. There is no impacted cerumen.     Left Ear: External ear normal.     Nose: No congestion or rhinorrhea.     Mouth/Throat:     Pharynx: No oropharyngeal exudate or posterior oropharyngeal erythema.  Eyes:     Conjunctiva/sclera: Conjunctivae normal.     Pupils: Pupils are equal, round, and reactive to light.  Cardiovascular:     Rate and Rhythm: Normal rate and regular rhythm.     Heart sounds: No murmur heard.   No friction rub. No gallop.  Pulmonary:     Effort: No respiratory distress.     Breath sounds: No stridor. No wheezing or rhonchi.  Chest:     Chest wall: No tenderness.  Abdominal:     General: Abdomen is flat. Bowel sounds are normal.     Palpations: Abdomen is soft. There is no mass.     Tenderness: There is no abdominal tenderness.  Musculoskeletal:  Cervical back: Normal range of motion and neck supple. No rigidity or tenderness.     Left lower leg: No edema.  Skin:    General: Skin is warm and dry.  Neurological:     Mental Status: He is alert.    Results for orders placed or performed in visit on 02/04/21  CMP14+EGFR  Result Value Ref Range   Glucose 98 65 - 99 mg/dL   BUN 17 8 - 27 mg/dL   Creatinine, Ser 0.87 0.76 - 1.27 mg/dL   eGFR 90 >59 mL/min/1.73   BUN/Creatinine Ratio 20 10 - 24   Sodium 143 134 - 144 mmol/L   Potassium 4.1 3.5 - 5.2 mmol/L   Chloride 105 96 - 106 mmol/L   CO2 23 20 - 29 mmol/L   Calcium 9.7 8.6 - 10.2 mg/dL   Total Protein 7.1 6.0 - 8.5 g/dL   Albumin 4.7 3.7 - 4.7 g/dL   Globulin, Total 2.4 1.5 - 4.5 g/dL   Albumin/Globulin Ratio 2.0 1.2 - 2.2   Bilirubin Total 0.5 0.0 - 1.2 mg/dL    Alkaline Phosphatase 58 44 - 121 IU/L   AST 12 0 - 40 IU/L   ALT 12 0 - 44 IU/L  Lipid panel  Result Value Ref Range   Cholesterol, Total 115 100 - 199 mg/dL   Triglycerides 157 (H) 0 - 149 mg/dL   HDL 31 (L) >39 mg/dL   VLDL Cholesterol Cal 27 5 - 40 mg/dL   LDL Chol Calc (NIH) 57 0 - 99 mg/dL   Chol/HDL Ratio 3.7 0.0 - 5.0 ratio  CBC with Differential/Platelet  Result Value Ref Range   WBC 6.4 3.4 - 10.8 x10E3/uL   RBC 4.67 4.14 - 5.80 x10E6/uL   Hemoglobin 14.5 13.0 - 17.7 g/dL   Hematocrit 43.1 37.5 - 51.0 %   MCV 92 79 - 97 fL   MCH 31.0 26.6 - 33.0 pg   MCHC 33.6 31.5 - 35.7 g/dL   RDW 13.1 11.6 - 15.4 %   Platelets 202 150 - 450 x10E3/uL   Neutrophils 62 Not Estab. %   Lymphs 28 Not Estab. %   Monocytes 7 Not Estab. %   Eos 1 Not Estab. %   Basos 1 Not Estab. %   Neutrophils Absolute 4.0 1.4 - 7.0 x10E3/uL   Lymphocytes Absolute 1.8 0.7 - 3.1 x10E3/uL   Monocytes Absolute 0.5 0.1 - 0.9 x10E3/uL   EOS (ABSOLUTE) 0.1 0.0 - 0.4 x10E3/uL   Basophils Absolute 0.1 0.0 - 0.2 x10E3/uL   Immature Granulocytes 1 Not Estab. %   Immature Grans (Abs) 0.0 0.0 - 0.1 x10E3/uL        Current Outpatient Medications:    amLODipine (NORVASC) 5 MG tablet, Take 1 tablet (5 mg total) by mouth daily., Disp: 90 tablet, Rfl: 1   atorvastatin (LIPITOR) 80 MG tablet, Take 1 tablet (80 mg total) by mouth daily., Disp: 90 tablet, Rfl: 3   benazepril (LOTENSIN) 40 MG tablet, Take 1 tablet (40 mg total) by mouth daily., Disp: 90 tablet, Rfl: 1   cetirizine (ZYRTEC) 10 MG tablet, Take 10 mg by mouth daily as needed for allergies., Disp: , Rfl:    fenofibrate micronized (LOFIBRA) 134 MG capsule, Take 1 capsule (134 mg total) by mouth daily before breakfast., Disp: 90 capsule, Rfl: 1   ferrous sulfate 325 (65 FE) MG tablet, Take 325 mg by mouth daily with breakfast., Disp: , Rfl:    hydrochlorothiazide (HYDRODIURIL) 25 MG  tablet, Take 1 tablet (25 mg total) by mouth daily., Disp: 90 tablet, Rfl: 1    Multiple Vitamins-Minerals (CENTRUM SILVER 50+MEN) TABS, Take 1 tablet by mouth daily., Disp: , Rfl:    nicotine (NICODERM CQ) 21 mg/24hr patch, Place 1 patch (21 mg total) onto the skin daily., Disp: 28 patch, Rfl: 0   tiotropium (SPIRIVA HANDIHALER) 18 MCG inhalation capsule, Place 1 capsule (18 mcg total) into inhaler and inhale every morning., Disp: 90 capsule, Rfl: 1    Assessment & Plan:   Aortic aneursym fu and mx per cards / cardiothoracic . Smoking cessation  Cut back from 1 ppd - 1/2 ppd now.  Hasn't started patches yet.  Prostate cancer s/p brachytherapy seeds in 2019/. Sees urology  Dr. Erlene Quan who did the seed procedure with [facility]   Htn stable continue current meds as above.  HTN :  Continue current meds.  Medication compliance emphasised. pt advised to keep Bp logs. Pt verbalised understanding of the same. Pt to have a low salt diet . Exercise to reach a goal of at least 150 mins a week.  lifestyle modifications explained and pt understands importance of the above.    HLD  Is on Lipitor for such and fenofibrate. recheck FLP, check LFT's work on diet, SE of meds explained to pt. low fat and high fiber diet explained to pt.    Ref. Range 11/04/2020 09:41 11/04/2020 11:52 12/03/2020 14:32 02/04/2021 11:15  Total CHOL/HDL Ratio Latest Ref Range: 0.0 - 5.0 ratio 6.7 (H)   3.7  Cholesterol, Total Latest Ref Range: 100 - 199 mg/dL 215 (H)   115  HDL Cholesterol Latest Ref Range: >39 mg/dL 32 (L)   31 (L)  Triglycerides Latest Ref Range: 0 - 149 mg/dL 232 (H)   157 (H)  VLDL Cholesterol Cal Latest Ref Range: 5 - 40 mg/dL 42 (H)   27  LDL Chol Calc (NIH) Latest Ref Range: 0 - 99 mg/dL 141 (H)   57    Problem List Items Addressed This Visit   None Visit Diagnoses     Need for influenza vaccination    -  Primary   Relevant Orders   Flu Vaccine QUAD High Dose(Fluad) (Completed)        Orders Placed This Encounter  Procedures   Flu Vaccine QUAD High Dose(Fluad)     No  orders of the defined types were placed in this encounter.    Follow up plan: No follow-ups on file.

## 2021-02-14 ENCOUNTER — Telehealth: Payer: Self-pay

## 2021-02-14 NOTE — Chronic Care Management (AMB) (Signed)
  Care Management   Note  02/14/2021 Name: Carl Allen MRN: [id number] DOB: Dec 29, 1945  Carl Allen is a 75 y.o. year old male who is a primary care patient of [name], [name], MD and is actively engaged with the care management team. I reached out to [name] by phone today to assist with re-scheduling a follow up visit with the RN Case Manager  Follow up plan: Unsuccessful telephone outreach attempt made.  The care management team will reach out to the patient again over the next 7 days.  If patient returns call to provider office, please advise to call [facility]  at [place], [address], [place], [place] [address] Direct Dial: [phone] [email or web address]: [email or web address]

## 2021-02-19 NOTE — Telephone Encounter (Signed)
Patient has been rescheduled.

## 2021-02-19 NOTE — Chronic Care Management (AMB) (Signed)
  Care Management   Note  02/19/2021 Name: Carl Allen MRN: [id number] DOB: 1945/08/28  Carl Allen is a 75 y.o. year old male who is a primary care patient of [name], [name], MD and is actively engaged with the care management team. I reached out to Carl Allen by phone today to assist with re-scheduling a follow up visit with the RN Case Manager  Follow up plan: Telephone appointment with care management team member scheduled for:03/21/2021  Carl Allen, [name], [name] [facility]  [address], [place] [address] Direct Dial: [phone] [name] Website: [email or web address]

## 2021-02-20 ENCOUNTER — Other Ambulatory Visit: Payer: Self-pay

## 2021-02-20 DIAGNOSIS — C61 Malignant neoplasm of prostate: Secondary | ICD-10-CM

## 2021-02-21 ENCOUNTER — Other Ambulatory Visit: Payer: Self-pay

## 2021-02-24 ENCOUNTER — Other Ambulatory Visit: Payer: Self-pay

## 2021-02-24 ENCOUNTER — Other Ambulatory Visit: Payer: PPO

## 2021-02-24 ENCOUNTER — Ambulatory Visit (INDEPENDENT_AMBULATORY_CARE_PROVIDER_SITE_OTHER): Payer: PPO | Admitting: Licensed Clinical Social Worker

## 2021-02-24 DIAGNOSIS — L309 Dermatitis, unspecified: Secondary | ICD-10-CM

## 2021-02-24 DIAGNOSIS — C61 Malignant neoplasm of prostate: Secondary | ICD-10-CM | POA: Diagnosis not present

## 2021-02-24 DIAGNOSIS — I10 Essential (primary) hypertension: Secondary | ICD-10-CM

## 2021-02-24 DIAGNOSIS — Z720 Tobacco use: Secondary | ICD-10-CM

## 2021-02-24 DIAGNOSIS — J410 Simple chronic bronchitis: Secondary | ICD-10-CM

## 2021-02-24 DIAGNOSIS — F1020 Alcohol dependence, uncomplicated: Secondary | ICD-10-CM

## 2021-02-24 DIAGNOSIS — F419 Anxiety disorder, unspecified: Secondary | ICD-10-CM

## 2021-02-24 NOTE — Chronic Care Management (AMB) (Signed)
Chronic Care Management    Clinical Social Work Note  02/24/2021 Name: Carl Allen MRN: [id number] DOB: 09/06/1945  Carl Allen is a 75 y.o. year old male who is a primary care patient of [name], [name], MD. The CCM team was consulted to assist the patient with chronic disease management and/or care coordination needs related to: [facility] [facility].   Engaged with patient by telephone for follow up visit in response to provider referral for social work chronic care management and care coordination services.   Consent to Services:  The patient was given information about Chronic Care Management services, agreed to services, and gave verbal consent prior to initiation of services.  Please see initial visit note for detailed documentation.   Patient agreed to services and consent obtained.   Consent to Services:  The patient was given information about Care Management services, agreed to services, and gave verbal consent prior to initiation of services.  Please see initial visit note for detailed documentation.   Patient agreed to services today and consent obtained.   Assessment: Engaged with Patient by phone in response to provider referral for social work care coordination services: [facility].    Patient continues to maintain positive progress with care plan goals. He reports management of anxiety symptoms and improved cholesterol levels with medication management. Strategies to assist with tobacco cessation discussed, including how to establish SMART goals. See Care Plan below for interventions and patient self-care activities.  Recent life changes or stressors: Management of health conditions  Recommendation: Patient may benefit from, and is in agreement work with LCSW to address care coordination needs and will continue to work with the clinical team to address health care and disease management related needs.   Follow up Plan:  Patient would like continued follow-up from CCM LCSW .  per patient's request will follow up in 05/05/21.  Will call office if needed prior to next encounter.   SDOH (Social Determinants of Health) assessments and interventions performed:    Advanced Directives Status: Not addressed in this encounter.  CCM Care Plan  No Known Allergies  Outpatient Encounter Medications as of 02/24/2021  Medication Sig   amLODipine (NORVASC) 5 MG tablet Take 1 tablet (5 mg total) by mouth daily.   atorvastatin (LIPITOR) 80 MG tablet Take 1 tablet (80 mg total) by mouth daily.   benazepril (LOTENSIN) 40 MG tablet Take 1 tablet (40 mg total) by mouth daily.   cetirizine (ZYRTEC) 10 MG tablet Take 10 mg by mouth daily as needed for allergies.   fenofibrate micronized (LOFIBRA) 134 MG capsule Take 1 capsule (134 mg total) by mouth daily before breakfast.   ferrous sulfate 325 (65 FE) MG tablet Take 325 mg by mouth daily with breakfast.   hydrochlorothiazide (HYDRODIURIL) 25 MG tablet Take 1 tablet (25 mg total) by mouth daily.   Multiple Vitamins-Minerals (CENTRUM SILVER 50+MEN) TABS Take 1 tablet by mouth daily.   nicotine (NICODERM CQ) 21 mg/24hr patch Place 1 patch (21 mg total) onto the skin daily.   tiotropium (SPIRIVA HANDIHALER) 18 MCG inhalation capsule Place 1 capsule (18 mcg total) into inhaler and inhale every morning.   No facility-administered encounter medications on file as of 02/24/2021.    Patient Active Problem List   Diagnosis Date Noted   Need for influenza vaccination 02/10/2021   Aortic aneurysm without rupture ([facility]) 02/10/2021   Tobacco abuse 02/10/2021   Prostate cancer ([facility]) 08/26/2017   Advanced care planning/counseling  discussion 01/12/2017   Chronic alcoholism ([facility]) 07/14/2016   Eczema 01/08/2016   COPD (chronic obstructive pulmonary disease) ([facility]) 01/07/2015   Hyperlipidemia 01/07/2015   Essential hypertension 11/09/2014   Alcohol abuse counseling and surveillance  11/09/2014   History of atrial fibrillation 10/12/2013    Conditions to be addressed/monitored: Anxiety; Mental Health Concerns  and Substance abuse issues -  Alcohol and Tobacco  Care Plan : General Social Work (Adult)  Updates made by Rebekah [place], LCSW since 02/24/2021 12:00 AM     Problem: Coping Skills (General Plan of Care)      Goal: Coping Skills Enhanced   Start Date: 12/26/2020  This Visit's Progress: On track  Recent Progress: On track  Priority: High  Note:   Current barriers:   Level of care concerns and Substance abuse issues -  Tobacco use and hx of Alcohol Use Needs Support, Education, and Care Coordination in order to meet unmet mental health needs Clinical Goal(s): Over the next 120 days, patient will work with SW, counselor and therapist to reduce or manage symptoms of agitation, mood instability, stress, and bipolar until connected for ongoing counseling. Clinical Interventions:  Assessed patient's previous and current treatment, coping skills, support system and barriers to care  Patient interviewed and appropriate assessments performed Patient endorses feelings of anxiety triggered by diagnosis of AAA, which may result in open heart surgery. Patient has an upcoming appointment scheduled on August 11, 22 with Cardiologist 09/26: Patient reports that his recent echocardiogram "looked fairly good" although it confirmed he had AAA. Patient met with surgeon and was informed they weren't considering surgery at this time. He has scheduled a follow up appt in October 2022. Patient plans on addressing he and PCP's confusion regarding the size of the aneurysm Patient denies the need of therapy or medication management to assist with management of symptoms. Denies depression symptoms. Denies suicidal or homicidal ideation Patient resides alone and prides self on being independent. He is concerned that he will need to be placed at a SNF, if surgery is needed Patient receives  strong support from brother and sister in law, who resides in [place], [place] LCSW provided validation and encouragement. Strategies to prepare for upcoming appointment were identified. Patient agreed to have brother accompany him to the appointment and provide transportation. He agreed to list appropriate questions to discuss with provider and have brother take notes to reference later Patient reports continued cigarette smoking. States he is aware that he needs to quit in the near future. He has a box of nicotine patches to assist with tobacco cessation 09/26: Patient reports that he is still smoking "some" States he has went from smoking 1.5 a pack of cigarettes daily to .75 pack daily. Patient has nicotine patches that he plans to use once he reduces the amount of smoking CCM LCSW discussed SMART goals and strategies to assist patient in reaching his goals of tobacco cessation. Patient continues to  "make a conscious effort to not to smoke as much" Patient shared that since sobriety from alcohol, he feels better and has an improved sleep hygiene. Patient shared that he remains motivated to stay sober 09/26: Patient reports occasional difficulty going to sleep when his eczema flares up. Patient utilizes body lotion that assists and agreed to address with PCP at upcoming appt, if symptoms have not resolved Patient reports compliance with choloestrol medicine 09/26: Patient reports compliance with medications which has significantly improved his cholesterol levels, per patient CCM LCSW reviewed upcoming appointments.  Patient keeps AVS to assist him with keeping track of appointments Mindfulness or Relaxation Training, Active listening / Reflection utilized , Emotional Supportive Provided, Psychoeducation for mental health needs , and Verbalization of feelings encouraged  ; Discussed plans with patient for ongoing care management follow up and provided patient with direct contact information for care  management team Collaboration with PCP regarding development and update of comprehensive plan of care as evidenced by provider attestation and co-signature Inter-disciplinary care team collaboration (see longitudinal plan of care) Patient Goals/Self-Care Activities: Over the next 120 days Attend all scheduled medical appointments Contact clinic with any questions or concerns Utilize strategies discussed to assist with stress management       Christa See, MSW, [email or web address] Phone [phone] 4:41 PM

## 2021-02-24 NOTE — Patient Instructions (Signed)
Visit Information   Goals Addressed             This Visit's Progress    Manage Stress regarding health   On track    Timeframe:  Long-Range Goal Priority:  High Start Date:     12/26/20                        Expected End Date:  05/31/21                     Follow Up Date 03/05/21    Patient Goals/Self-Care Activities: Over the next 120 days Attend all scheduled medical appointments Contact clinic with any questions or concerns Utilize strategies discussed to assist with stress management        Patient verbalizes understanding of instructions provided today.   Telephone follow up appointment with care management team member scheduled for:05/05/21  Christa See, MSW, [email or web address] Phone [phone] 4:44 PM

## 2021-02-25 LAB — PSA: Prostate Specific Ag, Serum: 0.5 ng/mL (ref 0.0–4.0)

## 2021-02-26 NOTE — Progress Notes (Signed)
03/17/21 2:11 PM   Carl Allen 04-23-1946 [id number]  Referring provider:  Charlynne Cousins, MD [address] [place],  [place] [address]  Chief Complaint  Patient presents with   Benign Prostatic Hypertrophy    Urological history: 1.  Prostate cancer -PSA 0.5 in 02/24/2021 -intermediate risk prostate cancer  -initially diagnosed with low risk prostate cancer in 2018 -repeat biopsy which showed a more aggressive, Gleason 3+4 disease and 2 of the cores -brachytherapy seeds in 12/2017    2. LU TS -I PSS 8/1    HPI: Carl Allen is a 75 y.o.male who presents today for 6 month follow-up.  He is doing well today. Patient denies any modifying or aggravating factors.  Patient denies any gross hematuria, dysuria or suprapubic/flank pain.  Patient denies any fevers, chills, nausea or vomiting.     IPSS     Row Name 02/27/21 1100         International Prostate Symptom Score   How often have you had the sensation of not emptying your bladder? Less than half the time     How often have you had to urinate less than every two hours? About half the time     How often have you found you stopped and started again several times when you urinated? Not at All     How often have you found it difficult to postpone urination? Less than 1 in 5 times     How often have you had a weak urinary stream? Less than 1 in 5 times     How often have you had to strain to start urination? Less than 1 in 5 times     How many times did you typically get up at night to urinate? None     Total IPSS Score 8           Quality of Life due to urinary symptoms   If you were to spend the rest of your life with your urinary condition just the way it is now how would you feel about that? Pleased              Score:  1-7 Mild 8-19 Moderate 20-35 Severe    PMH: Past Medical History:  Diagnosis Date   Allergy    Anxiety    not often   Arthritis    fingers, knees   Cancer ([place])    Cataract    Chronic  alcoholism ([place])    COPD (chronic obstructive pulmonary disease) ([place])    Depression    pt states sometimes   Dyspnea    Emphysema of lung ([facility])    GERD (gastroesophageal reflux disease)    pt states every now and then   Hypertension    Hypertriglyceridemia     Surgical History: Past Surgical History:  Procedure Laterality Date   CATARACT EXTRACTION, BILATERAL  2014   COLONOSCOPY  2012   CYSTOSCOPY N/A 01/10/2018   Procedure: CYSTOSCOPY;  Surgeon: Hollice Espy, MD;  Location: [facility] ORS;  Service: Urology;  Laterality: N/A;   RADIOACTIVE SEED IMPLANT N/A 01/10/2018   Procedure: RADIOACTIVE SEED IMPLANT/BRACHYTHERAPY IMPLANT;  Surgeon: Hollice Espy, MD;  Location: [facility] OR[facility];  Service: Urology;  Laterality: N/A;   TONSILLECTOMY     TONSILLECTOMY AND ADENOIDECTOMY  1952    Home Medications:  Allergies as of 02/27/2021   No Known Allergies      Medication List        Accurate as of February 27, 2021 11:59 PM. If you have any questions, ask your nurse or doctor.          amLODipine 5 MG tablet Commonly known as: NORVASC Take 1 tablet (5 mg total) by mouth daily.   atorvastatin 80 MG tablet Commonly known as: LIPITOR Take 1 tablet (80 mg total) by mouth daily.   benazepril 40 MG tablet Commonly known as: LOTENSIN Take 1 tablet (40 mg total) by mouth daily.   Centrum Silver 50+Men Tabs Take 1 tablet by mouth daily.   cetirizine 10 MG tablet Commonly known as: ZYRTEC Take 10 mg by mouth daily as needed for allergies.   fenofibrate micronized 134 MG capsule Commonly known as: LOFIBRA Take 1 capsule (134 mg total) by mouth daily before breakfast.   ferrous sulfate 325 (65 FE) MG tablet Take 325 mg by mouth daily with breakfast.   hydrochlorothiazide 25 MG tablet Commonly known as: HYDRODIURIL Take 1 tablet (25 mg total) by mouth daily.   nicotine 21 mg/24hr patch Commonly known as: Nicoderm CQ Place 1 patch (21 mg total) onto the skin daily.   Spiriva  HandiHaler 18 MCG inhalation capsule Generic drug: tiotropium Place 1 capsule (18 mcg total) into inhaler and inhale every morning.        Allergies:  No Known Allergies  Family History: Family History  Problem Relation Age of Onset   Arthritis Mother    Heart disease Mother    Hearing loss Mother    Hyperlipidemia Mother    Hypertension Mother    Kidney disease Mother    Miscarriages / [place] Mother    Vision loss Mother    Stroke Father    Heart disease Maternal Grandmother    Stroke Maternal Grandfather    Cancer Paternal Grandfather     Social History:  reports that he has been smoking cigarettes. He has a 28.50 pack-year smoking history. He has never used smokeless tobacco. He reports current alcohol use of about 3.0 standard drinks per week. He reports that he does not use drugs.   Physical Exam: BP 131/81   Pulse 71   Ht _0  (1.702 m)   Wt 152 lb (68.9 kg)   BMI 23.81 kg/m   Constitutional:  Alert and oriented, No acute distress. HEENT: [place] AT, mask in place.  Trachea midline Cardiovascular: No clubbing, cyanosis, or edema. Respiratory: Normal respiratory effort, no increased work of breathing. Neurologic: Grossly intact, no focal deficits, moving all 4 extremities. Psychiatric: Normal mood and affect.  Laboratory Data: Lab Results  Component Value Date   CREATININE 0.87 02/04/2021   Component     Latest Ref Rng & Units 02/04/2021          WBC     3.4 - 10.8 x10E3/uL 6.4  RBC     4.14 - 5.80 x10E6/uL 4.67  Hemoglobin     13.0 - 17.7 g/dL 14.5  HCT     37.5 - 51.0 % 43.1  MCV     79 - 97 fL 92  MCH     26.6 - 33.0 pg 31.0  MCHC     31.5 - 35.7 g/dL 33.6  RDW     11.6 - 15.4 % 13.1  Platelets     150 - 450 x10E3/uL 202  Neutrophils     Not Estab. % 62  Lymphs     Not Estab. % 28  Monocytes     Not Estab. % 7  Eos     Not  Estab. % 1  Basos     Not Estab. % 1  NEUT#     1.4 - 7.0 x10E3/uL 4.0  Lymphocyte #     0.7 - 3.1  x10E3/uL 1.8  Monocytes Absolute     0.1 - 0.9 x10E3/uL 0.5  EOS (ABSOLUTE)     0.0 - 0.4 x10E3/uL 0.1  Basophils Absolute     0.0 - 0.2 x10E3/uL 0.1  Immature Granulocytes     Not Estab. % 1  Immature Grans (Abs)     0.0 - 0.1 x10E3/uL 0.0  WBC, UA     0 - 5 /hpf   Epithelial Cells (non renal)     0 - 10 /hpf   Bacteria, UA     None seen/Few    Component     Latest Ref Rng & Units 02/04/2021  Cholesterol, Total     100 - 199 mg/dL 115  Triglycerides     0 - 149 mg/dL 157 (H)  HDL Cholesterol     >39 mg/dL 31 (L)  VLDL Cholesterol Cal     5 - 40 mg/dL 27  LDL Chol Calc (NIH)     0 - 99 mg/dL 57  Total CHOL/HDL Ratio     0.0 - 5.0 ratio 3.7   Component     Latest Ref Rng & Units 02/04/2021  Glucose     65 - 99 mg/dL 98  BUN     8 - 27 mg/dL 17  Creatinine     0.76 - 1.27 mg/dL 0.87  eGFR     >59 mL/min/1.73 90  BUN/Creatinine Ratio     10 - 24 20  Sodium     134 - 144 mmol/L 143  Potassium     3.5 - 5.2 mmol/L 4.1  Chloride     96 - 106 mmol/L 105  CO2     20 - 29 mmol/L 23  Calcium     8.6 - 10.2 mg/dL 9.7  Total Protein     6.0 - 8.5 g/dL 7.1  Albumin     3.7 - 4.7 g/dL 4.7  Globulin, Total     1.5 - 4.5 g/dL 2.4  Albumin/Globulin Ratio     1.2 - 2.2 2.0  Total Bilirubin     0.0 - 1.2 mg/dL 0.5  Alkaline Phosphatase     44 - 121 IU/L 58  AST     0 - 40 IU/L 12  ALT     0 - 44 IU/L 12   Component     Latest Ref Rng & Units 11/04/2020  TIBC     250 - 450 ug/dL 333  UIBC     111 - 343 ug/dL 261  Iron     38 - 169 ug/dL 72  Iron Saturation     15 - 55 % 22    Component     Latest Ref Rng & Units 11/04/2020  Ferritin     30 - 400 ng/mL 494 (H)    Component     Latest Ref Rng & Units 11/04/2020  TSH     0.450 - 4.500 uIU/mL 2.830   Component Prostate Specific Ag, Serum  Latest Ref Rng & Units 0.0 - 4.0 ng/mL  01/12/2017 5.0 (H)  01/26/2018 12.6 (H)  02/23/2020 0.4  08/27/2020 0.5  02/24/2021 0.5     Assessment & Plan:   1.  Prostate cancer - PSA has remained stable  - PSA in 6  months - appointment with Dr. Donella Stade in 08/2021     2. LUTS  - long standing frequency  - not bothersome to patient   Follow-up in 6 moths for PSA only and in 1 year for office visit with Zara Council, PA-C.   [facility] [address] [place], [place] [address] [phone]  [facility] as a scribe for [facility], PA-C.,have documented all relevant documentation on the behalf of Jurgen Groeneveld, PA-C,as directed by  [facility], PA-C while in the presence of Alyssha Housh, PA-C.  I have reviewed the above documentation for accuracy and completeness, and I agree with the above.    Zara Council, PA-C

## 2021-02-27 ENCOUNTER — Ambulatory Visit: Payer: PPO | Admitting: Urology

## 2021-02-27 ENCOUNTER — Other Ambulatory Visit: Payer: Self-pay

## 2021-02-27 VITALS — BP 131/81 | HR 71 | Ht 67.0 in | Wt 152.0 lb

## 2021-02-27 DIAGNOSIS — C61 Malignant neoplasm of prostate: Secondary | ICD-10-CM | POA: Diagnosis not present

## 2021-02-27 DIAGNOSIS — R399 Unspecified symptoms and signs involving the genitourinary system: Secondary | ICD-10-CM | POA: Diagnosis not present

## 2021-02-28 DIAGNOSIS — I10 Essential (primary) hypertension: Secondary | ICD-10-CM | POA: Diagnosis not present

## 2021-02-28 DIAGNOSIS — J410 Simple chronic bronchitis: Secondary | ICD-10-CM | POA: Diagnosis not present

## 2021-03-17 ENCOUNTER — Encounter: Payer: Self-pay | Admitting: Urology

## 2021-03-20 DIAGNOSIS — J42 Unspecified chronic bronchitis: Secondary | ICD-10-CM | POA: Diagnosis not present

## 2021-03-20 DIAGNOSIS — E785 Hyperlipidemia, unspecified: Secondary | ICD-10-CM | POA: Diagnosis not present

## 2021-03-20 DIAGNOSIS — I480 Paroxysmal atrial fibrillation: Secondary | ICD-10-CM | POA: Diagnosis not present

## 2021-03-20 DIAGNOSIS — R55 Syncope and collapse: Secondary | ICD-10-CM | POA: Diagnosis not present

## 2021-03-20 DIAGNOSIS — I10 Essential (primary) hypertension: Secondary | ICD-10-CM | POA: Diagnosis not present

## 2021-03-21 ENCOUNTER — Telehealth: Payer: PPO

## 2021-03-21 ENCOUNTER — Telehealth: Payer: Self-pay

## 2021-03-21 NOTE — Telephone Encounter (Signed)
  Care Management   Follow Up Note   03/21/2021 Name: Carl Allen MRN: [id number] DOB: [birth year]   Referred by: Charlynne Cousins, MD Reason for referral : Chronic Care Management (RNCM: Follow up for Chronic disease Management and Care Coordination Needs )   An unsuccessful telephone outreach was attempted today. The patient was referred to the case management team for assistance with care management and care coordination.   Follow Up Plan: A HIPPA compliant phone message was left for the patient providing contact information and requesting a return call.  Noreene Larsson RN, MSN, [facility] Mobile: [phone]

## 2021-04-02 ENCOUNTER — Telehealth: Payer: Self-pay

## 2021-04-02 ENCOUNTER — Telehealth: Payer: PPO | Admitting: General Practice

## 2021-04-02 ENCOUNTER — Ambulatory Visit (INDEPENDENT_AMBULATORY_CARE_PROVIDER_SITE_OTHER): Payer: PPO

## 2021-04-02 DIAGNOSIS — F419 Anxiety disorder, unspecified: Secondary | ICD-10-CM

## 2021-04-02 DIAGNOSIS — E7800 Pure hypercholesterolemia, unspecified: Secondary | ICD-10-CM

## 2021-04-02 DIAGNOSIS — I10 Essential (primary) hypertension: Secondary | ICD-10-CM

## 2021-04-02 DIAGNOSIS — J410 Simple chronic bronchitis: Secondary | ICD-10-CM

## 2021-04-02 DIAGNOSIS — E785 Hyperlipidemia, unspecified: Secondary | ICD-10-CM

## 2021-04-02 DIAGNOSIS — J449 Chronic obstructive pulmonary disease, unspecified: Secondary | ICD-10-CM

## 2021-04-02 DIAGNOSIS — I719 Aortic aneurysm of unspecified site, without rupture: Secondary | ICD-10-CM

## 2021-04-02 DIAGNOSIS — Z720 Tobacco use: Secondary | ICD-10-CM

## 2021-04-02 DIAGNOSIS — F17200 Nicotine dependence, unspecified, uncomplicated: Secondary | ICD-10-CM

## 2021-04-02 NOTE — Telephone Encounter (Signed)
  Care Management   Follow Up Note   04/02/2021 Name: Carl Allen MRN: [id number] DOB: 1945/08/07   Referred by: Charlynne Cousins, MD Reason for referral : Chronic Care Management (RNCM: Follow up for Chronic Disease Management and Care Coordination Needs)   Call completed. Was able to speak with the patient. See new encounter.   Follow Up Plan: Telephone follow up appointment with care management team member scheduled for: 06-04-2021 at 1:45 pm  [place], MSN, [facility] Mobile: [phone]

## 2021-04-02 NOTE — Patient Instructions (Signed)
Visit Information  The patient verbalized understanding of instructions, educational materials, and care plan provided today and declined offer to receive copy of patient instructions, educational materials, and care plan.   Telephone follow up appointment with care management team member scheduled for: 06-04-2021 at 1:45 pm  [place], MSN, [facility] Mobile: [phone]

## 2021-04-02 NOTE — Chronic Care Management (AMB) (Signed)
Chronic Care Management   CCM RN Visit Note  04/02/2021 Name: Carl Allen MRN: [id number] DOB: [birth year]  Subjective: Carl Allen is a 75 y.o. year old male who is a primary care patient of [name], [name], MD. The care management team was consulted for assistance with disease management and care coordination needs.    Engaged with patient by telephone for follow up visit in response to provider referral for case management and/or care coordination services.   Consent to Services:  The patient was given information about Chronic Care Management services, agreed to services, and gave verbal consent prior to initiation of services.  Please see initial visit note for detailed documentation.   Patient agreed to services and verbal consent obtained.   Assessment: Review of patient past medical history, allergies, medications, health status, including review of consultants reports, laboratory and other test data, was performed as part of comprehensive evaluation and provision of chronic care management services.   SDOH (Social Determinants of Health) assessments and interventions performed:    CCM Care Plan  No Known Allergies  Outpatient Encounter Medications as of 04/02/2021  Medication Sig   amLODipine (NORVASC) 5 MG tablet Take 1 tablet (5 mg total) by mouth daily.   atorvastatin (LIPITOR) 80 MG tablet Take 1 tablet (80 mg total) by mouth daily.   benazepril (LOTENSIN) 40 MG tablet Take 1 tablet (40 mg total) by mouth daily.   cetirizine (ZYRTEC) 10 MG tablet Take 10 mg by mouth daily as needed for allergies.   fenofibrate micronized (LOFIBRA) 134 MG capsule Take 1 capsule (134 mg total) by mouth daily before breakfast.   ferrous sulfate 325 (65 FE) MG tablet Take 325 mg by mouth daily with breakfast.   hydrochlorothiazide (HYDRODIURIL) 25 MG tablet Take 1 tablet (25 mg total) by mouth daily.   Multiple Vitamins-Minerals (CENTRUM SILVER 50+MEN) TABS Take 1 tablet by mouth daily.    nicotine (NICODERM CQ) 21 mg/24hr patch Place 1 patch (21 mg total) onto the skin daily.   tiotropium (SPIRIVA HANDIHALER) 18 MCG inhalation capsule Place 1 capsule (18 mcg total) into inhaler and inhale every morning.   No facility-administered encounter medications on file as of 04/02/2021.    Patient Active Problem List   Diagnosis Date Noted   Need for influenza vaccination 02/10/2021   Aortic aneurysm without rupture ([place]) 02/10/2021   Tobacco abuse 02/10/2021   Prostate cancer ([place]) 08/26/2017   Advanced care planning/counseling discussion 01/12/2017   Chronic alcoholism ([place]) 07/14/2016   Eczema 01/08/2016   COPD (chronic obstructive pulmonary disease) ([place]) 01/07/2015   Hyperlipidemia 01/07/2015   Essential hypertension 11/09/2014   Alcohol abuse counseling and surveillance 11/09/2014   History of atrial fibrillation 10/12/2013    Conditions to be addressed/monitored:HTN, HLD, COPD, Anxiety, Tobacco Use, and AAA  Care Plan : RNCM: Management of HLD  Updates made by Vanita Ingles, RN since 04/02/2021 12:00 AM  Completed 04/02/2021   Problem: RNCM: Management of HLD Resolved 04/02/2021  Priority: Medium     Long-Range Goal: RNCM: Management of HLD Completed 04/02/2021  Start Date: 07/23/2020  Expected End Date: 12/15/2021  Recent Progress: On track  Priority: High  Note:   Current Barriers: Resolving, duplicate goal  Poorly controlled hyperlipidemia, complicated by HTN, smoker, ETOH use Current antihyperlipidemic regimen: fenofibrate 134 mg daily, atorvastatin 80 mg QD- started at last MD visit on 12-04-2020 Most recent lipid panel:  Lab Results  Component Value Date   CHOL 215 (H) 11/04/2020   CHOL  209 (H) 03/12/2020   CHOL 209 (H) 09/04/2019   Lab Results  Component Value Date   HDL 32 (L) 11/04/2020   HDL 29 (L) 03/12/2020   HDL 33 (L) 09/04/2019   Lab Results  Component Value Date   LDLCALC 141 (H) 11/04/2020   LDLCALC 107 (H) 03/12/2020   LDLCALC 123 (H)  09/04/2019   Lab Results  Component Value Date   TRIG 232 (H) 11/04/2020   TRIG 425 (H) 03/12/2020   TRIG 297 (H) 09/04/2019   Lab Results  Component Value Date   CHOLHDL 6.7 (H) 11/04/2020   CHOLHDL 5.8 (H) 01/12/2017   CHOLHDL 7.2 (H) 01/07/2015   No results found for: LDLDIRECT  ASCVD risk enhancing conditions: age >24,  HTN, current smoker, Ascending aortic aneurysm  Lacks social connections Does not contact provider office for questions/concerns  RN Care Manager Clinical Goal(s):  Over the next 240 days, patient will work with Consulting civil engineer, providers, and care team towards execution of optimized self-health management plan patient will verbalize understanding of plan for effective management of HLD  patient will [facility] with [facility] and pcp to address needs related to management of HLD  patient will attend all scheduled medical appointments: pcp on 02-04-2021 at 10 am   Interventions: Collaboration with Charlynne Cousins, MD regarding development and update of comprehensive plan of care as evidenced by provider attestation and co-signature Inter-disciplinary care team collaboration (see longitudinal plan of care) Medication review performed; medication list updated in electronic medical record.  Inter-disciplinary care team collaboration (see longitudinal plan of care) Referred to pharmacy team for assistance with HLD medication management Evaluation of current treatment plan related to HLD and patient's adherence to plan as established by provider. 10-18-2020: The patient is doing well and is actually eating healthier than normal. States he is cutting down on his alcohol use and praised the patient for this accomplishment. He wants to maintain his health and well being. Has lost weight and is now down to 151/152 pounds. 12-20-2020: The patient states that he has stopped drinking completely. He read up on the cholesterol medications and decided that it was time to leave it alone completely.  He has patches to use for smoking cessation but has not started using them. Education on small and steady steps to quit smoking. He says he is tired since taking the cholesterol medications but he is not having any muscule pain. Education and support given. Will continue to monitor.  Advised patient to to call the office for changes in condition or questions. 10-18-2020: Encouraged the patient to write down questions to ask the pcp on his upcoming visit on 11-04-2020. 12-20-2020: Extensive education today on health and well being. The patient is anxious about new findings with AAA but he is confident in his plan of care and health team. Sees specialist on 01-09-2021 and pcp again on 02-04-2021 Provided education to patient re: heart healthy diet, portion sizes, managing weight and stress levels. 10-18-2020: The patient received his information in the mail and is appreciative. The patient states he has been reading up on the information. Also had follow up for his prostate cancer and states that he is "cancer free" or in "remission".  Reviewed medications with patient and discussed compliance. 12-20-2020: The patient is compliant with medications and heart healthy diet. Making positive changes with ETOH use and smoking.  Provided patient with heart healthy diet  educational materials related to effective management of HLD and other cardiac conditions. 12-20-2020: Review of  education and new concerns. The patient states he is optimistic and confident in his health care team.  Discussed plans with patient for ongoing care management follow up and provided patient with direct contact information for care management team   Patient Goals/Self-Care Activities: Over the next 120 days, patient will:   - call for medicine refill 2 or 3 days before it runs out - call if I am sick and can't take my medicine - keep a list of all the medicines I take; vitamins and herbals too - learn to read medicine labels - use a pillbox to  sort medicine - use an alarm clock or phone to remind me to take my medicine - change to whole grain breads, cereal, pasta - drink 6 to 8 glasses of water each day - eat 3 to 5 servings of fruits and vegetables each day - eat 5 or 6 small meals each day - fill half the plate with nonstarchy vegetables - limit fast food meals to no more than 1 per week - manage portion size - prepare main meal at home 3 to 5 days each week - be open to making changes - I can manage, know and watch for signs of a heart attack - if I have chest pain, call for help - learn about small changes that will make a big difference - learn my personal risk factors - barriers to meeting goals identified - change-talk evoked - choices provided - collaboration with team encouraged - decision-making supported - difficulty of making life-long changes acknowledged - health risks reviewed - problem-solving facilitated - questions answered - readiness for change evaluated - reassurance provided - self-reflection promoted - self-reliance encouraged - verbalization of feelings encouraged  Follow Up Plan: Telephone follow up appointment with care management team member scheduled for: 02-05-2021 at 945 am      Care Plan : RNCM: Hypertension (Adult)  Updates made by Vanita Ingles, RN since 04/02/2021 12:00 AM  Completed 04/02/2021   Problem: RMCM: Hypertension (Hypertension) Resolved 04/02/2021  Priority: Medium     Long-Range Goal: RNCM: Hypertension Monitored Completed 04/02/2021  Start Date: 07/23/2020  Expected End Date: 12/15/2021  Recent Progress: On track  Priority: High  Note:   Objective: resolving, duplicate goal  Last practice recorded BP readings:  BP Readings from Last 3 Encounters:  12/04/20 126/81  11/04/20 126/79  09/18/20 134/82   Most recent eGFR/CrCl:  Lab Results  Component Value Date   EGFR 90 11/04/2020    No components found for: CRCL Current Barriers:  Knowledge Deficits related  to basic understanding of hypertension pathophysiology and self care management Knowledge Deficits related to understanding of medications prescribed for management of hypertension Limited Social Support Unable to independently HTN Case Manager Clinical Goal(s):  patient will verbalize understanding of plan for hypertension management patient will attend all scheduled medical appointments: 02-04-2021 at 10 am patient will demonstrate improved adherence to prescribed treatment plan for hypertension as evidenced by taking all medications as prescribed, monitoring and recording blood pressure as directed, adhering to low sodium/DASH diet patient will demonstrate improved health management independence as evidenced by checking blood pressure as directed and notifying PCP if SBP>160 or DBP > 90, taking all medications as prescribe, and adhering to a low sodium diet as discussed. patient will verbalize basic understanding of hypertension disease process and self health management plan as evidenced by compliance with heart healthy diet, compliance with medications and working with the CCM team to effectively manage health and  well being Interventions:  Collaboration with [name], [name], MD regarding development and update of comprehensive plan of care as evidenced by provider attestation and co-signature Inter-disciplinary care team collaboration (see longitudinal plan of care) Evaluation of current treatment plan related to hypertension self management and patient's adherence to plan as established by provider. Provided education to patient re: stroke prevention, s/s of heart attack and stroke, DASH diet, complications of uncontrolled blood pressure Reviewed medications with patient and discussed importance of compliance Discussed plans with patient for ongoing care management follow up and provided patient with direct contact information for care management team Advised patient, providing education and  rationale, to monitor blood pressure daily and record, calling PCP for findings outside established parameters.  Reviewed scheduled/upcoming provider appointments including: 02-04-2021 at 10 am Self-Care Activities: - Self administers medications as prescribed Attends all scheduled provider appointments Calls provider office for new concerns, questions, or BP outside discussed parameters Checks BP and records as discussed Follows a low sodium diet/DASH diet Patient Goals: - check blood pressure 3 times per week - choose a place to take my blood pressure (home, clinic or office, retail store) - write blood pressure results in a log or diary - agree on reward when goals are met - agree to work together to make changes - ask questions to understand - have a family meeting to talk about healthy habits - learn about high blood pressure  Follow Up Plan: Telephone follow up appointment with care management team member scheduled for: 02-05-2021 at 0945 am    Care Plan : RNCM: COPD (Adult)  Updates made by Vanita Ingles, RN since 04/02/2021 12:00 AM  Completed 04/02/2021   Problem: RNCM: Psychological Adjustment to Diagnosis (COPD) Resolved 04/02/2021  Priority: Medium     Long-Range Goal: RNCM: Adjustment to Disease Achieved Completed 04/02/2021  Start Date: 07/23/2020  Expected End Date: 12/15/2021  Recent Progress: On track  Priority: Medium  Note:   Current Barriers: resolving duplicate goal Knowledge deficits related to basic understanding of COPD disease process Knowledge deficits related to basic COPD self care/management Knowledge deficit related to basic understanding of how to use inhalers and how inhaled medications work Knowledge deficit related to importance of energy conservation Unable to independently manage COPD Lacks social connections  Case Manager Clinical Goal(s): patient will report using inhalers as prescribed including rinsing mouth after use patient will report  utilizing pursed lip breathing for shortness of breath patient will verbalize understanding of COPD action plan and when to seek appropriate levels of medical care patient will engage in lite exercise as tolerated to build/regain stamina and strength and reduce shortness of breath through activity tolerance patient will verbalize basic understanding of COPD disease process and self care activities patient will not be hospitalized for COPD exacerbation as evidenced  Interventions:  Collaboration with [name], [name], MD regarding development and update of comprehensive plan of care as evidenced by provider attestation and co-signature Inter-disciplinary care team collaboration (see longitudinal plan of care) Provided patient with basic written and verbal COPD education on self care/management/and exacerbation prevention  Provided patient with COPD action plan and reinforced importance of daily self assessment Provided written and verbal instructions on pursed lip breathing and utilized returned demonstration as teach back Provided instruction about proper use of medications used for management of COPD including inhalers Advised patient to self assesses COPD action plan zone and make appointment with provider if in the yellow zone for 48 hours without improvement. Provided patient with education about the  role of exercise in the management of COPD Advised patient to engage in light exercise as tolerated 3-5 days a week Provided education about and advised patient to utilize infection prevention strategies to reduce risk of respiratory infection  Self-Care Activities:  Patient verbalizes understanding of plan to effectively manage COPD Self administers medications as prescribed Attends all scheduled provider appointments Calls pharmacy for medication refills Attends church or other social activities Performs ADL's independently Performs IADL's independently Calls provider office for new concerns or  questions Patient Goals: - do breathing exercises at least 2 times each day - do exercises in a comfortable position that makes breathing as easy as possible - develop a new routine to improve sleep - don't eat or exercise right before bedtime - eat healthy - get at least 7 to 8 hours of sleep at night - get outdoors every day (weather permitting) - keep room cool and dark - limit daytime naps - practice relaxation or meditation daily - use a fan or white noise in bedroom - use devices that will help like a cane, sock-puller or reacher - begin a symptom diary - bring symptom diary to all visits - develop a rescue plan - eliminate symptom triggers at home - follow rescue plan if symptoms flare-up - keep follow-up appointments - use an extra pillow to sleep - avoid second hand smoke - eliminate smoking in my home - identify and avoid work-related triggers - identify and remove indoor air pollutants - limit outdoor activity during cold weather - listen for public air quality announcements every day Follow Up Plan: Telephone follow up appointment with care management team member scheduled for: 02-05-2021 at 0945 am    Problem: RNCM: Smoking Education and support Resolved 04/02/2021  Priority: High     Long-Range Goal: RNCM: Smoking Cessation education and support Completed 04/02/2021  Start Date: 07/23/2020  Expected End Date: 12/15/2021  Recent Progress: On track  Priority: High  Note:   Current Barriers: resolving, duplicate goal Unable to independently stop smoking  Lacks social connections Does not contact provider office for questions/concerns 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 years years; currently smoking 3/4 ppd Previous quit attempts, unsuccessful not really ever tried successful using none Reports smoking within 30 minutes of waking up Reports triggers to smoke include: stress, changes in social environment  Reports motivation to quit smoking includes: knows this would be better  for his health and well bein g On a scale of 1-10, reports MOTIVATION to quit is 7 On a scale of 1-10, reports CONFIDENCE in quitting is 5 Clinical Goal(s):  Over the next 120 days, patient will work with Chief Strategy Officer and provider towards tobacco cessation  Interventions: Collaboration with [name], [name], MD regarding development and update of comprehensive plan of care as evidenced by provider attestation and co-signature Inter-disciplinary care team collaboration (see longitudinal plan of care) Evaluation of current treatment plan reviewed. The patient states he is cutting back. Was smoking a pack and a half, is now down to 3/4 a pack a day. Also is working on decrease of ETOH consumption. States today (07-23-2020) that he has not had anything to drink in 3 weeks. 10-18-2020: The patient states that he is doing well. He states he has cut back on his drinking.. Praised for great progress. 12-20-2020: The patient states he has stopped drinking x 2 weeks and has decided he does not need any more ETOH. He has patches for smoking cessation but admits he has not started using them yet.  Education on slow and steady and working toward quitting as his goal. The patient praised for positive changes. He has new health concerns and he wants to stop smoking as well. Education and support given.  Provided contact information for [place] [facility] (1-800-QUIT-NOW). Patient knows this is a resource available  Discussed plans with patient for ongoing care management follow up and provided patient with direct contact information for care management team Provided patient with printed smoking cessation educational materials Reviewed scheduled/upcoming provider appointments including: 02-04-2021 at 1000 am Evaluation of current treatment plan reviewed Patient Goals/Self-Care Activities Over the next 120 days, patient will:  - alternative activities  as a habit during cravings  - verbally commit to reducing tobacco  consumption Follow Up Plan: Telephone follow up appointment with care management team member scheduled for: 02-05-2021 at 945 am    Care Plan : RNCM: Management of AAA ascending aortic aneurysm  Updates made by Vanita Ingles, RN since 04/02/2021 12:00 AM  Completed 04/02/2021   Problem: RNCM: Management of AAA Resolved 04/02/2021  Priority: High  Onset Date: 12/20/2020     Long-Range Goal: RNCM: Management of AAA Completed 04/02/2021  Start Date: 12/20/2020  Expected End Date: 07/08/2021  Recent Progress: On track  Priority: High  Note:   Current Barriers: resolving, duplicate goal Ineffective Self Health Maintenance in a patient with AAA- ascending aortic aneurysm  Unable to independently effectively manage AAA- ascending aortic aneurysm  Lacks social connections Clinical Goal(s):  Collaboration with [name], [name], MD regarding development and update of comprehensive plan of care as evidenced by provider attestation and co-signature Inter-disciplinary care team collaboration (see longitudinal plan of care) patient will work with care management team to address care coordination and chronic disease management needs related to Disease Management Educational Needs Care Coordination Mental Health Counseling Substance Abuse Counseling Level of Care Concerns   Interventions:  Evaluation of current treatment plan related to AAA- ascending aortic aneurysm , Level of care concerns and Mental Health Concerns  self-management and patient's adherence to plan as established by provider. Collaboration with Charlynne Cousins, MD regarding development and update of comprehensive plan of care as evidenced by provider attestation       and co-signature Inter-disciplinary care team collaboration (see longitudinal plan of care) Discussed plans with patient for ongoing care management follow up and provided patient with direct contact information for care management team Self Care Activities:  Patient verbalizes  understanding of plan to effectively manage AAA- ascending aortic aneurysm  Self administers medications as prescribed Attends all scheduled provider appointments Calls pharmacy for medication refills Attends church or other social activities Performs ADL's independently Performs IADL's independently Calls provider office for new concerns or questions Patient Goals: Work with the [employer] concerning new anxiety of AAA Keep appointments Call office for changes on new concerns Take medications as prescribed Have plan of care in place for AAA of 4.8 Refrain from ETOH use Stop smoking Work with the CCM team to optimize health and well being.  Follow Up Plan: Telephone follow up appointment with care management team member scheduled for: 02-05-2021 at 0945 am    Care Plan : RNCM: General Plan of Care (Adult) for Chronic Disease Management and Care Coordination Needs  Updates made by Vanita Ingles, RN since 04/02/2021 12:00 AM     Problem: RNCM: Development of Plan of Care for Chronic Disease Management (HTN, HLD, COPD, AAA, Anxiety, Smoker)   Priority: High     Long-Range Goal: RNCM: Effective Management of  Plan of Care for Chronic Disease Management (HTN, HLD, COPD, AAA, Anxiety, Smoker)   Start Date: 04/02/2021  Expected End Date: 04/02/2022  Priority: High  Note:   Current Barriers:  Knowledge Deficits related to plan of care for management of HTN, HLD, COPD, Anxiety with Excessive Worry,, and AAA and smoker   Chronic Disease Management support and education needs related to HTN, HLD, COPD, Anxiety with Excessive Worry,, and AAA and smoker Lacks caregiver support.         RNCM Clinical Goal(s):  Patient will verbalize understanding of plan for management of HTN, HLD, COPD, Anxiety, Tobacco Use, and AAA as evidenced by keeping appointments, following plan of care, and taking medications as directed  take all medications exactly as prescribed and will call provider for medication related  questions as evidenced by compliance with medications     attend all scheduled medical appointments: 05-15-2021 at 0900 am as evidenced by keeping appointments         demonstrate improved and ongoing adherence to prescribed treatment plan for HTN, HLD, COPD, Anxiety, Tobacco Use, and AAA as evidenced by increasing activity level, calling the office for changes in the plan of care and working with the CCM team to optimize health and well being demonstrate a decrease in HTN, HLD, COPD, Anxiety, Tobacco Use, and AAA exacerbations  as evidenced by no acute changes in health and well being, keeping all appointments with pcp and specialist and calling for changes  demonstrate ongoing self health care management ability for effective management of chronic conditions as evidenced by working with the CCM team     through collaboration with Consulting civil engineer, provider, and care team.   Interventions: 1:1 collaboration with primary care provider regarding development and update of comprehensive plan of care as evidenced by provider attestation and co-signature Inter-disciplinary care team collaboration (see longitudinal plan of care) Evaluation of current treatment plan related to  self management and patient's adherence to plan as established by provider   SDOH Barriers (Status: Goal on Track (progressing): YES.) Long Term Goal  Patient interviewed and SDOH assessment performed        Patient interviewed and appropriate assessments performed Provided patient with information about resources available to assist with health and well being and meeting gaps in [place] and care guides to assist with changes  Discussed plans with patient for ongoing care management follow up and provided patient with direct contact information for care management team Advised patient to call the office for changes in SDOH, new questions or concerns    COPD: (Status: Goal on Track (progressing): YES.) Long Term Goal  Reviewed  medications with patient, including use of prescribed maintenance and rescue inhalers, and provided instruction on medication management and the importance of adherence Provided patient with basic written and verbal COPD education on self care/management/and exacerbation prevention; Advised patient to track and manage COPD triggers;  Provided written and verbal instructions on pursed lip breathing and utilized returned demonstration as teach back; Provided instruction about proper use of medications used for management of COPD including inhalers; Advised patient to self assesses COPD action plan zone and make appointment with provider if in the yellow zone for 48 hours without improvement; Advised patient to engage in light exercise as tolerated 3-5 days a week to aid in the the management of COPD; Provided education about and advised patient to utilize infection prevention strategies to reduce risk of respiratory infection; Discussed the importance of adequate rest and management of fatigue with  COPD; Screening for signs and symptoms of depression related to chronic disease state;  Assessed social determinant of health barriers;   Anxiety  (Status: Goal on Track (progressing): YES.) Long Term Goal  Evaluation of current treatment plan related to Anxiety, Mental Health Concerns  self-management and patient's adherence to plan as established by provider. Discussed plans with patient for ongoing care management follow up and provided patient with direct contact information for care management team Advised patient to call the office for changes in mood, anxiety, or depression ; Provided education to patient re: talking about new anxieties, the patient is feeling much better since he has a stable plan of care for his AAA. The patient is mindful of his alcohol use and is working on reducing his smoking. He states he wants to be healthy and is working on improving his eating habits and getting more  exercise. He is thankful for the support of the CCM team; Reviewed scheduled/upcoming provider appointments including 05-15-2021 at 0900 am,; Discussed plans with patient for ongoing care management follow up and provided patient with direct contact information for care management team; Screening for signs and symptoms of depression related to chronic disease state;  Assessed social determinant of health barriers;   Hyperlipidemia:  (Status: Goal on Track (progressing): YES.) Lab Results  Component Value Date   CHOL 115 02/04/2021   HDL 31 (L) 02/04/2021   LDLCALC 57 02/04/2021   TRIG 157 (H) 02/04/2021   CHOLHDL 3.7 02/04/2021     Medication review performed; medication list updated in electronic medical record.  Provider established cholesterol goals reviewed; Counseled on importance of regular laboratory monitoring as prescribed; Provided HLD educational materials; Reviewed role and benefits of statin for ASCVD risk reduction; Discussed strategies to manage statin-induced myalgias; Reviewed importance of limiting foods high in cholesterol; Reviewed exercise goals and target of 150 minutes per week;  Hypertension: (Status: Goal on Track (progressing): YES.) Last practice recorded BP readings:  BP Readings from Last 3 Encounters:  02/27/21 131/81  02/10/21 134/77  01/13/21 135/79  Most recent eGFR/CrCl:  Lab Results  Component Value Date   EGFR 90 02/04/2021    No components found for: CRCL  Evaluation of current treatment plan related to hypertension self management and patient's adherence to plan as established by provider;   Provided education to patient re: stroke prevention, s/s of heart attack and stroke; Reviewed prescribed diet heart healthy Reviewed medications with patient and discussed importance of compliance;  Counseled on adverse effects of illicit drug and excessive alcohol use in patients with high blood pressure;  Discussed plans with patient for ongoing  care management follow up and provided patient with direct contact information for care management team; Advised patient, providing education and rationale, to monitor blood pressure daily and record, calling PCP for findings outside established parameters;  Advised patient to discuss blood pressure trends with provider; Provided education on prescribed diet heart healthy;  Discussed complications of poorly controlled blood pressure such as heart disease, stroke, circulatory complications, vision complications, kidney impairment, sexual dysfunction;   Smoking Cessation: (Status: Goal on Track (progressing): YES.) Long Term Goal  Reviewed smoking history:  tobacco abuse of >60 years; currently smoking 0.75 ppd, decreased from 1.5 ppd Previous quit attempts, unsuccessful several successful using quitting cold [place]  Reports smoking within 30 minutes of waking up Reports triggers to smoke include: stress, anxiety, habit Reports motivation to quit smoking includes: knows it would help his over all health and well being  On a  scale of 1-10, reports MOTIVATION to quit is 8 On a scale of 1-10, reports CONFIDENCE in quitting is 7  Evaluation of current treatment plan reviewed; Advised patient to discuss smoking cessation options with provider; Provided contact information for [place] [facility]uit [facility] (1-800-QUIT-NOW); Reviewed scheduled/upcoming provider appointments including: 05-15-2021 at 0900 am ; Discussed plans with patient for ongoing care management follow up and provided patient with direct contact information for care management team;   AAA  (Status: Goal on Track (progressing): YES.) Long Term Goal  Evaluation of current treatment plan related to  Ascending aortic aneurysm AAA ,  self-management and patient's adherence to plan as established by provider. Discussed plans with patient for ongoing care management follow up and provided patient with direct contact information for care management  team Advised patient to call the office for changes in condition, questions, or concerns. The patient has seen specialist recently and he is in the surviellence mode of keeping a check on his AAA. It is 4.8 cm and they do not do surgery until it is 5.5 cm.  The patient has clear instructios on how to effectively manage his chronic conditions ; Provided education to patient re: taking medications as ordered, keeping appointments, not doing any heavy lifting, smoking cessation, safety, controlling cholesterol and blood pressures and being mindful of acute changes in his health and well being; Reviewed medications with patient and discussed compliance ; Reviewed scheduled/upcoming provider appointments including 05-15-2021 at 0900 am; Discussed plans with patient for ongoing care management follow up and provided patient with direct contact information for care management team;  Patient Goals/Self-Care Activities: Patient will self administer medications as prescribed as evidenced by self report/primary caregiver report  Patient will attend all scheduled provider appointments as evidenced by clinician review of documented attendance to scheduled appointments and patient/caregiver report Patient will call pharmacy for medication refills as evidenced by patient report and review of pharmacy fill history as appropriate Patient will attend church or other social activities as evidenced by patient report Patient will continue to perform ADL's independently as evidenced by patient/caregiver report Patient will continue to perform IADL's independently as evidenced by patient/caregiver report Patient will call provider office for new concerns or questions as evidenced by review of documented incoming telephone call notes and patient report Patient will work with BSW to address care coordination needs and will continue to work with the clinical team to address health care and disease management related needs as  evidenced by documented adherence to scheduled care management/care coordination appointments - check blood pressure 3 times per week - choose a place to take my blood pressure (home, clinic or office, retail store) - write blood pressure results in a log or diary - learn about high blood pressure - keep a blood pressure log - take blood pressure log to all doctor appointments - call doctor for signs and symptoms of high blood pressure - develop an action plan for high blood pressure - keep all doctor appointments - take medications for blood pressure exactly as prescribed - report new symptoms to your doctor - eat more whole grains, fruits and vegetables, lean meats and healthy fats - call for medicine refill 2 or 3 days before it runs out - take all medications exactly as prescribed - call doctor with any symptoms you believe are related to your medicine - call doctor when you experience any new symptoms - go to all doctor appointments as scheduled - adhere to prescribed diet: Heart Healthy diet  Plan:Telephone follow up appointment with care management team member scheduled for:  06-04-2020 at 1:45 pm  [place], MSN, [facility] Mobile: [phone]

## 2021-04-30 DIAGNOSIS — E785 Hyperlipidemia, unspecified: Secondary | ICD-10-CM | POA: Diagnosis not present

## 2021-04-30 DIAGNOSIS — J449 Chronic obstructive pulmonary disease, unspecified: Secondary | ICD-10-CM

## 2021-04-30 DIAGNOSIS — I10 Essential (primary) hypertension: Secondary | ICD-10-CM

## 2021-04-30 DIAGNOSIS — E7800 Pure hypercholesterolemia, unspecified: Secondary | ICD-10-CM | POA: Diagnosis not present

## 2021-04-30 DIAGNOSIS — J410 Simple chronic bronchitis: Secondary | ICD-10-CM | POA: Diagnosis not present

## 2021-05-05 ENCOUNTER — Other Ambulatory Visit: Payer: Self-pay

## 2021-05-05 ENCOUNTER — Other Ambulatory Visit: Payer: PPO

## 2021-05-05 ENCOUNTER — Ambulatory Visit: Payer: PPO | Admitting: Licensed Clinical Social Worker

## 2021-05-05 DIAGNOSIS — I719 Aortic aneurysm of unspecified site, without rupture: Secondary | ICD-10-CM

## 2021-05-05 DIAGNOSIS — E785 Hyperlipidemia, unspecified: Secondary | ICD-10-CM

## 2021-05-05 DIAGNOSIS — I10 Essential (primary) hypertension: Secondary | ICD-10-CM | POA: Diagnosis not present

## 2021-05-05 DIAGNOSIS — J410 Simple chronic bronchitis: Secondary | ICD-10-CM

## 2021-05-05 DIAGNOSIS — F1020 Alcohol dependence, uncomplicated: Secondary | ICD-10-CM

## 2021-05-05 DIAGNOSIS — Z720 Tobacco use: Secondary | ICD-10-CM

## 2021-05-06 ENCOUNTER — Encounter: Payer: Self-pay | Admitting: Family Medicine

## 2021-05-06 LAB — COMPREHENSIVE METABOLIC PANEL
ALT: 17 IU/L (ref 0–44)
AST: 16 IU/L (ref 0–40)
Albumin/Globulin Ratio: 1.7 (ref 1.2–2.2)
Albumin: 4.4 g/dL (ref 3.7–4.7)
Alkaline Phosphatase: 79 IU/L (ref 44–121)
BUN/Creatinine Ratio: 22 (ref 10–24)
BUN: 17 mg/dL (ref 8–27)
Bilirubin Total: 0.5 mg/dL (ref 0.0–1.2)
CO2: 26 mmol/L (ref 20–29)
Calcium: 9.4 mg/dL (ref 8.6–10.2)
Chloride: 102 mmol/L (ref 96–106)
Creatinine, Ser: 0.76 mg/dL (ref 0.76–1.27)
Globulin, Total: 2.6 g/dL (ref 1.5–4.5)
Glucose: 99 mg/dL (ref 70–99)
Potassium: 4.5 mmol/L (ref 3.5–5.2)
Sodium: 146 mmol/L — ABNORMAL HIGH (ref 134–144)
Total Protein: 7 g/dL (ref 6.0–8.5)
eGFR: 94 mL/min/{1.73_m2} (ref >59)

## 2021-05-06 LAB — CBC WITH DIFFERENTIAL/PLATELET
Basophils Absolute: 0.1 10*3/uL (ref 0.0–0.2)
Basos: 1 %
EOS (ABSOLUTE): 0.1 10*3/uL (ref 0.0–0.4)
Eos: 1 %
Hematocrit: 40.5 % (ref 37.5–51.0)
Hemoglobin: 13.9 g/dL (ref 13.0–17.7)
Immature Grans (Abs): 0 10*3/uL (ref 0.0–0.1)
Immature Granulocytes: 0 %
Lymphocytes Absolute: 1.6 10*3/uL (ref 0.7–3.1)
Lymphs: 20 %
MCH: 31.9 pg (ref 26.6–33.0)
MCHC: 34.3 g/dL (ref 31.5–35.7)
MCV: 93 fL (ref 79–97)
Monocytes Absolute: 0.7 10*3/uL (ref 0.1–0.9)
Monocytes: 8 %
Neutrophils Absolute: 5.6 10*3/uL (ref 1.4–7.0)
Neutrophils: 70 %
Platelets: 320 10*3/uL (ref 150–450)
RBC: 4.36 x10E6/uL (ref 4.14–5.80)
RDW: 12.7 % (ref 11.6–15.4)
WBC: 8.1 10*3/uL (ref 3.4–10.8)

## 2021-05-06 LAB — LIPID PANEL
Chol/HDL Ratio: 3.7 ratio (ref 0.0–5.0)
Cholesterol, Total: 119 mg/dL (ref 100–199)
HDL: 32 mg/dL — ABNORMAL LOW (ref >39)
LDL Chol Calc (NIH): 48 mg/dL (ref 0–99)
Triglycerides: 246 mg/dL — ABNORMAL HIGH (ref 0–149)
VLDL Cholesterol Cal: 39 mg/dL (ref 5–40)

## 2021-05-06 LAB — TSH: TSH: 2.46 u[IU]/mL (ref 0.450–4.500)

## 2021-05-07 NOTE — Progress Notes (Signed)
Chronic Care Management    Clinical Social Work Note  05/07/2021 Name: Carl Allen MRN: [id number] DOB: February 23, 1946  Carl Allen is a 75 y.o. year old male who is a primary care patient of [name], [name], MD. The CCM team was consulted to assist the patient with chronic disease management and/or care coordination needs related to: [place] and Resources.   Engaged with patient by telephone for follow up visit in response to provider referral for social work chronic care management and care coordination services.   Consent to Services:  The patient was given information about Chronic Care Management services, agreed to services, and gave verbal consent prior to initiation of services.  Please see initial visit note for detailed documentation.   Patient agreed to services and consent obtained.   Consent to Services:  The patient was given information about Care Management services, agreed to services, and gave verbal consent prior to initiation of services.  Please see initial visit note for detailed documentation.   Patient agreed to services today and consent obtained.  Engaged with patient by phone in response to provider referral for social work care coordination services:  Assessment/Interventions:  Patient has completed all goals with CCM LCSW .  See Care Plan below for interventions and patient self-care activities  Recommendation: Patient may benefit from, and is in agreement to continue working with CCM RN to assist with management of health conditions.   Follow up Plan: No follow up required. Patient completed all goals with CCM LCSW  SDOH (Social Determinants of Health) assessments and interventions performed:    Advanced Directives Status: Not addressed in this encounter.  CCM Care Plan  No Known Allergies  Outpatient Encounter Medications as of 05/05/2021  Medication Sig   amLODipine (NORVASC) 5 MG tablet Take 1 tablet (5 mg total) by mouth daily.    atorvastatin (LIPITOR) 80 MG tablet Take 1 tablet (80 mg total) by mouth daily.   benazepril (LOTENSIN) 40 MG tablet Take 1 tablet (40 mg total) by mouth daily.   cetirizine (ZYRTEC) 10 MG tablet Take 10 mg by mouth daily as needed for allergies.   fenofibrate micronized (LOFIBRA) 134 MG capsule Take 1 capsule (134 mg total) by mouth daily before breakfast.   ferrous sulfate 325 (65 FE) MG tablet Take 325 mg by mouth daily with breakfast.   hydrochlorothiazide (HYDRODIURIL) 25 MG tablet Take 1 tablet (25 mg total) by mouth daily.   Multiple Vitamins-Minerals (CENTRUM SILVER 50+MEN) TABS Take 1 tablet by mouth daily.   nicotine (NICODERM CQ) 21 mg/24hr patch Place 1 patch (21 mg total) onto the skin daily.   tiotropium (SPIRIVA HANDIHALER) 18 MCG inhalation capsule Place 1 capsule (18 mcg total) into inhaler and inhale every morning.   No facility-administered encounter medications on file as of 05/05/2021.    Patient Active Problem List   Diagnosis Date Noted   Need for influenza vaccination 02/10/2021   Aortic aneurysm without rupture ([place]) 02/10/2021   Tobacco abuse 02/10/2021   Prostate cancer ([place]) 08/26/2017   Advanced care planning/counseling discussion 01/12/2017   Chronic alcoholism ([place]) 07/14/2016   Eczema 01/08/2016   COPD (chronic obstructive pulmonary disease) ([place]) 01/07/2015   Hyperlipidemia 01/07/2015   Essential hypertension 11/09/2014   Alcohol abuse counseling and surveillance 11/09/2014   History of atrial fibrillation 10/12/2013    Conditions to be addressed/monitored: Anxiety  Care Plan : General Social Work (Adult)  Updates made by Rebekah Chesterfield, LCSW since 05/07/2021 12:00 AM  Problem: Coping Skills (General Plan of Care)      Goal: Coping Skills Enhanced Completed 05/05/2021  Start Date: 12/26/2020  This Visit's Progress: On track  Recent Progress: On track  Priority: High  Note:   Current barriers:   Level of care concerns and Substance abuse  issues -  Tobacco use and hx of Alcohol Use Needs Support, Education, and Care Coordination in order to meet unmet mental health needs Clinical Goal(s): Over the next 120 days, patient will work with SW, counselor and therapist to reduce or manage symptoms of agitation, mood instability, stress, and bipolar until connected for ongoing counseling. Clinical Interventions:  Assessed patient's previous and current treatment, coping skills, support system and barriers to care  Patient interviewed and appropriate assessments performed Patient endorses feelings of anxiety triggered by diagnosis of AAA, which may result in open heart surgery. Patient has an upcoming appointment scheduled on August 11, 22 with Cardiologist 09/26: Patient reports that his recent echocardiogram "looked fairly good" although it confirmed he had AAA. Patient met with surgeon and was informed they weren't considering surgery at this time. He has scheduled a follow up appt in October 2022. Patient plans on addressing he and PCP's confusion regarding the size of the aneurysm Patient denies the need of therapy or medication management to assist with management of symptoms. Denies depression symptoms. Denies suicidal or homicidal ideation Patient resides alone and prides self on being independent. Patient receives strong support from brother and sister in law, who resides in [place], [place] LCSW provided validation and encouragement. Strategies to prepare for upcoming appointment were identified. Patient agreed to have brother accompany him to the appointment and provide transportation. He agreed to list appropriate questions to discuss with provider and have brother take notes to reference later Patient reports continued cigarette smoking. States he is aware that he needs to quit in the near future. He has a box of nicotine patches to assist with tobacco cessation 09/26: Patient reports that he is still smoking "some" States he has went  from smoking 1.5 a pack of cigarettes daily to .75 pack daily. Patient has nicotine patches that he plans to use once he reduces the amount of smoking 12/5: Identified motivation agents to stay focused on goal of tobacco cessation CCM LCSW discussed SMART goals and strategies to assist patient in reaching his goals of tobacco cessation. Patient continues to  "make a conscious effort to not to smoke as much" Patient shared that since sobriety from alcohol, he feels better and has an improved sleep hygiene. Patient shared that he remains motivated to stay sober 09/26: Patient reports occasional difficulty going to sleep when his eczema flares up. Patient utilizes body lotion that assists and agreed to address with PCP at upcoming appt, if symptoms have not resolved Patient reports compliance with choloestrol medicine 09/26: Patient reports compliance with medications which has significantly improved his cholesterol levels, per patient CCM LCSW reviewed upcoming appointments. Patient keeps AVS to assist him with keeping track of appointments Mindfulness or Relaxation Training, Active listening / Reflection utilized , Emotional Supportive Provided, Psychoeducation for mental health needs , and Verbalization of feelings encouraged  ; Discussed plans with patient for ongoing care management follow up and provided patient with direct contact information for care management team Collaboration with PCP regarding development and update of comprehensive plan of care as evidenced by provider attestation and co-signature Inter-disciplinary care team collaboration (see longitudinal plan of care) Patient Goals/Self-Care Activities: Over the next 120 days  Attend all scheduled medical appointments Contact clinic with any questions or concerns Utilize strategies discussed to assist with stress management       Christa See, MSW, [email or web address] Phone [phone] 1:36 PM

## 2021-05-07 NOTE — Patient Instructions (Signed)
Visit Information  Thank you for taking time to visit with me today. Please don't hesitate to contact me if I can be of assistance to you before our next scheduled telephone appointment.  Following are the goals we discussed today:  Patient Goals/Self-Care Activities:  Attend all scheduled medical appointments Contact clinic with any questions or concerns Utilize strategies discussed to assist with stress management  Please call the care guide team at [phone] if you need to cancel or reschedule your appointment.   If you are experiencing a Mental Health or [place] or need someone to talk to, please call the Suicide and Crisis Lifeline: 988 call 911   Patient verbalizes understanding of instructions provided today and agrees to view in [place].   No further follow up required: Patient completed all goals associated with CCM LCSW  Christa See, MSW, [email or web address] Phone [phone] 1:33 PM

## 2021-05-15 ENCOUNTER — Other Ambulatory Visit: Payer: Self-pay

## 2021-05-15 ENCOUNTER — Encounter: Payer: Self-pay | Admitting: Internal Medicine

## 2021-05-15 ENCOUNTER — Ambulatory Visit (INDEPENDENT_AMBULATORY_CARE_PROVIDER_SITE_OTHER): Payer: PPO | Admitting: Internal Medicine

## 2021-05-15 VITALS — BP 128/70 | HR 78 | Temp 98.0°F | Ht 67.13 in | Wt 154.2 lb

## 2021-05-15 DIAGNOSIS — I10 Essential (primary) hypertension: Secondary | ICD-10-CM

## 2021-05-15 DIAGNOSIS — L309 Dermatitis, unspecified: Secondary | ICD-10-CM

## 2021-05-15 DIAGNOSIS — E7800 Pure hypercholesterolemia, unspecified: Secondary | ICD-10-CM | POA: Diagnosis not present

## 2021-05-15 DIAGNOSIS — J410 Simple chronic bronchitis: Secondary | ICD-10-CM

## 2021-05-15 MED ORDER — HYDROCHLOROTHIAZIDE 25 MG PO TABS: 25.0000 mg | ORAL_TABLET | Freq: Every day | ORAL | 1 refills | Status: DC

## 2021-05-15 MED ORDER — SPIRIVA HANDIHALER 18 MCG IN CAPS
18.0000 ug | ORAL_CAPSULE | Freq: Every morning | RESPIRATORY_TRACT | 1 refills | Status: DC
Start: 2021-05-15 — End: 2021-11-19

## 2021-05-15 MED ORDER — ALBUTEROL SULFATE HFA 108 (90 BASE) MCG/ACT IN AERS: 2.0000 | INHALATION_SPRAY | Freq: Four times a day (QID) | RESPIRATORY_TRACT | 0 refills | Status: DC | PRN

## 2021-05-15 MED ORDER — TRIAMCINOLONE 0.1 % CREAM:EUCERIN CREAM 1:1: 1.0000 "application " | TOPICAL_CREAM | Freq: Three times a day (TID) | CUTANEOUS | 5 refills | Status: DC | PRN

## 2021-05-15 MED ORDER — BENAZEPRIL HCL 40 MG PO TABS
40.0000 mg | ORAL_TABLET | Freq: Every day | ORAL | 1 refills | Status: DC
Start: 2021-05-15 — End: 2021-11-19

## 2021-05-15 MED ORDER — AMLODIPINE BESYLATE 5 MG PO TABS: 5.0000 mg | ORAL_TABLET | Freq: Every day | ORAL | 1 refills | Status: DC

## 2021-05-15 MED ORDER — FENOFIBRATE MICRONIZED 134 MG PO CAPS
134.0000 mg | ORAL_CAPSULE | Freq: Every day | ORAL | 1 refills | Status: DC
Start: 2021-05-15 — End: 2021-11-19

## 2021-05-15 NOTE — Patient Instructions (Signed)
Latest Reference Range & Units 05/05/21 09:22  COMPREHENSIVE METABOLIC PANEL  Rpt !  Sodium 134 - 144 mmol/L 146 (H)  Potassium 3.5 - 5.2 mmol/L 4.5  Chloride 96 - 106 mmol/L 102  CO2 20 - 29 mmol/L 26  Glucose 70 - 99 mg/dL 99  BUN 8 - 27 mg/dL 17  Creatinine 0.76 - 1.27 mg/dL 0.76  Calcium 8.6 - 10.2 mg/dL 9.4  BUN/Creatinine Ratio 10 - 24  22  eGFR >59 mL/min/1.73 94  Alkaline Phosphatase 44 - 121 IU/L 79  Albumin 3.7 - 4.7 g/dL 4.4  Albumin/Globulin Ratio 1.2 - 2.2  1.7  AST 0 - 40 IU/L 16  ALT 0 - 44 IU/L 17  Total Protein 6.0 - 8.5 g/dL 7.0  Total Bilirubin 0.0 - 1.2 mg/dL 0.5  Total CHOL/HDL Ratio 0.0 - 5.0 ratio 3.7  Cholesterol, Total 100 - 199 mg/dL 119  HDL Cholesterol >39 mg/dL 32 (L)  Triglycerides 0 - 149 mg/dL 246 (H)  VLDL Cholesterol Cal 5 - 40 mg/dL 39  LDL Chol Calc (NIH) 0 - 99 mg/dL 48  Globulin, Total 1.5 - 4.5 g/dL 2.6  WBC 3.4 - 10.8 x10E3/uL 8.1  RBC 4.14 - 5.80 x10E6/uL 4.36  Hemoglobin 13.0 - 17.7 g/dL 13.9  HCT 37.5 - 51.0 % 40.5  MCV 79 - 97 fL 93  MCH 26.6 - 33.0 pg 31.9  MCHC 31.5 - 35.7 g/dL 34.3  RDW 11.6 - 15.4 % 12.7  Platelets 150 - 450 x10E3/uL 320  Neutrophils Not Estab. % 70  Immature Granulocytes Not Estab. % 0  NEUT# 1.4 - 7.0 x10E3/uL 5.6  Lymphocyte # 0.7 - 3.1 x10E3/uL 1.6  Monocytes Absolute 0.1 - 0.9 x10E3/uL 0.7  Basophils Absolute 0.0 - 0.2 x10E3/uL 0.1  Immature Grans (Abs) 0.0 - 0.1 x10E3/uL 0.0  Lymphs Not Estab. % 20  Monocytes Not Estab. % 8  Basos Not Estab. % 1  Eos Not Estab. % 1  EOS (ABSOLUTE) 0.0 - 0.4 x10E3/uL 0.1  TSH 0.450 - 4.500 uIU/mL 2.460  !: Data is abnormal (H): Data is abnormally high (L): Data is abnormally low Rpt: View report in Results Review for more informationHigh Cholesterol High cholesterol is a condition in which the blood has high levels of a white, waxy substance similar to fat (cholesterol). The liver makes all the cholesterol that the body needs. The human body needs small amounts  of cholesterol to help build cells. A person gets extra or excess cholesterol from the food that he or she eats. The blood carries cholesterol from the liver to the rest of the body. If you have high cholesterol, deposits (plaques) may build up on the walls of your arteries. Arteries are the blood vessels that carry blood away from your heart. These plaques make the arteries narrow and stiff. Cholesterol plaques increase your risk for heart attack and stroke. Work with your health care provider to keep your cholesterol levels in a healthy range. What increases the risk? The following factors may make you more likely to develop this condition: Eating foods that are high in animal fat (saturated fat) or cholesterol. Being overweight. Not getting enough exercise. A family history of high cholesterol (familial hypercholesterolemia). Use of tobacco products. Having diabetes. What are the signs or symptoms? In most cases, high cholesterol does not usually cause any symptoms. In severe cases, very high cholesterol levels can cause: Fatty bumps under the skin (xanthomas). A white or gray ring around the black center (pupil) of  the eye. How is this diagnosed? This condition may be diagnosed based on the results of a blood test. If you are older than 75 years of age, your health care provider may check your cholesterol levels every 4-6 years. You may be checked more often if you have high cholesterol or other risk factors for heart disease. The blood test for cholesterol measures: "Bad" cholesterol, or LDL cholesterol. This is the main type of cholesterol that causes heart disease. The desired level is less than 100 mg/dL (2.59 mmol/L). "Good" cholesterol, or HDL cholesterol. HDL helps protect against heart disease by cleaning the arteries and carrying the LDL to the liver for processing. The desired level for HDL is 60 mg/dL (1.55 mmol/L) or higher. Triglycerides. These are fats that your body can  store or burn for energy. The desired level is less than 150 mg/dL (1.69 mmol/L). Total cholesterol. This measures the total amount of cholesterol in your blood and includes LDL, HDL, and triglycerides. The desired level is less than 200 mg/dL (5.17 mmol/L). How is this treated? Treatment for high cholesterol starts with lifestyle changes, such as diet and exercise. Diet changes. You may be asked to eat foods that have more fiber and less saturated fats or added sugar. Lifestyle changes. These may include regular exercise, maintaining a healthy weight, and quitting use of tobacco products. Medicines. These are given when diet and lifestyle changes have not worked. You may be prescribed a statin medicine to help lower your cholesterol levels. Follow these instructions at home: Eating and drinking  Eat a healthy, balanced diet. This diet includes: Daily servings of a variety of fresh, frozen, or canned fruits and vegetables. Daily servings of whole grain foods that are rich in fiber. Foods that are low in saturated fats and trans fats. These include poultry and fish without skin, lean cuts of meat, and low-fat dairy products. A variety of fish, especially oily fish that contain omega-3 fatty acids. Aim to eat fish at least 2 times a week. Avoid foods and drinks that have added sugar. Use healthy cooking methods, such as roasting, grilling, broiling, baking, poaching, steaming, and stir-frying. Do not fry your food except for stir-frying. If you drink alcohol: Limit how much you have to: 0-1 drink a day for women who are not pregnant. 0-2 drinks a day for men. Know how much alcohol is in a drink. In the [place] one drink equals one 12 oz bottle of beer (355 mL), one 5 oz glass of wine (148 mL), or one 1 oz glass of hard liquor (44 mL). Lifestyle  Get regular exercise. Aim to exercise for a total of 150 minutes a week. Increase your activity level by doing activities such as gardening, walking,  and taking the stairs. Do not use any products that contain nicotine or tobacco. These products include cigarettes, chewing tobacco, and vaping devices, such as e-cigarettes. If you need help quitting, ask your health care provider. General instructions Take over-the-counter and prescription medicines only as told by your health care provider. Keep all follow-up visits. This is important. Where to find more information American Heart Association: www.heart.org National Heart, Lung, and Blood Institute: https://wilson-eaton.com/ Contact a health care provider if: You have trouble achieving or maintaining a healthy diet or weight. You are starting an exercise program. You are unable to stop smoking. Get help right away if: You have chest pain. You have trouble breathing. You have discomfort or pain in your jaw, neck, back, shoulder, or arm. You have  any symptoms of a stroke. "BE FAST" is an easy way to remember the main warning signs of a stroke: B - Balance. Signs are dizziness, sudden trouble walking, or loss of balance. E - Eyes. Signs are trouble seeing or a sudden change in vision. F - Face. Signs are sudden weakness or numbness of the face, or the face or eyelid drooping on one side. A - Arms. Signs are weakness or numbness in an arm. This happens suddenly and usually on one side of the body. S - Speech. Signs are sudden trouble speaking, slurred speech, or trouble understanding what people say. T - Time. Time to call emergency services. Write down what time symptoms started. You have other signs of a stroke, such as: A sudden, severe headache with no known cause. Nausea or vomiting. Seizure. These symptoms may represent a serious problem that is an emergency. Do not wait to see if the symptoms will go away. Get medical help right away. Call your local emergency services (911 in the [place] Do not drive yourself to the hospital. Summary Cholesterol plaques increase your risk for heart attack  and stroke. Work with your health care provider to keep your cholesterol levels in a healthy range. Eat a healthy, balanced diet, get regular exercise, and maintain a healthy weight. Do not use any products that contain nicotine or tobacco. These products include cigarettes, chewing tobacco, and vaping devices, such as e-cigarettes. Get help right away if you have any symptoms of a stroke. This information is not intended to replace advice given to you by your health care provider. Make sure you discuss any questions you have with your health care provider. Document Revised: 08/01/2020 Document Reviewed: 07/22/2020 Elsevier Patient Education  2022 [employer].

## 2021-05-15 NOTE — Progress Notes (Signed)
BP 128/70    Pulse 78    Temp 98 F (36.7 C) (Oral)    Ht 5' 7.13" (1.705 m)    Wt 154 lb 3.2 oz (69.9 kg)    SpO2 98%    BMI 24.06 kg/m    Subjective:    Patient ID: Carl Allen, male    DOB: 1945-11-17, 75 y.o.   MRN: [id number]  Chief Complaint  Patient presents with   Hypertension   Hyperlipidemia   Anxiety   COPD   Medication Refill    HPI: Carl Allen is a 75 y.o. male  Hypertension This is a chronic problem. The problem is controlled. Associated symptoms include anxiety. Pertinent negatives include no blurred vision, chest pain, headaches, malaise/fatigue, neck pain, orthopnea or palpitations.  Hyperlipidemia This is a chronic (TG still high is on max doses of fenofibrate and lipitor) problem. Pertinent negatives include no chest pain.  Anxiety Presents for follow-up visit. Patient reports no chest pain or palpitations.    COPD There is no chest tightness or cough. Primary symptoms comments: Is on spiriva for such stable. Pertinent negatives include no chest pain, headaches or malaise/fatigue. His past medical history is significant for COPD.  Medication Refill Pertinent negatives include no chest pain, coughing, headaches or neck pain.   Chief Complaint  Patient presents with   Hypertension   Hyperlipidemia   Anxiety   COPD   Medication Refill    Relevant past medical, surgical, family and social history reviewed and updated as indicated. Interim medical history since our last visit reviewed. Allergies and medications reviewed and updated.  Review of Systems  Constitutional:  Negative for malaise/fatigue.  Eyes:  Negative for blurred vision.  Respiratory:  Negative for cough.   Cardiovascular:  Negative for chest pain, palpitations and orthopnea.  Musculoskeletal:  Negative for neck pain.  Neurological:  Negative for headaches.   Per HPI unless specifically indicated above     Objective:    BP 128/70    Pulse 78    Temp 98 F (36.7  C) (Oral)    Ht 5' 7.13" (1.705 m)    Wt 154 lb 3.2 oz (69.9 kg)    SpO2 98%    BMI 24.06 kg/m   Wt Readings from Last 3 Encounters:  05/15/21 154 lb 3.2 oz (69.9 kg)  02/27/21 152 lb (68.9 kg)  02/10/21 152 lb 6.4 oz (69.1 kg)    Physical Exam Skin:    Findings: Rash present.     Comments: Has ecxematous plaques     Results for orders placed or performed in visit on 05/05/21  CBC w/Diff  Result Value Ref Range   WBC 8.1 3.4 - 10.8 x10E3/uL   RBC 4.36 4.14 - 5.80 x10E6/uL   Hemoglobin 13.9 13.0 - 17.7 g/dL   Hematocrit 40.5 37.5 - 51.0 %   MCV 93 79 - 97 fL   MCH 31.9 26.6 - 33.0 pg   MCHC 34.3 31.5 - 35.7 g/dL   RDW 12.7 11.6 - 15.4 %   Platelets 320 150 - 450 x10E3/uL   Neutrophils 70 Not Estab. %   Lymphs 20 Not Estab. %   Monocytes 8 Not Estab. %   Eos 1 Not Estab. %   Basos 1 Not Estab. %   Neutrophils Absolute 5.6 1.4 - 7.0 x10E3/uL   Lymphocytes Absolute 1.6 0.7 - 3.1 x10E3/uL   Monocytes Absolute 0.7 0.1 - 0.9 x10E3/uL   EOS (ABSOLUTE) 0.1 0.0 - 0.4  x10E3/uL   Basophils Absolute 0.1 0.0 - 0.2 x10E3/uL   Immature Granulocytes 0 Not Estab. %   Immature Grans (Abs) 0.0 0.0 - 0.1 x10E3/uL  Comprehensive metabolic panel  Result Value Ref Range   Glucose 99 70 - 99 mg/dL   BUN 17 8 - 27 mg/dL   Creatinine, Ser 0.76 0.76 - 1.27 mg/dL   eGFR 94 >59 mL/min/1.73   BUN/Creatinine Ratio 22 10 - 24   Sodium 146 (H) 134 - 144 mmol/L   Potassium 4.5 3.5 - 5.2 mmol/L   Chloride 102 96 - 106 mmol/L   CO2 26 20 - 29 mmol/L   Calcium 9.4 8.6 - 10.2 mg/dL   Total Protein 7.0 6.0 - 8.5 g/dL   Albumin 4.4 3.7 - 4.7 g/dL   Globulin, Total 2.6 1.5 - 4.5 g/dL   Albumin/Globulin Ratio 1.7 1.2 - 2.2   Bilirubin Total 0.5 0.0 - 1.2 mg/dL   Alkaline Phosphatase 79 44 - 121 IU/L   AST 16 0 - 40 IU/L   ALT 17 0 - 44 IU/L  TSH  Result Value Ref Range   TSH 2.460 0.450 - 4.500 uIU/mL  Lipid panel  Result Value Ref Range   Cholesterol, Total 119 100 - 199 mg/dL   Triglycerides  246 (H) 0 - 149 mg/dL   HDL 32 (L) >39 mg/dL   VLDL Cholesterol Cal 39 5 - 40 mg/dL   LDL Chol Calc (NIH) 48 0 - 99 mg/dL   Chol/HDL Ratio 3.7 0.0 - 5.0 ratio        Current Outpatient Medications:    atorvastatin (LIPITOR) 80 MG tablet, Take 1 tablet (80 mg total) by mouth daily., Disp: 90 tablet, Rfl: 3   cetirizine (ZYRTEC) 10 MG tablet, Take 10 mg by mouth daily as needed for allergies., Disp: , Rfl:    ferrous sulfate 325 (65 FE) MG tablet, Take 325 mg by mouth daily with breakfast., Disp: , Rfl:    Multiple Vitamins-Minerals (CENTRUM SILVER 50+MEN) TABS, Take 1 tablet by mouth daily., Disp: , Rfl:    nicotine (NICODERM CQ) 21 mg/24hr patch, Place 1 patch (21 mg total) onto the skin daily., Disp: 28 patch, Rfl: 0   amLODipine (NORVASC) 5 MG tablet, Take 1 tablet (5 mg total) by mouth daily., Disp: 90 tablet, Rfl: 1   benazepril (LOTENSIN) 40 MG tablet, Take 1 tablet (40 mg total) by mouth daily., Disp: 90 tablet, Rfl: 1   fenofibrate micronized (LOFIBRA) 134 MG capsule, Take 1 capsule (134 mg total) by mouth daily before breakfast., Disp: 90 capsule, Rfl: 1   hydrochlorothiazide (HYDRODIURIL) 25 MG tablet, Take 1 tablet (25 mg total) by mouth daily., Disp: 90 tablet, Rfl: 1   tiotropium (SPIRIVA HANDIHALER) 18 MCG inhalation capsule, Place 1 capsule (18 mcg total) into inhaler and inhale every morning., Disp: 90 capsule, Rfl: 1    Assessment & Plan:  HTN :  Continue current meds.  Medication compliance emphasised. pt advised to keep Bp logs. Pt verbalised understanding of the same. Pt to have a low salt diet . Exercise to reach a goal of at least 150 mins a week.  lifestyle modifications explained and pt understands importance of the above.  2. Smoking cessation is smoking 1/2 ppd  - will need to use patches more religiously  3. Asceding aneursym to fu with Dr. Lowella Dell @ [facility] virtually.  To have a CTA for such ordered per cardiothoracic surgery  4. To have a low  dose  CT of lung - was done in 7/22 will need to recheck next year.   Problem List Items Addressed This Visit       Cardiovascular and Mediastinum   Essential hypertension   Relevant Medications   amLODipine (NORVASC) 5 MG tablet   benazepril (LOTENSIN) 40 MG tablet   fenofibrate micronized (LOFIBRA) 134 MG capsule   hydrochlorothiazide (HYDRODIURIL) 25 MG tablet     Respiratory   COPD (chronic obstructive pulmonary disease) (HCC)   Relevant Medications   tiotropium (SPIRIVA HANDIHALER) 18 MCG inhalation capsule   Other Visit Diagnoses     Hypercholesteremia       Relevant Medications   amLODipine (NORVASC) 5 MG tablet   benazepril (LOTENSIN) 40 MG tablet   fenofibrate micronized (LOFIBRA) 134 MG capsule   hydrochlorothiazide (HYDRODIURIL) 25 MG tablet        No orders of the defined types were placed in this encounter.    Meds ordered this encounter  Medications   amLODipine (NORVASC) 5 MG tablet    Sig: Take 1 tablet (5 mg total) by mouth daily.    Dispense:  90 tablet    Refill:  1   benazepril (LOTENSIN) 40 MG tablet    Sig: Take 1 tablet (40 mg total) by mouth daily.    Dispense:  90 tablet    Refill:  1   fenofibrate micronized (LOFIBRA) 134 MG capsule    Sig: Take 1 capsule (134 mg total) by mouth daily before breakfast.    Dispense:  90 capsule    Refill:  1   hydrochlorothiazide (HYDRODIURIL) 25 MG tablet    Sig: Take 1 tablet (25 mg total) by mouth daily.    Dispense:  90 tablet    Refill:  1   tiotropium (SPIRIVA HANDIHALER) 18 MCG inhalation capsule    Sig: Place 1 capsule (18 mcg total) into inhaler and inhale every morning.    Dispense:  90 capsule    Refill:  1     Follow up plan: No follow-ups on file.

## 2021-06-04 ENCOUNTER — Ambulatory Visit (INDEPENDENT_AMBULATORY_CARE_PROVIDER_SITE_OTHER): Payer: PPO

## 2021-06-04 ENCOUNTER — Telehealth: Payer: PPO

## 2021-06-04 DIAGNOSIS — E785 Hyperlipidemia, unspecified: Secondary | ICD-10-CM

## 2021-06-04 DIAGNOSIS — Z720 Tobacco use: Secondary | ICD-10-CM

## 2021-06-04 DIAGNOSIS — J410 Simple chronic bronchitis: Secondary | ICD-10-CM

## 2021-06-04 DIAGNOSIS — F419 Anxiety disorder, unspecified: Secondary | ICD-10-CM

## 2021-06-04 DIAGNOSIS — I719 Aortic aneurysm of unspecified site, without rupture: Secondary | ICD-10-CM

## 2021-06-04 DIAGNOSIS — E7800 Pure hypercholesterolemia, unspecified: Secondary | ICD-10-CM

## 2021-06-04 DIAGNOSIS — F17200 Nicotine dependence, unspecified, uncomplicated: Secondary | ICD-10-CM

## 2021-06-04 DIAGNOSIS — I10 Essential (primary) hypertension: Secondary | ICD-10-CM

## 2021-06-04 NOTE — Patient Instructions (Signed)
Visit Information  Thank you for taking time to visit with me today. Please don't hesitate to contact me if I can be of assistance to you before our next scheduled telephone appointment.  Following are the goals we discussed today:  (RNCM Clinical Goal(s):  Patient will verbalize understanding of plan for management of HTN, HLD, COPD, Anxiety, Tobacco Use, and AAA as evidenced by keeping appointments, following plan of care, and taking medications as directed  take all medications exactly as prescribed and will call provider for medication related questions as evidenced by compliance with medications     attend all scheduled medical appointments: 11-13-2021 at 10 am as evidenced by keeping appointments         demonstrate improved and ongoing adherence to prescribed treatment plan for HTN, HLD, COPD, Anxiety, Tobacco Use, and AAA as evidenced by increasing activity level, calling the office for changes in the plan of care and working with the CCM team to optimize health and well being demonstrate a decrease in HTN, HLD, COPD, Anxiety, Tobacco Use, and AAA exacerbations  as evidenced by no acute changes in health and well being, keeping all appointments with pcp and specialist and calling for changes  demonstrate ongoing self health care management ability for effective management of chronic conditions as evidenced by working with the CCM team     through collaboration with Consulting civil engineer, provider, and care team.    Interventions: 1:1 collaboration with primary care provider regarding development and update of comprehensive plan of care as evidenced by provider attestation and co-signature Inter-disciplinary care team collaboration (see longitudinal plan of care) Evaluation of current treatment plan related to  self management and patient's adherence to plan as established by provider     SDOH Barriers (Status: Goal on Track (progressing): YES.) Long Term Goal  Patient interviewed and SDOH  assessment performed        Patient interviewed and appropriate assessments performed Provided patient with information about resources available to assist with health and well being and meeting gaps in [facility] and care guides to assist with changes  Discussed plans with patient for ongoing care management follow up and provided patient with direct contact information for care management team Advised patient to call the office for changes in [place], new questions or concerns       COPD: (Status: Goal on Track (progressing): YES.) Long Term Goal  Reviewed medications with patient, including use of prescribed maintenance and rescue inhalers, and provided instruction on medication management and the importance of adherence. 06-04-2021: Has a rescue inhaler if needed. Has not had to use. Provided patient with basic written and verbal COPD education on self care/management/and exacerbation prevention; Advised patient to track and manage COPD triggers. 06-04-2021: The patient knows the triggers and denies any exacerbations or sx and sx of infection  Provided written and verbal instructions on pursed lip breathing and utilized returned demonstration as teach back; Provided instruction about proper use of medications used for management of COPD including inhalers; Advised patient to self assesses COPD action plan zone and make appointment with provider if in the yellow zone for 48 hours without improvement; Advised patient to engage in light exercise as tolerated 3-5 days a week to aid in the the management of COPD; Provided education about and advised patient to utilize infection prevention strategies to reduce risk of respiratory infection; Discussed the importance of adequate rest and management of fatigue with COPD. 06-04-2021: The patient is active and rest when needed, paces active,  knows limitation; Screening for signs and symptoms of depression related to chronic disease state;  Assessed social determinant  of health barriers;    Anxiety  (Status: Goal on Track (progressing): YES.) Long Term Goal  Evaluation of current treatment plan related to Anxiety, Mental Health Concerns  self-management and patient's adherence to plan as established by provider. 06-04-2021: The patient is doing well and denies any new concerns or exacerbations of anxiety.  Discussed plans with patient for ongoing care management follow up and provided patient with direct contact information for care management team Advised patient to call the office for changes in mood, anxiety, or depression ; Provided education to patient re: talking about new anxieties, the patient is feeling much better since he has a stable plan of care for his AAA. The patient is mindful of his alcohol use and is working on reducing his smoking. He states he wants to be healthy and is working on improving his eating habits and getting more exercise. He is thankful for the support of the CCM team. 06-04-2021: Has a good support system in place. Denies any new needs. Will call for changes.  Reviewed scheduled/upcoming provider appointments including 11-13-2021 at 10 am,; Discussed plans with patient for ongoing care management follow up and provided patient with direct contact information for care management team; Screening for signs and symptoms of depression related to chronic disease state;  Assessed social determinant of health barriers;    Hyperlipidemia:  (Status: Goal on Track (progressing): YES.)      Lab Results  Component Value Date    CHOL 119 05/05/2021    HDL 32 (L) 05/05/2021    LDLCALC 48 05/05/2021    TRIG 246 (H) 05/05/2021    CHOLHDL 3.7 05/05/2021      Medication review performed; medication list updated in electronic medical record.  Provider established cholesterol goals reviewed; Counseled on importance of regular laboratory monitoring as prescribed; Provided HLD educational materials; Reviewed role and benefits of statin for ASCVD  risk reduction; Discussed strategies to manage statin-induced myalgias; Reviewed importance of limiting foods high in cholesterol. 06-04-2021: Review of heart healthy diet and foods to avoid Reviewed exercise goals and target of 150 minutes per week;   Hypertension: (Status: Goal on Track (progressing): YES.) Last practice recorded BP readings:     BP Readings from Last 3 Encounters:  05/15/21 128/70  02/27/21 131/81  02/10/21 134/77  Most recent eGFR/CrCl:       Lab Results  Component Value Date    EGFR 94 05/05/2021    No components found for: CRCL   Evaluation of current treatment plan related to hypertension self management and patient's adherence to plan as established by provider. 06-04-2021: The patient denies any issues with HTN or heart health. Feels he is doing very well with management of his chronic conditions;   Provided education to patient re: stroke prevention, s/s of heart attack and stroke; Reviewed prescribed diet heart healthy Reviewed medications with patient and discussed importance of compliance;  Counseled on adverse effects of illicit drug and excessive alcohol use in patients with high blood pressure;  Discussed plans with patient for ongoing care management follow up and provided patient with direct contact information for care management team; Advised patient, providing education and rationale, to monitor blood pressure daily and record, calling PCP for findings outside established parameters;  Advised patient to discuss blood pressure trends with provider; Provided education on prescribed diet heart healthy;  Discussed complications of poorly controlled blood pressure  such as heart disease, stroke, circulatory complications, vision complications, kidney impairment, sexual dysfunction;    Smoking Cessation: (Status: Goal on Track (progressing): YES.) Long Term Goal  Reviewed smoking history:  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 06-04-2021: The patient is slowly cutting back. He knows it would be beneficial for him to stop but admits it is not easy. Knows that he has resources available to help with smoking cessation. Education and support given; Advised patient to discuss smoking cessation options with provider; Provided contact information for [place] [facility] (1-800-QUIT-NOW); Reviewed scheduled/upcoming provider appointments including: 05-15-2021 at 0900 am ; Discussed plans with patient for ongoing care management follow up and provided patient with direct contact information for care management team;     AAA  (Status: Goal on Track (progressing): YES.) Long Term Goal  Evaluation of current treatment plan related to  Ascending aortic aneurysm AAA ,  self-management and patient's adherence to plan as established by provider. 06-04-2021: The patient states he is doing well. He said February will be the 6 month point and they will schedule another CT for evaluation of the AAA. He goes back to the specialist at [facility] the end of January and will follow up accordingly with other specialist.  Discussed plans with patient for ongoing care management follow up and provided patient with direct contact information for care management team Advised patient to call the office for changes in condition, questions, or concerns. The patient has seen specialist recently and he is in the surviellence mode of keeping a check on his AAA. It is 4.8 cm and they do not do surgery until it is 5.5 cm.   The patient has clear instructios on how to effectively manage his chronic conditions ; Provided education to patient re: taking medications as ordered, keeping appointments, not doing any heavy lifting, smoking cessation, safety, controlling cholesterol and blood pressures and being mindful of acute changes in his health and well being; Reviewed medications with patient and discussed compliance ; Reviewed scheduled/upcoming provider appointments including 11-13-2021 at 10 am; Discussed plans with patient for ongoing care management follow up and provided patient with direct contact information for care management team;    Patient Goals/Self-Care Activities: Patient will self administer medications as prescribed as evidenced by self report/primary caregiver report  Patient will attend all scheduled provider appointments as evidenced by clinician review of documented attendance to scheduled appointments and patient/caregiver report Patient will call pharmacy for medication refills as evidenced by patient report and review of pharmacy fill history as appropriate Patient will attend church or other social activities as evidenced by patient report Patient will continue to perform ADL's independently as evidenced by patient/caregiver report Patient will continue to perform IADL's independently as evidenced by patient/caregiver report Patient will call provider office for new concerns or questions as evidenced by review of documented incoming telephone call notes and patient report Patient will work with BSW to address care coordination needs and will continue to work with the clinical team to address health care  and disease management related needs as evidenced by documented adherence to scheduled care management/care coordination appointments - check blood pressure 3 times per week - choose a place to take my blood pressure (home, clinic or office, retail store) - write blood pressure results in a log or  diary - learn about high blood pressure - keep a blood pressure log - take blood pressure log to all doctor appointments - call doctor for signs and symptoms of high blood pressure - develop an action plan for high blood pressure - keep all doctor appointments - take medications for blood pressure exactly as prescribed - report new symptoms to your doctor - eat more whole grains, fruits and vegetables, lean meats and healthy fats - call for medicine refill 2 or 3 days before it runs out - take all medications exactly as prescribed - call doctor with any symptoms you believe are related to your medicine - call doctor when you experience any new symptoms - go to all doctor appointments as scheduled - adhere to prescribed diet: Heart Healthy diet       Our next appointment is by telephone on 08-12-2021 at 1:45 pm   Please call the care guide team at [phone] if you need to cancel or reschedule your appointment.   If you are experiencing a Mental Health or [place] or need someone to talk to, please call the Suicide and Crisis Lifeline: 988 call the [place] National Suicide Prevention Lifeline: (505)350-4986 or TTY: (909)834-8066 TTY 928-399-2633) to talk to a trained counselor call 1-800-273-TALK (toll free, 24 hour hotline)   The patient verbalized understanding of instructions, educational materials, and care plan provided today and declined offer to receive copy of patient instructions, educational materials, and care plan.   Noreene Larsson RN, MSN, [facility] Mobile: [phone]

## 2021-06-04 NOTE — Chronic Care Management (AMB) (Signed)
Chronic Care Management   CCM RN Visit Note  06/04/2021 Name: Carl Allen MRN: [id number] DOB: 09/27/1945  Subjective: Carl Allen is a 76 y.o. year old male who is a primary care patient of [name], [name], MD. The care management team was consulted for assistance with disease management and care coordination needs.    Engaged with patient by telephone for follow up visit in response to provider referral for case management and/or care coordination services.   Consent to Services:  The patient was given information about Chronic Care Management services, agreed to services, and gave verbal consent prior to initiation of services.  Please see initial visit note for detailed documentation.   Patient agreed to services and verbal consent obtained.   Assessment: Review of patient past medical history, allergies, medications, health status, including review of consultants reports, laboratory and other test data, was performed as part of comprehensive evaluation and provision of chronic care management services.   SDOH (Social Determinants of Health) assessments and interventions performed:    CCM Care Plan  No Known Allergies  Outpatient Encounter Medications as of 06/04/2021  Medication Sig   albuterol (VENTOLIN HFA) 108 (90 Base) MCG/ACT inhaler Inhale 2 puffs into the lungs every 6 (six) hours as needed for wheezing or shortness of breath.   amLODipine (NORVASC) 5 MG tablet Take 1 tablet (5 mg total) by mouth daily.   atorvastatin (LIPITOR) 80 MG tablet Take 1 tablet (80 mg total) by mouth daily.   benazepril (LOTENSIN) 40 MG tablet Take 1 tablet (40 mg total) by mouth daily.   cetirizine (ZYRTEC) 10 MG tablet Take 10 mg by mouth daily as needed for allergies.   fenofibrate micronized (LOFIBRA) 134 MG capsule Take 1 capsule (134 mg total) by mouth daily before breakfast.   ferrous sulfate 325 (65 FE) MG tablet Take 325 mg by mouth daily with breakfast.   hydrochlorothiazide  (HYDRODIURIL) 25 MG tablet Take 1 tablet (25 mg total) by mouth daily.   Multiple Vitamins-Minerals (CENTRUM SILVER 50+MEN) TABS Take 1 tablet by mouth daily.   nicotine (NICODERM CQ) 21 mg/24hr patch Place 1 patch (21 mg total) onto the skin daily.   tiotropium (SPIRIVA HANDIHALER) 18 MCG inhalation capsule Place 1 capsule (18 mcg total) into inhaler and inhale every morning.   Triamcinolone Acetonide (TRIAMCINOLONE 0.1 % CREAM : EUCERIN) CREA Apply 1 application topically 3 (three) times daily as needed. Use only on eczematous rash   No facility-administered encounter medications on file as of 06/04/2021.    Patient Active Problem List   Diagnosis Date Noted   Hypercholesteremia 05/15/2021   Need for influenza vaccination 02/10/2021   Aortic aneurysm without rupture ([place]) 02/10/2021   Tobacco abuse 02/10/2021   Prostate cancer ([place]) 08/26/2017   Advanced care planning/counseling discussion 01/12/2017   Chronic alcoholism ([place]) 07/14/2016   Eczema 01/08/2016   COPD (chronic obstructive pulmonary disease) ([place]) 01/07/2015   Hyperlipidemia 01/07/2015   Essential hypertension 11/09/2014   Alcohol abuse counseling and surveillance 11/09/2014   History of atrial fibrillation 10/12/2013    Conditions to be addressed/monitored:HTN, HLD, COPD, Anxiety, Tobacco Use, and AAA  Care Plan : RNCM: General Plan of Care (Adult) for Chronic Disease Management and Care Coordination Needs  Updates made by Vanita Ingles, RN since 06/04/2021 12:00 AM     Problem: RNCM: Development of Plan of Care for Chronic Disease Management (HTN, HLD, COPD, AAA, Anxiety, Smoker)   Priority: High     Long-Range Goal: RNCM: Effective  Management of Plan of Care for Chronic Disease Management (HTN, HLD, COPD, AAA, Anxiety, Smoker)   Start Date: 04/02/2021  Expected End Date: 04/02/2022  Priority: High  Note:   Current Barriers:  Knowledge Deficits related to plan of care for management of HTN, HLD, COPD, Anxiety with  Excessive Worry,, and AAA and smoker   Chronic Disease Management support and education needs related to HTN, HLD, COPD, Anxiety with Excessive Worry,, and AAA and smoker Lacks caregiver support.         RNCM Clinical Goal(s):  Patient will verbalize understanding of plan for management of HTN, HLD, COPD, Anxiety, Tobacco Use, and AAA as evidenced by keeping appointments, following plan of care, and taking medications as directed  take all medications exactly as prescribed and will call provider for medication related questions as evidenced by compliance with medications     attend all scheduled medical appointments: 11-13-2021 at 10 am as evidenced by keeping appointments         demonstrate improved and ongoing adherence to prescribed treatment plan for HTN, HLD, COPD, Anxiety, Tobacco Use, and AAA as evidenced by increasing activity level, calling the office for changes in the plan of care and working with the CCM team to optimize health and well being demonstrate a decrease in HTN, HLD, COPD, Anxiety, Tobacco Use, and AAA exacerbations  as evidenced by no acute changes in health and well being, keeping all appointments with pcp and specialist and calling for changes  demonstrate ongoing self health care management ability for effective management of chronic conditions as evidenced by working with the CCM team     through collaboration with Consulting civil engineer, provider, and care team.   Interventions: 1:1 collaboration with primary care provider regarding development and update of comprehensive plan of care as evidenced by provider attestation and co-signature Inter-disciplinary care team collaboration (see longitudinal plan of care) Evaluation of current treatment plan related to  self management and patient's adherence to plan as established by provider   SDOH Barriers (Status: Goal on Track (progressing): YES.) Long Term Goal  Patient interviewed and SDOH assessment performed        Patient  interviewed and appropriate assessments performed Provided patient with information about resources available to assist with health and well being and meeting gaps in [place] and care guides to assist with changes  Discussed plans with patient for ongoing care management follow up and provided patient with direct contact information for care management team Advised patient to call the office for changes in [place], new questions or concerns    COPD: (Status: Goal on Track (progressing): YES.) Long Term Goal  Reviewed medications with patient, including use of prescribed maintenance and rescue inhalers, and provided instruction on medication management and the importance of adherence. 06-04-2021: Has a rescue inhaler if needed. Has not had to use. Provided patient with basic written and verbal COPD education on self care/management/and exacerbation prevention; Advised patient to track and manage COPD triggers. 06-04-2021: The patient knows the triggers and denies any exacerbations or sx and sx of infection  Provided written and verbal instructions on pursed lip breathing and utilized returned demonstration as teach back; Provided instruction about proper use of medications used for management of COPD including inhalers; Advised patient to self assesses COPD action plan zone and make appointment with provider if in the yellow zone for 48 hours without improvement; Advised patient to engage in light exercise as tolerated 3-5 days a week to aid in the the management  of COPD; Provided education about and advised patient to utilize infection prevention strategies to reduce risk of respiratory infection; Discussed the importance of adequate rest and management of fatigue with COPD. 06-04-2021: The patient is active and rest when needed, paces active, knows limitation; Screening for signs and symptoms of depression related to chronic disease state;  Assessed social determinant of health barriers;   Anxiety  (Status:  Goal on Track (progressing): YES.) Long Term Goal  Evaluation of current treatment plan related to Anxiety, Mental Health Concerns  self-management and patient's adherence to plan as established by provider. 06-04-2021: The patient is doing well and denies any new concerns or exacerbations of anxiety.  Discussed plans with patient for ongoing care management follow up and provided patient with direct contact information for care management team Advised patient to call the office for changes in mood, anxiety, or depression ; Provided education to patient re: talking about new anxieties, the patient is feeling much better since he has a stable plan of care for his AAA. The patient is mindful of his alcohol use and is working on reducing his smoking. He states he wants to be healthy and is working on improving his eating habits and getting more exercise. He is thankful for the support of the CCM team. 06-04-2021: Has a good support system in place. Denies any new needs. Will call for changes.  Reviewed scheduled/upcoming provider appointments including 11-13-2021 at 10 am,; Discussed plans with patient for ongoing care management follow up and provided patient with direct contact information for care management team; Screening for signs and symptoms of depression related to chronic disease state;  Assessed social determinant of health barriers;   Hyperlipidemia:  (Status: Goal on Track (progressing): YES.) Lab Results  Component Value Date   CHOL 119 05/05/2021   HDL 32 (L) 05/05/2021   LDLCALC 48 05/05/2021   TRIG 246 (H) 05/05/2021   CHOLHDL 3.7 05/05/2021     Medication review performed; medication list updated in electronic medical record.  Provider established cholesterol goals reviewed; Counseled on importance of regular laboratory monitoring as prescribed; Provided HLD educational materials; Reviewed role and benefits of statin for ASCVD risk reduction; Discussed strategies to manage  statin-induced myalgias; Reviewed importance of limiting foods high in cholesterol. 06-04-2021: Review of heart healthy diet and foods to avoid Reviewed exercise goals and target of 150 minutes per week;  Hypertension: (Status: Goal on Track (progressing): YES.) Last practice recorded BP readings:  BP Readings from Last 3 Encounters:  05/15/21 128/70  02/27/21 131/81  02/10/21 134/77  Most recent eGFR/CrCl:  Lab Results  Component Value Date   EGFR 94 05/05/2021    No components found for: CRCL  Evaluation of current treatment plan related to hypertension self management and patient's adherence to plan as established by provider. 06-04-2021: The patient denies any issues with HTN or heart health. Feels he is doing very well with management of his chronic conditions;   Provided education to patient re: stroke prevention, s/s of heart attack and stroke; Reviewed prescribed diet heart healthy Reviewed medications with patient and discussed importance of compliance;  Counseled on adverse effects of illicit drug and excessive alcohol use in patients with high blood pressure;  Discussed plans with patient for ongoing care management follow up and provided patient with direct contact information for care management team; Advised patient, providing education and rationale, to monitor blood pressure daily and record, calling PCP for findings outside established parameters;  Advised patient to discuss blood pressure  trends with provider; Provided education on prescribed diet heart healthy;  Discussed complications of poorly controlled blood pressure such as heart disease, stroke, circulatory complications, vision complications, kidney impairment, sexual dysfunction;   Smoking Cessation: (Status: Goal on Track (progressing): YES.) Long Term Goal  Reviewed smoking history:  tobacco abuse of >60 years; currently smoking 0.75 ppd, decreased from 1.5 ppd Previous quit attempts, unsuccessful several  successful using quitting cold [place]  Reports smoking within 30 minutes of waking up Reports triggers to smoke include: stress, anxiety, habit Reports motivation to quit smoking includes: knows it would help his over all health and well being  On a scale of 1-10, reports MOTIVATION to quit is 8 On a scale of 1-10, reports CONFIDENCE in quitting is 7  Evaluation of current treatment plan reviewed. 06-04-2021: The patient is slowly cutting back. He knows it would be beneficial for him to stop but admits it is not easy. Knows that he has resources available to help with smoking cessation. Education and support given; Advised patient to discuss smoking cessation options with provider; Provided contact information for [place] [facility]uit [facility] (1-800-QUIT-NOW); Reviewed scheduled/upcoming provider appointments including: 05-15-2021 at 0900 am ; Discussed plans with patient for ongoing care management follow up and provided patient with direct contact information for care management team;   AAA  (Status: Goal on Track (progressing): YES.) Long Term Goal  Evaluation of current treatment plan related to  Ascending aortic aneurysm AAA ,  self-management and patient's adherence to plan as established by provider. 06-04-2021: The patient states he is doing well. He said February will be the 6 month point and they will schedule another CT for evaluation of the AAA. He goes back to the specialist at [facility] the end of January and will follow up accordingly with other specialist.  Discussed plans with patient for ongoing care management follow up and provided patient with direct contact information for care management team Advised patient to call the office for changes in condition, questions, or concerns. The patient has seen specialist recently and he is in the surviellence mode of keeping a check on his AAA. It is 4.8 cm and they do not do surgery until it is 5.5 cm.  The patient has clear instructios on how to effectively manage  his chronic conditions ; Provided education to patient re: taking medications as ordered, keeping appointments, not doing any heavy lifting, smoking cessation, safety, controlling cholesterol and blood pressures and being mindful of acute changes in his health and well being; Reviewed medications with patient and discussed compliance ; Reviewed scheduled/upcoming provider appointments including 11-13-2021 at 10 am; Discussed plans with patient for ongoing care management follow up and provided patient with direct contact information for care management team;   Patient Goals/Self-Care Activities: Patient will self administer medications as prescribed as evidenced by self report/primary caregiver report  Patient will attend all scheduled provider appointments as evidenced by clinician review of documented attendance to scheduled appointments and patient/caregiver report Patient will call pharmacy for medication refills as evidenced by patient report and review of pharmacy fill history as appropriate Patient will attend church or other social activities as evidenced by patient report Patient will continue to perform ADL's independently as evidenced by patient/caregiver report Patient will continue to perform IADL's independently as evidenced by patient/caregiver report Patient will call provider office for new concerns or questions as evidenced by review of documented incoming telephone call notes and patient report Patient will work with BSW to address care coordination needs  and will continue to work with the clinical team to address health care and disease management related needs as evidenced by documented adherence to scheduled care management/care coordination appointments - check blood pressure 3 times per week - choose a place to take my blood pressure (home, clinic or office, retail store) - write blood pressure results in a log or diary - learn about high blood pressure - keep a blood  pressure log - take blood pressure log to all doctor appointments - call doctor for signs and symptoms of high blood pressure - develop an action plan for high blood pressure - keep all doctor appointments - take medications for blood pressure exactly as prescribed - report new symptoms to your doctor - eat more whole grains, fruits and vegetables, lean meats and healthy fats - call for medicine refill 2 or 3 days before it runs out - take all medications exactly as prescribed - call doctor with any symptoms you believe are related to your medicine - call doctor when you experience any new symptoms - go to all doctor appointments as scheduled - adhere to prescribed diet: Heart Healthy diet        Plan:Telephone follow up appointment with care management team member scheduled for:  08-12-2021 at 1:45 pm  Noreene Larsson RN, MSN, [facility] Mobile: [phone]

## 2021-06-13 ENCOUNTER — Other Ambulatory Visit: Payer: Self-pay | Admitting: Thoracic Surgery (Cardiothoracic Vascular Surgery)

## 2021-06-13 DIAGNOSIS — I7121 Aneurysm of the ascending aorta, without rupture: Secondary | ICD-10-CM

## 2021-06-19 DIAGNOSIS — J42 Unspecified chronic bronchitis: Secondary | ICD-10-CM | POA: Diagnosis not present

## 2021-06-19 DIAGNOSIS — E785 Hyperlipidemia, unspecified: Secondary | ICD-10-CM | POA: Diagnosis not present

## 2021-06-19 DIAGNOSIS — I719 Aortic aneurysm of unspecified site, without rupture: Secondary | ICD-10-CM | POA: Diagnosis not present

## 2021-06-19 DIAGNOSIS — I480 Paroxysmal atrial fibrillation: Secondary | ICD-10-CM | POA: Diagnosis not present

## 2021-06-26 ENCOUNTER — Other Ambulatory Visit: Payer: Self-pay | Admitting: Thoracic Surgery (Cardiothoracic Vascular Surgery)

## 2021-07-01 DIAGNOSIS — E785 Hyperlipidemia, unspecified: Secondary | ICD-10-CM

## 2021-07-01 DIAGNOSIS — E7800 Pure hypercholesterolemia, unspecified: Secondary | ICD-10-CM

## 2021-07-01 DIAGNOSIS — I10 Essential (primary) hypertension: Secondary | ICD-10-CM | POA: Diagnosis not present

## 2021-07-01 DIAGNOSIS — J410 Simple chronic bronchitis: Secondary | ICD-10-CM | POA: Diagnosis not present

## 2021-07-11 ENCOUNTER — Other Ambulatory Visit: Payer: PPO

## 2021-07-11 ENCOUNTER — Other Ambulatory Visit: Payer: Self-pay

## 2021-07-11 DIAGNOSIS — I10 Essential (primary) hypertension: Secondary | ICD-10-CM | POA: Diagnosis not present

## 2021-07-11 DIAGNOSIS — E7800 Pure hypercholesterolemia, unspecified: Secondary | ICD-10-CM

## 2021-07-11 LAB — URINALYSIS, ROUTINE W REFLEX MICROSCOPIC
Bilirubin, UA: NEGATIVE
Glucose, UA: NEGATIVE
Ketones, UA: NEGATIVE
Leukocytes,UA: NEGATIVE
Nitrite, UA: NEGATIVE
Protein,UA: NEGATIVE
RBC, UA: NEGATIVE
Specific Gravity, UA: 1.015 (ref 1.005–1.030)
Urobilinogen, Ur: 0.2 mg/dL (ref 0.2–1.0)
pH, UA: 6.5 (ref 5.0–7.5)

## 2021-07-12 LAB — TSH: TSH: 2.65 u[IU]/mL (ref 0.450–4.500)

## 2021-07-12 LAB — COMPREHENSIVE METABOLIC PANEL
ALT: 24 IU/L (ref 0–44)
AST: 17 IU/L (ref 0–40)
Albumin/Globulin Ratio: 2.2 (ref 1.2–2.2)
Albumin: 4.7 g/dL (ref 3.7–4.7)
Alkaline Phosphatase: 80 IU/L (ref 44–121)
BUN/Creatinine Ratio: 25 — ABNORMAL HIGH (ref 10–24)
BUN: 20 mg/dL (ref 8–27)
Bilirubin Total: 0.5 mg/dL (ref 0.0–1.2)
CO2: 25 mmol/L (ref 20–29)
Calcium: 9.4 mg/dL (ref 8.6–10.2)
Chloride: 104 mmol/L (ref 96–106)
Creatinine, Ser: 0.79 mg/dL (ref 0.76–1.27)
Globulin, Total: 2.1 g/dL (ref 1.5–4.5)
Glucose: 91 mg/dL (ref 70–99)
Potassium: 5.2 mmol/L (ref 3.5–5.2)
Sodium: 142 mmol/L (ref 134–144)
Total Protein: 6.8 g/dL (ref 6.0–8.5)
eGFR: 93 mL/min/{1.73_m2} (ref >59)

## 2021-07-12 LAB — CBC WITH DIFFERENTIAL/PLATELET
Basophils Absolute: 0.1 10*3/uL (ref 0.0–0.2)
Basos: 1 %
EOS (ABSOLUTE): 0.2 10*3/uL (ref 0.0–0.4)
Eos: 2 %
Hematocrit: 45.4 % (ref 37.5–51.0)
Hemoglobin: 15.1 g/dL (ref 13.0–17.7)
Immature Grans (Abs): 0 10*3/uL (ref 0.0–0.1)
Immature Granulocytes: 0 %
Lymphocytes Absolute: 2.2 10*3/uL (ref 0.7–3.1)
Lymphs: 29 %
MCH: 31.5 pg (ref 26.6–33.0)
MCHC: 33.3 g/dL (ref 31.5–35.7)
MCV: 95 fL (ref 79–97)
Monocytes Absolute: 0.6 10*3/uL (ref 0.1–0.9)
Monocytes: 8 %
Neutrophils Absolute: 4.5 10*3/uL (ref 1.4–7.0)
Neutrophils: 60 %
Platelets: 211 10*3/uL (ref 150–450)
RBC: 4.8 x10E6/uL (ref 4.14–5.80)
RDW: 12.6 % (ref 11.6–15.4)
WBC: 7.5 10*3/uL (ref 3.4–10.8)

## 2021-07-12 LAB — LIPID PANEL
Chol/HDL Ratio: 4.1 ratio (ref 0.0–5.0)
Cholesterol, Total: 107 mg/dL (ref 100–199)
HDL: 26 mg/dL — ABNORMAL LOW (ref >39)
LDL Chol Calc (NIH): 41 mg/dL (ref 0–99)
Triglycerides: 261 mg/dL — ABNORMAL HIGH (ref 0–149)
VLDL Cholesterol Cal: 40 mg/dL (ref 5–40)

## 2021-07-17 ENCOUNTER — Other Ambulatory Visit: Payer: Self-pay

## 2021-07-17 ENCOUNTER — Ambulatory Visit
Admission: RE | Admit: 2021-07-17 | Discharge: 2021-07-17 | Disposition: A | Discharge status: Home / Self Care | Payer: PPO | Source: Ambulatory Visit | Attending: Thoracic Surgery (Cardiothoracic Vascular Surgery) | Admitting: Thoracic Surgery (Cardiothoracic Vascular Surgery)

## 2021-07-17 DIAGNOSIS — I7121 Aneurysm of the ascending aorta, without rupture: Secondary | ICD-10-CM | POA: Diagnosis not present

## 2021-07-17 DIAGNOSIS — J439 Emphysema, unspecified: Secondary | ICD-10-CM | POA: Diagnosis not present

## 2021-07-17 MED ORDER — IOHEXOL 350 MG/ML SOLN
75.0000 mL | Freq: Once | INTRAVENOUS | Status: AC | PRN
  Administered 2021-07-17: 75 mL via INTRAVENOUS

## 2021-07-18 ENCOUNTER — Ambulatory Visit (INDEPENDENT_AMBULATORY_CARE_PROVIDER_SITE_OTHER): Payer: PPO | Admitting: Thoracic Surgery (Cardiothoracic Vascular Surgery)

## 2021-07-18 ENCOUNTER — Other Ambulatory Visit: Payer: PPO

## 2021-07-18 DIAGNOSIS — I7121 Aneurysm of the ascending aorta, without rupture: Secondary | ICD-10-CM

## 2021-07-18 NOTE — Progress Notes (Addendum)
°   °  [address]       [place] [address]             [phone]       Patient: Home Provider: Office Consent for Telemedicine visit obtained.  Todays visit was completed via a real-time telehealth (see specific modality noted below). The patient/authorized person provided oral consent at the time of the visit to engage in a telemedicine encounter with the present provider at [facility]. The patient/authorized person was informed of the potential benefits, limitations, and risks of telemedicine. The patient/authorized person expressed understanding that the laws that protect confidentiality also apply to telemedicine. The patient/authorized person acknowledged understanding that telemedicine does not provide emergency services and that he or she would need to call 911 or proceed to the nearest hospital for help if such a need arose.   Total time spent in the clinical discussion 10 minutes.  Telehealth Modality: Phone visit (audio only)  I had a telephone visit with Mr. Montilla.  He is under surveillance for a 4.8 cm ascending aortic aneurysm.  Cross-sectional imaging from earlier this month shows that the aneurysm has been stable.  He continues to take his blood pressure medication and cholesterol medication.  He does also continue to smoke and I reiterated the importance of smoking cessation with him.  He will follow-up in 1 year virtually with another CT aortogram.  He will also get an echocardiogram at that time to evaluate for bicuspid valve.

## 2021-08-12 ENCOUNTER — Telehealth: Payer: Self-pay

## 2021-08-12 ENCOUNTER — Telehealth: Payer: PPO

## 2021-08-12 ENCOUNTER — Ambulatory Visit (INDEPENDENT_AMBULATORY_CARE_PROVIDER_SITE_OTHER): Payer: PPO

## 2021-08-12 DIAGNOSIS — F419 Anxiety disorder, unspecified: Secondary | ICD-10-CM

## 2021-08-12 DIAGNOSIS — E7800 Pure hypercholesterolemia, unspecified: Secondary | ICD-10-CM

## 2021-08-12 DIAGNOSIS — E785 Hyperlipidemia, unspecified: Secondary | ICD-10-CM

## 2021-08-12 DIAGNOSIS — F17200 Nicotine dependence, unspecified, uncomplicated: Secondary | ICD-10-CM

## 2021-08-12 DIAGNOSIS — I10 Essential (primary) hypertension: Secondary | ICD-10-CM

## 2021-08-12 DIAGNOSIS — J449 Chronic obstructive pulmonary disease, unspecified: Secondary | ICD-10-CM

## 2021-08-12 DIAGNOSIS — I719 Aortic aneurysm of unspecified site, without rupture: Secondary | ICD-10-CM

## 2021-08-12 NOTE — Telephone Encounter (Signed)
?  Care Management  ? ?Follow Up Note ? ? ?08/12/2021 ?Name: Carl Allen MRN: [id number] DOB: May 20, 1946 ? ? ?Referred by: Charlynne Cousins, MD ?Reason for referral : Chronic Care Management (RNCM: Follow up for Chronic Disease Management and Care Coordination Needs ) ? ? ?Call returned to the patient and call completed. See new encounter for details.  ? ?Follow Up Plan: Telephone follow up appointment with care management team member scheduled for: Oct 23, 2021 at 1 pm ? ?Noreene Larsson RN, MSN, CCM ?Community Care Coordinator ?[facility] ?[place] ?Mobile: [phone]  ?

## 2021-08-12 NOTE — Patient Instructions (Signed)
Visit Information ? ?Thank you for taking time to visit with me today. Please don't hesitate to contact me if I can be of assistance to you before our next scheduled telephone appointment. ? ?Following are the goals we discussed today:  ?RNCM Clinical Goal(s):  ?Patient will verbalize understanding of plan for management of HTN, HLD, COPD, Anxiety, Tobacco Use, and AAA as evidenced by keeping appointments, following plan of care, and taking medications as directed  ?take all medications exactly as prescribed and will call provider for medication related questions as evidenced by compliance with medications     ?attend all scheduled medical appointments: 11-13-2021 at 10 am as evidenced by keeping appointments         ?demonstrate improved and ongoing adherence to prescribed treatment plan for HTN, HLD, COPD, Anxiety, Tobacco Use, and AAA as evidenced by increasing activity level, calling the office for changes in the plan of care and working with the CCM team to optimize health and well being ?demonstrate a decrease in HTN, HLD, COPD, Anxiety, Tobacco Use, and AAA exacerbations  as evidenced by no acute changes in health and well being, keeping all appointments with pcp and specialist and calling for changes  ?demonstrate ongoing self health care management ability for effective management of chronic conditions as evidenced by working with the CCM team     through collaboration with Consulting civil engineer, provider, and care team.  ?  ?Interventions: ?1:1 collaboration with primary care provider regarding development and update of comprehensive plan of care as evidenced by provider attestation and co-signature ?Inter-disciplinary care team collaboration (see longitudinal plan of care) ?Evaluation of current treatment plan related to  self management and patient's adherence to plan as established by provider ?  ?  ?SDOH Barriers (Status: Goal on Track (progressing): YES.) Long Term Goal  ?Patient interviewed and SDOH  assessment performed ?       ?Patient interviewed and appropriate assessments performed ?Provided patient with information about resources available to assist with health and well being and meeting gaps in [place] and care guides to assist with changes  ?Discussed plans with patient for ongoing care management follow up and provided patient with direct contact information for care management team ?Advised patient to call the office for changes in SDOH, new questions or concerns ?  ?  ?  ?COPD: (Status: Goal on Track (progressing): YES.) Long Term Goal  ?Reviewed medications with patient, including use of prescribed maintenance and rescue inhalers, and provided instruction on medication management and the importance of adherence. 06-04-2021: Has a rescue inhaler if needed. Has not had to use. 08-12-2021: The patient is having issues with allergies but no breathing issues. Has inhalers if needed.  ?Provided patient with basic written and verbal COPD education on self care/management/and exacerbation prevention; ?Advised patient to track and manage COPD triggers. 08-12-2021: The patient knows the triggers and denies any exacerbations or sx and sx of infection  ?Provided written and verbal instructions on pursed lip breathing and utilized returned demonstration as teach back; ?Provided instruction about proper use of medications used for management of COPD including inhalers; ?Advised patient to self assesses COPD action plan zone and make appointment with provider if in the yellow zone for 48 hours without improvement; ?Advised patient to engage in light exercise as tolerated 3-5 days a week to aid in the the management of COPD; ?Provided education about and advised patient to utilize infection prevention strategies to reduce risk of respiratory infection; ?Discussed the importance of adequate rest  and management of fatigue with COPD. 08-12-2021: The patient is active and rest when needed, paces active, knows  limitation; ?Screening for signs and symptoms of depression related to chronic disease state;  ?Assessed social determinant of health barriers;  ?  ?Anxiety  (Status: Goal on Track (progressing): YES.) Long Term Goal  ?Evaluation of current treatment plan related to Anxiety, Mental Health Concerns  self-management and patient's adherence to plan as established by provider. 08-12-2021: The patient is doing well and denies any new concerns or exacerbations of anxiety.  ?Discussed plans with patient for ongoing care management follow up and provided patient with direct contact information for care management team ?Advised patient to call the office for changes in mood, anxiety, or depression ; ?Provided education to patient re: talking about new anxieties, the patient is feeling much better since he has a stable plan of care for his AAA. The patient is mindful of his alcohol use and is working on reducing his smoking. He states he wants to be healthy and is working on improving his eating habits and getting more exercise. He is thankful for the support of the CCM team. 08-12-2021: Has a good support system in place. Denies any new needs. Will call for changes.  ?Reviewed scheduled/upcoming provider appointments including 11-13-2021 at 10 am,; ?Discussed plans with patient for ongoing care management follow up and provided patient with direct contact information for care management team; ?Screening for signs and symptoms of depression related to chronic disease state;  ?Assessed social determinant of health barriers;  ?  ?Hyperlipidemia:  (Status: Goal on Track (progressing): YES.) ?     ?Lab Results  ?Component Value Date  ?  CHOL 107 07/11/2021  ?  HDL 26 (L) 07/11/2021  ?  [place] 41 07/11/2021  ?  TRIG 261 (H) 07/11/2021  ?  CHOLHDL 4.1 07/11/2021  ?  ?  ?Medication review performed; medication list updated in electronic medical record. 08-12-2021: Takes atorvastatin 80 mg QD. Denies any issues with medications  compliance  ?Provider established cholesterol goals reviewed; ?Counseled on importance of regular laboratory monitoring as prescribed. 08-12-2021: The patient has regular lab testing ?Provided HLD educational materials; ?Reviewed role and benefits of statin for ASCVD risk reduction; ?Discussed strategies to manage statin-induced myalgias; ?Reviewed importance of limiting foods high in cholesterol. 08-12-2021: Review of heart healthy diet and foods to avoid ?Reviewed exercise goals and target of 150 minutes per week; ?  ?Hypertension: (Status: Goal on Track (progressing): YES.) ?Last practice recorded BP readings:  ?   ?BP Readings from Last 3 Encounters:  ?05/15/21 128/70  ?02/27/21 131/81  ?02/10/21 134/77  ?Most recent eGFR/CrCl:  ?     ?Lab Results  ?Component Value Date  ?  EGFR 93 07/11/2021  ?  No components found for: CRCL ?  ?Evaluation of current treatment plan related to hypertension self management and patient's adherence to plan as established by provider. 08-12-2021: The patient denies any issues with HTN or heart health. Feels he is doing very well with management of his chronic conditions;   ?Provided education to patient re: stroke prevention, s/s of heart attack and stroke; ?Reviewed prescribed diet heart healthy ?Reviewed medications with patient and discussed importance of compliance;  ?Counseled on adverse effects of illicit drug and excessive alcohol use in patients with high blood pressure;  ?Discussed plans with patient for ongoing care management follow up and provided patient with direct contact information for care management team; ?Advised patient, providing education and rationale, to monitor blood  pressure daily and record, calling PCP for findings outside established parameters;  ?Advised patient to discuss blood pressure trends with provider; ?Provided education on prescribed diet heart healthy;  ?Discussed complications of poorly controlled blood pressure such as heart disease, stroke,  circulatory complications, vision complications, kidney impairment, sexual dysfunction;  ?  ?Smoking Cessation: (Status: Goal on Track (progressing): YES.) Long Term Goal  ?Reviewed smoking history:  ?tobacco abuse of >60 years; curr

## 2021-08-12 NOTE — Chronic Care Management (AMB) (Signed)
?Chronic Care Management  ? ?CCM RN Visit Note ? ?08/12/2021 ?Name: Carl Allen MRN: [id number] DOB: 1945-09-27 ? ?Subjective: ?Carl Allen is a 76 y.o. year old male who is a primary care patient of [name], [name], MD. The care management team was consulted for assistance with disease management and care coordination needs.   ? ?Engaged with patient by telephone for follow up visit in response to provider referral for case management and/or care coordination services.  ? ?Consent to Services:  ?The patient was given information about Chronic Care Management services, agreed to services, and gave verbal consent prior to initiation of services.  Please see initial visit note for detailed documentation.  ? ?Patient agreed to services and verbal consent obtained.  ? ?Assessment: Review of patient past medical history, allergies, medications, health status, including review of consultants reports, laboratory and other test data, was performed as part of comprehensive evaluation and provision of chronic care management services.  ? ?SDOH (Social Determinants of Health) assessments and interventions performed:   ? ?CCM Care Plan ? ?No Known Allergies ? ?Outpatient Encounter Medications as of 08/12/2021  ?Medication Sig  ? albuterol (VENTOLIN HFA) 108 (90 Base) MCG/ACT inhaler Inhale 2 puffs into the lungs every 6 (six) hours as needed for wheezing or shortness of breath.  ? amLODipine (NORVASC) 5 MG tablet Take 1 tablet (5 mg total) by mouth daily.  ? atorvastatin (LIPITOR) 80 MG tablet Take 1 tablet (80 mg total) by mouth daily.  ? benazepril (LOTENSIN) 40 MG tablet Take 1 tablet (40 mg total) by mouth daily.  ? cetirizine (ZYRTEC) 10 MG tablet Take 10 mg by mouth daily as needed for allergies.  ? fenofibrate micronized (LOFIBRA) 134 MG capsule Take 1 capsule (134 mg total) by mouth daily before breakfast.  ? ferrous sulfate 325 (65 FE) MG tablet Take 325 mg by mouth daily with breakfast.  ? hydrochlorothiazide  (HYDRODIURIL) 25 MG tablet Take 1 tablet (25 mg total) by mouth daily.  ? Multiple Vitamins-Minerals (CENTRUM SILVER 50+MEN) TABS Take 1 tablet by mouth daily.  ? nicotine (NICODERM CQ) 21 mg/24hr patch Place 1 patch (21 mg total) onto the skin daily.  ? tiotropium (SPIRIVA HANDIHALER) 18 MCG inhalation capsule Place 1 capsule (18 mcg total) into inhaler and inhale every morning.  ? Triamcinolone Acetonide (TRIAMCINOLONE 0.1 % CREAM : EUCERIN) CREA Apply 1 application topically 3 (three) times daily as needed. Use only on eczematous rash  ? ?No facility-administered encounter medications on file as of 08/12/2021.  ? ? ?Patient Active Problem List  ? Diagnosis Date Noted  ? Hypercholesteremia 05/15/2021  ? Need for influenza vaccination 02/10/2021  ? Aortic aneurysm without rupture ([place]) 02/10/2021  ? Tobacco abuse 02/10/2021  ? Prostate cancer ([place]) 08/26/2017  ? Advanced care planning/counseling discussion 01/12/2017  ? Chronic alcoholism ([place]) 07/14/2016  ? Eczema 01/08/2016  ? COPD (chronic obstructive pulmonary disease) ([place]) 01/07/2015  ? Hyperlipidemia 01/07/2015  ? Essential hypertension 11/09/2014  ? Alcohol abuse counseling and surveillance 11/09/2014  ? History of atrial fibrillation 10/12/2013  ? ? ?Conditions to be addressed/monitored:HTN, HLD, COPD, Anxiety, Tobacco Use, and AAA ? ?Care Plan : RNCM: General Plan of Care (Adult) for Chronic Disease Management and Care Coordination Needs  ?Updates made by Vanita Ingles, RN since 08/12/2021 12:00 AM  ?  ? ?Problem: RNCM: Development of Plan of Care for Chronic Disease Management (HTN, HLD, COPD, AAA, Anxiety, Smoker)   ?Priority: High  ?  ? ?Long-Range Goal: RNCM: Effective  Management of Plan of Care for Chronic Disease Management (HTN, HLD, COPD, AAA, Anxiety, Smoker)   ?Start Date: 04/02/2021  ?Expected End Date: 04/02/2022  ?Priority: High  ?Note:   ?Current Barriers:  ?Knowledge Deficits related to plan of care for management of HTN, HLD, COPD, Anxiety  with Excessive Worry,, and AAA and smoker   ?Chronic Disease Management support and education needs related to HTN, HLD, COPD, Anxiety with Excessive Worry,, and AAA and smoker ?Lacks caregiver support.        ? ?RNCM Clinical Goal(s):  ?Patient will verbalize understanding of plan for management of HTN, HLD, COPD, Anxiety, Tobacco Use, and AAA as evidenced by keeping appointments, following plan of care, and taking medications as directed  ?take all medications exactly as prescribed and will call provider for medication related questions as evidenced by compliance with medications     ?attend all scheduled medical appointments: 11-13-2021 at 10 am as evidenced by keeping appointments         ?demonstrate improved and ongoing adherence to prescribed treatment plan for HTN, HLD, COPD, Anxiety, Tobacco Use, and AAA as evidenced by increasing activity level, calling the office for changes in the plan of care and working with the CCM team to optimize health and well being ?demonstrate a decrease in HTN, HLD, COPD, Anxiety, Tobacco Use, and AAA exacerbations  as evidenced by no acute changes in health and well being, keeping all appointments with pcp and specialist and calling for changes  ?demonstrate ongoing self health care management ability for effective management of chronic conditions as evidenced by working with the CCM team     through collaboration with Consulting civil engineer, provider, and care team.  ? ?Interventions: ?1:1 collaboration with primary care provider regarding development and update of comprehensive plan of care as evidenced by provider attestation and co-signature ?Inter-disciplinary care team collaboration (see longitudinal plan of care) ?Evaluation of current treatment plan related to  self management and patient's adherence to plan as established by provider ? ? ?SDOH Barriers (Status: Goal on Track (progressing): YES.) Long Term Goal  ?Patient interviewed and SDOH assessment performed ?        ?Patient interviewed and appropriate assessments performed ?Provided patient with information about resources available to assist with health and well being and meeting gaps in [place] and care guides to assist with changes  ?Discussed plans with patient for ongoing care management follow up and provided patient with direct contact information for care management team ?Advised patient to call the office for changes in SDOH, new questions or concerns ? ? ? ?COPD: (Status: Goal on Track (progressing): YES.) Long Term Goal  ?Reviewed medications with patient, including use of prescribed maintenance and rescue inhalers, and provided instruction on medication management and the importance of adherence. 06-04-2021: Has a rescue inhaler if needed. Has not had to use. 08-12-2021: The patient is having issues with allergies but no breathing issues. Has inhalers if needed.  ?Provided patient with basic written and verbal COPD education on self care/management/and exacerbation prevention; ?Advised patient to track and manage COPD triggers. 08-12-2021: The patient knows the triggers and denies any exacerbations or sx and sx of infection  ?Provided written and verbal instructions on pursed lip breathing and utilized returned demonstration as teach back; ?Provided instruction about proper use of medications used for management of COPD including inhalers; ?Advised patient to self assesses COPD action plan zone and make appointment with provider if in the yellow zone for 48 hours without improvement; ?Advised patient  to engage in light exercise as tolerated 3-5 days a week to aid in the the management of COPD; ?Provided education about and advised patient to utilize infection prevention strategies to reduce risk of respiratory infection; ?Discussed the importance of adequate rest and management of fatigue with COPD. 08-12-2021: The patient is active and rest when needed, paces active, knows limitation; ?Screening for signs and symptoms of  depression related to chronic disease state;  ?Assessed social determinant of health barriers;  ? ?Anxiety  (Status: Goal on Track (progressing): YES.) Long Term Goal  ?Evaluation of current treatment plan related t

## 2021-08-29 DIAGNOSIS — E785 Hyperlipidemia, unspecified: Secondary | ICD-10-CM | POA: Diagnosis not present

## 2021-08-29 DIAGNOSIS — E7800 Pure hypercholesterolemia, unspecified: Secondary | ICD-10-CM | POA: Diagnosis not present

## 2021-08-29 DIAGNOSIS — I10 Essential (primary) hypertension: Secondary | ICD-10-CM

## 2021-08-29 DIAGNOSIS — J449 Chronic obstructive pulmonary disease, unspecified: Secondary | ICD-10-CM

## 2021-09-10 ENCOUNTER — Other Ambulatory Visit: Payer: Self-pay

## 2021-09-10 DIAGNOSIS — C61 Malignant neoplasm of prostate: Secondary | ICD-10-CM

## 2021-09-11 ENCOUNTER — Other Ambulatory Visit: Payer: PPO

## 2021-09-11 DIAGNOSIS — C61 Malignant neoplasm of prostate: Secondary | ICD-10-CM

## 2021-09-12 LAB — PSA: Prostate Specific Ag, Serum: 0.4 ng/mL (ref 0.0–4.0)

## 2021-09-18 ENCOUNTER — Encounter: Payer: Self-pay | Admitting: Radiation Oncology

## 2021-09-18 ENCOUNTER — Ambulatory Visit
Admission: RE | Admit: 2021-09-18 | Discharge: 2021-09-18 | Disposition: A | Discharge status: Home / Self Care | Payer: PPO | Source: Ambulatory Visit | Attending: Radiation Oncology | Admitting: Radiation Oncology

## 2021-09-18 VITALS — BP 154/75 | HR 68 | Temp 97.1°F | Resp 18 | Ht 67.0 in | Wt 151.3 lb

## 2021-09-18 DIAGNOSIS — C61 Malignant neoplasm of prostate: Secondary | ICD-10-CM | POA: Diagnosis not present

## 2021-09-18 DIAGNOSIS — Z923 Personal history of irradiation: Secondary | ICD-10-CM | POA: Diagnosis not present

## 2021-09-18 DIAGNOSIS — Z08 Encounter for follow-up examination after completed treatment for malignant neoplasm: Secondary | ICD-10-CM | POA: Diagnosis not present

## 2021-09-18 NOTE — Progress Notes (Signed)
Radiation Oncology ?Follow up Note ? ?Name: Carl Allen   ?Date:   09/18/2021 ?MRN:  [id number] ?DOB: [birth year]  ? ? ?This 76 y.o. 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 ? ?REFERRING PROVIDER: [name], [name], MD ? ?HPI: Patient is a 76 year old male now out close to 4 years having completed I-125 interstitial implant for a Gleason 6 adenocarcinoma the prostate presenting with a PSA of 5.  Seen today in routine follow-up he is doing well specifically denies any increased lower [place]eri tract symptoms diarrhea or fatigue.  His most recent PSA is 0.4 stable over the last several years.. ? ?COMPLICATIONS OF TREATMENT: none ? ?FOLLOW UP COMPLIANCE: keeps appointments  ? ?PHYSICAL EXAM:  ?BP (!) 154/75 (BP Location: Left Arm, Patient Position: Sitting)   Pulse 68   Temp (!) 97.1 ?F (36.2 ?C) (Tympanic)   Resp 18   Ht '5\' 7"'$  (1.702 m)   Wt 151 lb 4.8 oz (68.6 kg)   BMI 23.70 kg/m?  ?Well-developed well-nourished patient in NAD. HEENT reveals PERLA, EOMI, discs not visualized.  Oral cavity is clear. No oral mucosal lesions are identified. Neck is clear without evidence of cervical or supraclavicular adenopathy. Lungs are clear to A&P. Cardiac examination is essentially unremarkable with regular rate and rhythm without murmur rub or thrill. Abdomen is benign with no organomegaly or masses noted. Motor sensory and DTR levels are equal and symmetric in the upper and lower extremities. Cranial nerves II through XII are grossly intact. Proprioception is intact. No peripheral adenopathy or edema is identified. No motor or sensory levels are noted. Crude visual fields are within normal range. ? ?RADIOLOGY RESULTS: No current films for review ? ?PLAN: Present time patient is now out close to 4 years under excellent biochemical control of his prostate cancer.  And pleased with his overall progress.  I am  going to turn over follow-up care to urology.  Be happy to reevaluate the patient anytime should that be indicated.  Patient knows to call with any concerns. ? ?I would like to take this opportunity to thank you for allowing me to participate in the care of your patient.. ?  ? Noreene Filbert, MD ? ?

## 2021-10-21 ENCOUNTER — Telehealth: Payer: PPO

## 2021-10-21 ENCOUNTER — Telehealth: Payer: Self-pay

## 2021-10-21 NOTE — Telephone Encounter (Signed)
  Care Management   Follow Up Note   10/21/2021 Name: Carl Allen MRN: [id number] DOB: July 19, 1945   Referred by: Charlynne Cousins, MD Reason for referral : Chronic Care Management (RNCM: Follow up for Chronic Disease Management and Care Coordination Needs)   An unsuccessful telephone outreach was attempted today. The patient was referred to the case management team for assistance with care management and care coordination.   Follow Up Plan: A HIPPA compliant phone message was left for the patient providing contact information and requesting a return call.    Noreene Larsson RN, MSN, [facility] Mobile: [phone]

## 2021-11-13 ENCOUNTER — Ambulatory Visit: Payer: PPO | Admitting: Internal Medicine

## 2021-11-19 ENCOUNTER — Encounter: Payer: Self-pay | Admitting: Internal Medicine

## 2021-11-19 ENCOUNTER — Ambulatory Visit (INDEPENDENT_AMBULATORY_CARE_PROVIDER_SITE_OTHER): Payer: PPO | Admitting: Internal Medicine

## 2021-11-19 DIAGNOSIS — E7800 Pure hypercholesterolemia, unspecified: Secondary | ICD-10-CM

## 2021-11-19 DIAGNOSIS — I10 Essential (primary) hypertension: Secondary | ICD-10-CM | POA: Diagnosis not present

## 2021-11-19 DIAGNOSIS — J410 Simple chronic bronchitis: Secondary | ICD-10-CM

## 2021-11-19 MED ORDER — ATORVASTATIN CALCIUM 80 MG PO TABS: 80.0000 mg | ORAL_TABLET | Freq: Every day | ORAL | 3 refills | Status: DC

## 2021-11-19 MED ORDER — HYDROCHLOROTHIAZIDE 25 MG PO TABS: 25.0000 mg | ORAL_TABLET | Freq: Every day | ORAL | 1 refills | Status: DC

## 2021-11-19 MED ORDER — SPIRIVA HANDIHALER 18 MCG IN CAPS: 18.0000 ug | ORAL_CAPSULE | Freq: Every morning | RESPIRATORY_TRACT | 1 refills | Status: DC

## 2021-11-19 MED ORDER — TRIAMCINOLONE 0.1 % CREAM:EUCERIN CREAM 1:1: 1.0000 | TOPICAL_CREAM | Freq: Three times a day (TID) | CUTANEOUS | 5 refills | Status: AC | PRN

## 2021-11-19 MED ORDER — ALBUTEROL SULFATE HFA 108 (90 BASE) MCG/ACT IN AERS: 2.0000 | INHALATION_SPRAY | Freq: Four times a day (QID) | RESPIRATORY_TRACT | 0 refills | Status: DC | PRN

## 2021-11-19 MED ORDER — TRIAMCINOLONE 0.1 % CREAM:EUCERIN CREAM 1:1: 1.0000 | TOPICAL_CREAM | Freq: Three times a day (TID) | CUTANEOUS | 5 refills | Status: DC | PRN

## 2021-11-19 MED ORDER — NICOTINE 21 MG/24HR TD PT24: 21.0000 mg | MEDICATED_PATCH | Freq: Every day | TRANSDERMAL | 0 refills | Status: DC

## 2021-11-19 MED ORDER — AMLODIPINE BESYLATE 5 MG PO TABS: 5.0000 mg | ORAL_TABLET | Freq: Every day | ORAL | 1 refills | Status: DC

## 2021-11-19 MED ORDER — FENOFIBRATE MICRONIZED 134 MG PO CAPS: 134.0000 mg | ORAL_CAPSULE | Freq: Every day | ORAL | 1 refills | Status: DC

## 2021-11-19 MED ORDER — BENAZEPRIL HCL 40 MG PO TABS: 40.0000 mg | ORAL_TABLET | Freq: Every day | ORAL | 1 refills | Status: DC

## 2021-11-19 NOTE — Progress Notes (Signed)
BP 138/80   Pulse 64   Temp (!) 97.5 F (36.4 C) (Oral)   Ht 5' 7.01" (1.702 m)   Wt 152 lb 6.4 oz (69.1 kg)   SpO2 99%   BMI 23.86 kg/m    Subjective:    Patient ID: Carl Allen, male    DOB: 1946-04-20, 76 y.o.   MRN: [id number]  Chief Complaint  Patient presents with   Hypertension   Hyperlipidemia   COPD   Emphysema    HPI: Carl Allen is a 76 y.o. male  Pt is here for a fu, has a ho prostate cancer - s/p brachytherapy seeds in 12/2017  Sees Urology for such PSA - 0.4 last visit Aortic aneurysm - stable with 4.8 cm ascending aneurysm.  Hypertension This is a chronic problem. Pertinent negatives include no chest pain, headaches, palpitations or shortness of breath.  Hyperlipidemia Pertinent negatives include no chest pain, myalgias or shortness of breath.  COPD There is no cough, shortness of breath or wheezing. Pertinent negatives include no appetite change, chest pain, ear pain, fever, headaches or myalgias. His past medical history is significant for COPD.    Chief Complaint  Patient presents with   Hypertension   Hyperlipidemia   COPD   Emphysema    Relevant past medical, surgical, family and social history reviewed and updated as indicated. Interim medical history since our last visit reviewed. Allergies and medications reviewed and updated.  Review of Systems  Constitutional:  Negative for activity change, appetite change, chills, fatigue and fever.  HENT:  Negative for congestion, ear discharge, ear pain and facial swelling.   Eyes:  Negative for pain, discharge and itching.  Respiratory:  Negative for cough, chest tightness, shortness of breath and wheezing.   Cardiovascular:  Negative for chest pain, palpitations and leg swelling.  Gastrointestinal:  Negative for abdominal distention, abdominal pain, blood in stool, constipation, diarrhea, nausea and vomiting.  Endocrine: Negative for cold intolerance, heat intolerance, polydipsia, polyphagia  and polyuria.  Genitourinary:  Negative for difficulty urinating, dysuria, flank pain, frequency, hematuria and urgency.  Musculoskeletal:  Negative for arthralgias, gait problem, joint swelling and myalgias.  Skin:  Negative for color change, rash and wound.  Neurological:  Negative for dizziness, tremors, speech difficulty, weakness, light-headedness, numbness and headaches.  Hematological:  Does not bruise/bleed easily.  Psychiatric/Behavioral:  Negative for agitation, confusion, decreased concentration, sleep disturbance and suicidal ideas.     Per HPI unless specifically indicated above     Objective:    BP 138/80   Pulse 64   Temp (!) 97.5 F (36.4 C) (Oral)   Ht 5' 7.01" (1.702 m)   Wt 152 lb 6.4 oz (69.1 kg)   SpO2 99%   BMI 23.86 kg/m   Wt Readings from Last 3 Encounters:  11/19/21 152 lb 6.4 oz (69.1 kg)  09/18/21 151 lb 4.8 oz (68.6 kg)  05/15/21 154 lb 3.2 oz (69.9 kg)    Physical Exam Vitals and nursing note reviewed.  Constitutional:      General: He is not in acute distress.    Appearance: Normal appearance. He is not ill-appearing or diaphoretic.  HENT:     Head: Normocephalic and atraumatic.     Right Ear: Tympanic membrane and external ear normal. There is no impacted cerumen.     Left Ear: External ear normal.     Nose: No congestion or rhinorrhea.     Mouth/Throat:     Pharynx: No oropharyngeal exudate or  posterior oropharyngeal erythema.  Eyes:     Conjunctiva/sclera: Conjunctivae normal.     Pupils: Pupils are equal, round, and reactive to light.  Cardiovascular:     Rate and Rhythm: Normal rate and regular rhythm.     Heart sounds: No murmur heard.    No friction rub. No gallop.  Pulmonary:     Effort: No respiratory distress.     Breath sounds: No stridor. No wheezing or rhonchi.  Chest:     Chest wall: No tenderness.  Abdominal:     General: Abdomen is flat. Bowel sounds are normal.     Palpations: Abdomen is soft. There is no mass.      Tenderness: There is no abdominal tenderness.  Musculoskeletal:     Cervical back: Normal range of motion and neck supple. No rigidity or tenderness.     Left lower leg: No edema.  Skin:    General: Skin is warm and dry.  Neurological:     Mental Status: He is alert.     Results for orders placed or performed in visit on 09/11/21  PSA  Result Value Ref Range   Prostate Specific Ag, Serum 0.4 0.0 - 4.0 ng/mL        Current Outpatient Medications:    albuterol (VENTOLIN HFA) 108 (90 Base) MCG/ACT inhaler, Inhale 2 puffs into the lungs every 6 (six) hours as needed for wheezing or shortness of breath., Disp: 8 g, Rfl: 0   amLODipine (NORVASC) 5 MG tablet, Take 1 tablet (5 mg total) by mouth daily., Disp: 90 tablet, Rfl: 1   atorvastatin (LIPITOR) 80 MG tablet, Take 1 tablet (80 mg total) by mouth daily., Disp: 90 tablet, Rfl: 3   benazepril (LOTENSIN) 40 MG tablet, Take 1 tablet (40 mg total) by mouth daily., Disp: 90 tablet, Rfl: 1   cetirizine (ZYRTEC) 10 MG tablet, Take 10 mg by mouth daily as needed for allergies., Disp: , Rfl:    fenofibrate micronized (LOFIBRA) 134 MG capsule, Take 1 capsule (134 mg total) by mouth daily before breakfast., Disp: 90 capsule, Rfl: 1   ferrous sulfate 325 (65 FE) MG tablet, Take 325 mg by mouth daily with breakfast., Disp: , Rfl:    hydrochlorothiazide (HYDRODIURIL) 25 MG tablet, Take 1 tablet (25 mg total) by mouth daily., Disp: 90 tablet, Rfl: 1   Multiple Vitamins-Minerals (CENTRUM SILVER 50+MEN) TABS, Take 1 tablet by mouth daily., Disp: , Rfl:    nicotine (NICODERM CQ) 21 mg/24hr patch, Place 1 patch (21 mg total) onto the skin daily., Disp: 28 patch, Rfl: 0   tiotropium (SPIRIVA HANDIHALER) 18 MCG inhalation capsule, Place 1 capsule (18 mcg total) into inhaler and inhale every morning., Disp: 90 capsule, Rfl: 1   Triamcinolone Acetonide (TRIAMCINOLONE 0.1 % CREAM : EUCERIN) CREA, Apply 1 Application topically 3 (three) times daily as needed. Use  only on eczematous rash, Disp: 1 each, Rfl: 5    Assessment & Plan:  HTN Is on Norvasc benzapril and hctz for such  Continue current meds.  Medication compliance emphasised. pt advised to keep Bp logs. Pt verbalised understanding of the same. Pt to have a low salt diet . Exercise to reach a goal of at least 150 mins a week.  lifestyle modifications explained and pt understands importance of the above. Under good control on current regimen. Continue current regimen. Continue to monitor. Call with any concerns. Refills given. Labs drawn today.   2. HLD Is on lipitor . recheck FLP, check LFT's  work on diet, SE of meds explained to pt. low fat and high fiber diet explained to pt.  3. Prostrate cancer stable. S/p brachytherapy  4. Aortic aneurysm  Ct angio  Stable ascending aortic aneurysm measuring up to 4.7 cm, previously 4.8 cm. Recommend semi-annual imaging followup by CTA or MRA and referral to cardiothoracic surgery if not already obtained. This recommendation follows 2010  Problem List Items Addressed This Visit       Cardiovascular and Mediastinum   Essential hypertension   Relevant Medications   amLODipine (NORVASC) 5 MG tablet   atorvastatin (LIPITOR) 80 MG tablet   benazepril (LOTENSIN) 40 MG tablet   fenofibrate micronized (LOFIBRA) 134 MG capsule   hydrochlorothiazide (HYDRODIURIL) 25 MG tablet   Other Relevant Orders   Lipid panel   CBC with Differential/Platelet   Comprehensive metabolic panel   Urinalysis, Routine w reflex microscopic     Respiratory   COPD (chronic obstructive pulmonary disease) (HCC)   Relevant Medications   albuterol (VENTOLIN HFA) 108 (90 Base) MCG/ACT inhaler   nicotine (NICODERM CQ) 21 mg/24hr patch   tiotropium (SPIRIVA HANDIHALER) 18 MCG inhalation capsule   Other Relevant Orders   Lipid panel   CBC with Differential/Platelet   Comprehensive metabolic panel   Urinalysis, Routine w reflex microscopic     Other   Hypercholesteremia    Relevant Medications   amLODipine (NORVASC) 5 MG tablet   atorvastatin (LIPITOR) 80 MG tablet   benazepril (LOTENSIN) 40 MG tablet   fenofibrate micronized (LOFIBRA) 134 MG capsule   hydrochlorothiazide (HYDRODIURIL) 25 MG tablet   Other Relevant Orders   Lipid panel   CBC with Differential/Platelet   Comprehensive metabolic panel   Urinalysis, Routine w reflex microscopic     Orders Placed This Encounter  Procedures   Lipid panel   CBC with Differential/Platelet   Comprehensive metabolic panel   Urinalysis, Routine w reflex microscopic     Meds ordered this encounter  Medications   albuterol (VENTOLIN HFA) 108 (90 Base) MCG/ACT inhaler    Sig: Inhale 2 puffs into the lungs every 6 (six) hours as needed for wheezing or shortness of breath.    Dispense:  8 g    Refill:  0   amLODipine (NORVASC) 5 MG tablet    Sig: Take 1 tablet (5 mg total) by mouth daily.    Dispense:  90 tablet    Refill:  1   atorvastatin (LIPITOR) 80 MG tablet    Sig: Take 1 tablet (80 mg total) by mouth daily.    Dispense:  90 tablet    Refill:  3   benazepril (LOTENSIN) 40 MG tablet    Sig: Take 1 tablet (40 mg total) by mouth daily.    Dispense:  90 tablet    Refill:  1   fenofibrate micronized (LOFIBRA) 134 MG capsule    Sig: Take 1 capsule (134 mg total) by mouth daily before breakfast.    Dispense:  90 capsule    Refill:  1   hydrochlorothiazide (HYDRODIURIL) 25 MG tablet    Sig: Take 1 tablet (25 mg total) by mouth daily.    Dispense:  90 tablet    Refill:  1   nicotine (NICODERM CQ) 21 mg/24hr patch    Sig: Place 1 patch (21 mg total) onto the skin daily.    Dispense:  28 patch    Refill:  0   tiotropium (SPIRIVA HANDIHALER) 18 MCG inhalation capsule    Sig: Place  1 capsule (18 mcg total) into inhaler and inhale every morning.    Dispense:  90 capsule    Refill:  1   DISCONTD: Triamcinolone Acetonide (TRIAMCINOLONE 0.1 % CREAM : EUCERIN) CREA    Sig: Apply 1 Application topically  3 (three) times daily as needed. Use only on eczematous rash    Dispense:  1 each    Refill:  5   DISCONTD: Triamcinolone Acetonide (TRIAMCINOLONE 0.1 % CREAM : EUCERIN) CREA    Sig: Apply 1 Application topically 3 (three) times daily as needed. Use only on eczematous rash    Dispense:  1 each    Refill:  5   Triamcinolone Acetonide (TRIAMCINOLONE 0.1 % CREAM : EUCERIN) CREA    Sig: Apply 1 Application topically 3 (three) times daily as needed. Use only on eczematous rash    Dispense:  1 each    Refill:  5     Follow up plan: No follow-ups on file.

## 2021-11-20 ENCOUNTER — Other Ambulatory Visit: Payer: PPO

## 2021-11-20 DIAGNOSIS — I10 Essential (primary) hypertension: Secondary | ICD-10-CM | POA: Diagnosis not present

## 2021-11-20 DIAGNOSIS — J410 Simple chronic bronchitis: Secondary | ICD-10-CM | POA: Diagnosis not present

## 2021-11-20 DIAGNOSIS — E7800 Pure hypercholesterolemia, unspecified: Secondary | ICD-10-CM | POA: Diagnosis not present

## 2021-11-20 LAB — URINALYSIS, ROUTINE W REFLEX MICROSCOPIC
Bilirubin, UA: NEGATIVE
Glucose, UA: NEGATIVE
Ketones, UA: NEGATIVE
Leukocytes,UA: NEGATIVE
Nitrite, UA: NEGATIVE
Protein,UA: NEGATIVE
Specific Gravity, UA: 1.01 (ref 1.005–1.030)
Urobilinogen, Ur: 0.2 mg/dL (ref 0.2–1.0)
pH, UA: 7 (ref 5.0–7.5)

## 2021-11-21 LAB — COMPREHENSIVE METABOLIC PANEL
ALT: 11 IU/L (ref 0–44)
AST: 14 IU/L (ref 0–40)
Albumin/Globulin Ratio: 1.8 (ref 1.2–2.2)
Albumin: 4.5 g/dL (ref 3.7–4.7)
Alkaline Phosphatase: 68 IU/L (ref 44–121)
BUN/Creatinine Ratio: 17 (ref 10–24)
BUN: 14 mg/dL (ref 8–27)
Bilirubin Total: 0.6 mg/dL (ref 0.0–1.2)
CO2: 26 mmol/L (ref 20–29)
Calcium: 9.4 mg/dL (ref 8.6–10.2)
Chloride: 100 mmol/L (ref 96–106)
Creatinine, Ser: 0.82 mg/dL (ref 0.76–1.27)
Globulin, Total: 2.5 g/dL (ref 1.5–4.5)
Glucose: 94 mg/dL (ref 70–99)
Potassium: 4.2 mmol/L (ref 3.5–5.2)
Sodium: 139 mmol/L (ref 134–144)
Total Protein: 7 g/dL (ref 6.0–8.5)
eGFR: 92 mL/min/{1.73_m2} (ref >59)

## 2021-11-21 LAB — LIPID PANEL
Chol/HDL Ratio: 3.7 ratio (ref 0.0–5.0)
Cholesterol, Total: 115 mg/dL (ref 100–199)
HDL: 31 mg/dL — ABNORMAL LOW (ref >39)
LDL Chol Calc (NIH): 42 mg/dL (ref 0–99)
Triglycerides: 272 mg/dL — ABNORMAL HIGH (ref 0–149)
VLDL Cholesterol Cal: 42 mg/dL — ABNORMAL HIGH (ref 5–40)

## 2021-11-21 LAB — CBC WITH DIFFERENTIAL/PLATELET
Basophils Absolute: 0.1 10*3/uL (ref 0.0–0.2)
Basos: 1 %
EOS (ABSOLUTE): 0.2 10*3/uL (ref 0.0–0.4)
Eos: 2 %
Hematocrit: 42.3 % (ref 37.5–51.0)
Hemoglobin: 13.9 g/dL (ref 13.0–17.7)
Immature Grans (Abs): 0 10*3/uL (ref 0.0–0.1)
Immature Granulocytes: 0 %
Lymphocytes Absolute: 2 10*3/uL (ref 0.7–3.1)
Lymphs: 29 %
MCH: 31.6 pg (ref 26.6–33.0)
MCHC: 32.9 g/dL (ref 31.5–35.7)
MCV: 96 fL (ref 79–97)
Monocytes Absolute: 0.6 10*3/uL (ref 0.1–0.9)
Monocytes: 8 %
Neutrophils Absolute: 4.1 10*3/uL (ref 1.4–7.0)
Neutrophils: 60 %
Platelets: 187 10*3/uL (ref 150–450)
RBC: 4.4 x10E6/uL (ref 4.14–5.80)
RDW: 13.6 % (ref 11.6–15.4)
WBC: 6.9 10*3/uL (ref 3.4–10.8)

## 2021-11-25 DIAGNOSIS — Z041 Encounter for examination and observation following transport accident: Secondary | ICD-10-CM | POA: Diagnosis not present

## 2021-11-25 DIAGNOSIS — J449 Chronic obstructive pulmonary disease, unspecified: Secondary | ICD-10-CM | POA: Diagnosis not present

## 2021-11-25 DIAGNOSIS — I4891 Unspecified atrial fibrillation: Secondary | ICD-10-CM | POA: Diagnosis not present

## 2021-11-25 DIAGNOSIS — S0990XA Unspecified injury of head, initial encounter: Secondary | ICD-10-CM | POA: Diagnosis not present

## 2021-11-25 DIAGNOSIS — S0101XA Laceration without foreign body of scalp, initial encounter: Secondary | ICD-10-CM | POA: Diagnosis not present

## 2021-11-25 DIAGNOSIS — S069X9A Unspecified intracranial injury with loss of consciousness of unspecified duration, initial encounter: Secondary | ICD-10-CM | POA: Diagnosis not present

## 2021-11-25 DIAGNOSIS — S299XXA Unspecified injury of thorax, initial encounter: Secondary | ICD-10-CM | POA: Diagnosis not present

## 2021-11-25 DIAGNOSIS — Z8546 Personal history of malignant neoplasm of prostate: Secondary | ICD-10-CM | POA: Diagnosis not present

## 2021-11-25 DIAGNOSIS — I719 Aortic aneurysm of unspecified site, without rupture: Secondary | ICD-10-CM | POA: Diagnosis not present

## 2021-11-25 DIAGNOSIS — S3993XA Unspecified injury of pelvis, initial encounter: Secondary | ICD-10-CM | POA: Diagnosis not present

## 2021-11-25 DIAGNOSIS — F17200 Nicotine dependence, unspecified, uncomplicated: Secondary | ICD-10-CM | POA: Diagnosis not present

## 2021-11-25 DIAGNOSIS — I10 Essential (primary) hypertension: Secondary | ICD-10-CM | POA: Diagnosis not present

## 2021-11-25 DIAGNOSIS — J329 Chronic sinusitis, unspecified: Secondary | ICD-10-CM | POA: Diagnosis not present

## 2021-11-25 DIAGNOSIS — E785 Hyperlipidemia, unspecified: Secondary | ICD-10-CM | POA: Diagnosis not present

## 2021-11-28 ENCOUNTER — Telehealth: Payer: PPO

## 2021-11-28 ENCOUNTER — Ambulatory Visit (INDEPENDENT_AMBULATORY_CARE_PROVIDER_SITE_OTHER): Payer: PPO

## 2021-11-28 DIAGNOSIS — E7800 Pure hypercholesterolemia, unspecified: Secondary | ICD-10-CM

## 2021-11-28 DIAGNOSIS — I10 Essential (primary) hypertension: Secondary | ICD-10-CM | POA: Diagnosis not present

## 2021-11-28 DIAGNOSIS — E785 Hyperlipidemia, unspecified: Secondary | ICD-10-CM

## 2021-11-28 DIAGNOSIS — J410 Simple chronic bronchitis: Secondary | ICD-10-CM

## 2021-11-28 DIAGNOSIS — F17210 Nicotine dependence, cigarettes, uncomplicated: Secondary | ICD-10-CM | POA: Diagnosis not present

## 2021-11-28 DIAGNOSIS — I712 Thoracic aortic aneurysm, without rupture, unspecified: Secondary | ICD-10-CM

## 2021-11-28 DIAGNOSIS — J449 Chronic obstructive pulmonary disease, unspecified: Secondary | ICD-10-CM | POA: Diagnosis not present

## 2021-11-28 DIAGNOSIS — F419 Anxiety disorder, unspecified: Secondary | ICD-10-CM
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
## 2021-12-01 ENCOUNTER — Encounter: Payer: Self-pay | Admitting: Physician Assistant

## 2021-12-01 ENCOUNTER — Ambulatory Visit (INDEPENDENT_AMBULATORY_CARE_PROVIDER_SITE_OTHER): Payer: PPO | Admitting: Physician Assistant

## 2021-12-01 VITALS — BP 123/74 | HR 65 | Temp 98.9°F | Wt 154.2 lb

## 2021-12-01 DIAGNOSIS — S0990XD Unspecified injury of head, subsequent encounter: Secondary | ICD-10-CM | POA: Diagnosis not present

## 2021-12-01 DIAGNOSIS — M549 Dorsalgia, unspecified: Secondary | ICD-10-CM | POA: Diagnosis not present

## 2021-12-01 DIAGNOSIS — S0191XD Laceration without foreign body of unspecified part of head, subsequent encounter: Secondary | ICD-10-CM | POA: Diagnosis not present
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
## 2021-12-01 NOTE — Patient Instructions (Addendum)
Based on the chart from [facility] I recommend that you reach out to this office for follow up for evaluation and  for staple removal  [facility] [name],  [place] [address] T: [phone] F: [phone]    Based on your symptoms and physical exam I believe the following is the cause of your concern today Back pain likely secondary to a strain of your back muscles  I recommend the following at this time to help relieve that discomfort:  Rest Warm compresses to the area (20 minutes on, minimum of 30 minutes off) or warm showers You can alternate Tylenol and Ibuprofen for pain management but Ibuprofen is typically preferred to reduce inflammation.  Gentle stretches and exercises that I have included in your paperwork Try to reduce excess strain to the area and rest as much as possible  Wear supportive shoes and, if you must lift anything, use proper lifting techniques that spare your back.   If these measures do not lead to improvement in your symptoms over the next 2-4 weeks please let us know

## 2021-12-05 DIAGNOSIS — S0100XD Unspecified open wound of scalp, subsequent encounter: Secondary | ICD-10-CM | POA: Diagnosis not present

## 2021-12-09 DIAGNOSIS — F17210 Nicotine dependence, cigarettes, uncomplicated: Secondary | ICD-10-CM | POA: Diagnosis not present

## 2021-12-09 DIAGNOSIS — T86821 Skin graft (allograft) (autograft) failure: Secondary | ICD-10-CM | POA: Diagnosis not present

## 2021-12-09 DIAGNOSIS — J449 Chronic obstructive pulmonary disease, unspecified: Secondary | ICD-10-CM | POA: Diagnosis not present

## 2021-12-09 DIAGNOSIS — S0100XD Unspecified open wound of scalp, subsequent encounter: Secondary | ICD-10-CM | POA: Diagnosis not present

## 2021-12-09 DIAGNOSIS — I96 Gangrene, not elsewhere classified: Secondary | ICD-10-CM | POA: Diagnosis not present

## 2022-01-06 DIAGNOSIS — F1020 Alcohol dependence, uncomplicated: Secondary | ICD-10-CM | POA: Diagnosis not present

## 2022-01-06 DIAGNOSIS — I10 Essential (primary) hypertension: Secondary | ICD-10-CM | POA: Diagnosis not present

## 2022-01-06 DIAGNOSIS — J42 Unspecified chronic bronchitis: Secondary | ICD-10-CM | POA: Diagnosis not present

## 2022-01-06 DIAGNOSIS — E785 Hyperlipidemia, unspecified: Secondary | ICD-10-CM | POA: Diagnosis not present

## 2022-01-06 DIAGNOSIS — I480 Paroxysmal atrial fibrillation: Secondary | ICD-10-CM | POA: Diagnosis not present

## 2022-01-06 DIAGNOSIS — R55 Syncope and collapse: Secondary | ICD-10-CM | POA: Diagnosis not present

## 2022-01-26 ENCOUNTER — Ambulatory Visit (INDEPENDENT_AMBULATORY_CARE_PROVIDER_SITE_OTHER): Payer: PPO | Admitting: *Deleted

## 2022-01-26 DIAGNOSIS — Z0000 Encounter for general adult medical examination without abnormal findings: Secondary | ICD-10-CM | POA: Diagnosis not present

## 2022-01-26 NOTE — Progress Notes (Signed)
Subjective:   Carl Allen is a 76 y.o. male who presents for Medicare Annual/Subsequent preventive examination. I connected with  [name] on 01/26/22 by a telephone enabled telemedicine application and verified that I am speaking with the correct person using two identifiers.   I discussed the limitations of evaluation and management by telemedicine. The patient expressed understanding and agreed to proceed.  Patient location: home  Provider location: Tele-Health-home    Review of Systems     Cardiac Risk Factors include: advanced age (>36mn, >>53women);male gender;smoking/ tobacco exposure;sedentary lifestyle;family history of premature cardiovascular disease;hypertension     Objective:    Today's Vitals   There is no height or weight on file to calculate BMI.     01/26/2022    9:40 AM 09/18/2021   10:47 AM 01/24/2021    9:51 AM 08/31/2019   11:23 AM 02/16/2019   10:51 AM 02/01/2019    9:48 AM 07/18/2018   11:25 AM  Advanced Directives  Does Patient Have a Medical Advance Directive? No No No No No No No  Would patient like information on creating a medical advance directive? No - Patient declined No - Patient declined  No - Patient declined No - Patient declined  No - Patient declined    Current Medications (verified) Outpatient Encounter Medications as of 01/26/2022  Medication Sig   albuterol (VENTOLIN HFA) 108 (90 Base) MCG/ACT inhaler Inhale 2 puffs into the lungs every 6 (six) hours as needed for wheezing or shortness of breath.   amLODipine (NORVASC) 5 MG tablet Take 1 tablet (5 mg total) by mouth daily.   atorvastatin (LIPITOR) 80 MG tablet Take 1 tablet (80 mg total) by mouth daily.   benazepril (LOTENSIN) 40 MG tablet Take 1 tablet (40 mg total) by mouth daily.   cetirizine (ZYRTEC) 10 MG tablet Take 10 mg by mouth daily as needed for allergies.   fenofibrate micronized (LOFIBRA) 134 MG capsule Take 1 capsule (134 mg total) by mouth daily before  breakfast.   ferrous sulfate 325 (65 FE) MG tablet Take 325 mg by mouth daily with breakfast.   hydrochlorothiazide (HYDRODIURIL) 25 MG tablet Take 1 tablet (25 mg total) by mouth daily.   Multiple Vitamins-Minerals (CENTRUM SILVER 50+MEN) TABS Take 1 tablet by mouth daily.   tiotropium (SPIRIVA HANDIHALER) 18 MCG inhalation capsule Place 1 capsule (18 mcg total) into inhaler and inhale every morning.   Triamcinolone Acetonide (TRIAMCINOLONE 0.1 % CREAM : EUCERIN) CREA Apply 1 Application topically 3 (three) times daily as needed. Use only on eczematous rash   nicotine (NICODERM CQ) 21 mg/24hr patch Place 1 patch (21 mg total) onto the skin daily. (Patient not taking: Reported on 01/26/2022)   No facility-administered encounter medications on file as of 01/26/2022.    Allergies (verified) Patient has no known allergies.   History: Past Medical History:  Diagnosis Date   Allergy    Anxiety    not often   Arthritis    fingers, knees   Cancer ([facility]    Cataract    Chronic alcoholism ([facility]    COPD (chronic obstructive pulmonary disease) ([facility]    Depression    pt states sometimes   Dyspnea    Emphysema of lung ([facility])    GERD (gastroesophageal reflux disease)    pt states every now and then   Hypertension    Hypertriglyceridemia    Past Surgical History:  Procedure Laterality Date   CATARACT EXTRACTION, BILATERAL  2014  COLONOSCOPY  2012   CYSTOSCOPY N/A 01/10/2018   Procedure: CYSTOSCOPY;  Surgeon: Hollice Espy, MD;  Location: [facility] ORS;  Service: Urology;  Laterality: N/A;   RADIOACTIVE SEED IMPLANT N/A 01/10/2018   Procedure: RADIOACTIVE SEED IMPLANT/BRACHYTHERAPY IMPLANT;  Surgeon: Hollice Espy, MD;  Location: [facility] ORS;  Service: Urology;  Laterality: N/A;   TONSILLECTOMY     TONSILLECTOMY AND ADENOIDECTOMY  1952   Family History  Problem Relation Age of Onset   Arthritis Mother    Heart disease Mother    Hearing loss Mother    Hyperlipidemia Mother    Hypertension  Mother    Kidney disease Mother    Miscarriages / [place] Mother    Vision loss Mother    Stroke Father    Heart disease Maternal Grandmother    Stroke Maternal Grandfather    Cancer Paternal Grandfather    Social History   Socioeconomic History   Marital status: Single    Spouse name: Not on file   Number of children: Not on file   Years of education: college   Highest education level: Bachelor's degree (e.g., BA, AB, BS)  Occupational History    Comment: Retired  Tobacco Use   Smoking status: Every Day    Packs/day: 0.50    Years: 57.00    Total pack years: 28.50    Types: Cigarettes   Smokeless tobacco: Never  Vaping Use   Vaping Use: Never used  Substance and Sexual Activity   Alcohol use: Yes    Alcohol/week: 3.0 standard drinks of alcohol    Types: 3 Shots of liquor per week    Comment: pt is currently trying to quit   Drug use: No   Sexual activity: Not Currently  Other Topics Concern   Not on file  Social History Narrative   Not on file   Social Determinants of Health   Financial Resource Strain: Low Risk  (01/26/2022)   Overall Financial Resource Strain (CARDIA)    Difficulty of Paying Living Expenses: Not hard at all  Food Insecurity: No Food Insecurity (01/26/2022)   Hunger Vital Sign    Worried About Running Out of Food in the Last Year: Never true    Ran Out of Food in the Last Year: Never true  Transportation Needs: No Transportation Needs (01/26/2022)   PRAPARE - Hydrologist (Medical): No    Lack of Transportation (Non-Medical): No  Physical Activity: Inactive (01/26/2022)   Exercise Vital Sign    Days of Exercise per Week: 0 days    Minutes of Exercise per Session: 0 min  Stress: No Stress Concern Present (01/26/2022)   [place]    Feeling of Stress : Not at all  Social Connections: Unknown (01/26/2022)   Social Connection and Isolation Panel  [NHANES]    Frequency of Communication with Friends and Family: Never    Frequency of Social Gatherings with Friends and Family: Never    Attends Religious Services: Never    Marine scientist or Organizations: No    Attends Music therapist: Never    Marital Status: Not on file    Tobacco Counseling Ready to quit: Not Answered Counseling given: Not Answered   Clinical Intake:  Pre-visit preparation completed: Yes  Pain : No/denies pain     Diabetes: No  How often do you need to have someone help you when you read instructions, pamphlets, or other written  materials from your doctor or pharmacy?: 1 - Never  Diabetic?  no  Interpreter Needed?: No  Information entered by :: Leroy Kennedy LPN   Activities of Daily Living    01/26/2022    9:53 AM 11/19/2021   10:28 AM  In your present state of health, do you have any difficulty performing the following activities:  Hearing? 0 0  Vision? 0 0  Difficulty concentrating or making decisions? 0 0  Walking or climbing stairs? 1 0  Dressing or bathing? 0 0  Doing errands, shopping? 0 0  Preparing Food and eating ? N   Using the Toilet? N   In the past six months, have you accidently leaked urine? N   Do you have problems with loss of bowel control? N   Managing your Medications? N   Managing your Finances? N   Housekeeping or managing your Housekeeping? N     Patient Care Team: Charlynne Cousins, MD as PCP - General Erby Pian, MD as Referring Physician (Specialist) Teodoro Spray, MD as Consulting Physician (Cardiology) Noreene Filbert, MD as Referring Physician (Radiation Oncology) Hollice Espy, MD as Consulting Physician (Urology) Vanita Ingles, RN as Case Manager (General Practice) Noreene Filbert, MD as Radiation Oncologist (Radiation Oncology)  Indicate any recent Medical Services you may have received from other than [facility] providers in the past year (date may be approximate).      Assessment:   This is a routine wellness examination for Dominie.  Hearing/Vision screen Hearing Screening - Comments:: No trouble hearing Vision Screening - Comments:: Not up to date [place] Had cataract surgery about 5 years  Dietary issues and exercise activities discussed: Current Exercise Habits: The patient does not participate in regular exercise at present   Goals Addressed             This Visit's Progress    Quit Smoking         Depression Screen    01/26/2022    9:52 AM 11/19/2021   10:28 AM 05/15/2021    9:28 AM 02/10/2021   11:09 AM 01/24/2021    9:55 AM 12/04/2020   11:01 AM 11/04/2020    9:15 AM  PHQ 2/9 Scores  PHQ - 2 Score 0 0 0 0 0 0 0  PHQ- 9 Score  1 1 0   0    Fall Risk    01/26/2022    9:41 AM 11/19/2021   10:27 AM 02/10/2021   11:09 AM 01/24/2021    9:54 AM 12/04/2020   11:01 AM  Fall Risk   Falls in the past year? 0 0 0 0 0  Number falls in past yr: 0 0 0  0  Injury with Fall? 0 0 0  0  Risk for fall due to :  No Fall Risks  Medication side effect No Fall Risks  Follow up Falls evaluation completed;Education provided;Falls prevention discussed Falls evaluation completed Falls evaluation completed Falls evaluation completed;Education provided;Falls prevention discussed Falls evaluation completed    FALL RISK PREVENTION PERTAINING TO THE HOME:  Any stairs in or around the home? Yes  If so, are there any without handrails? No  Home free of loose throw rugs in walkways, pet beds, electrical cords, etc? Yes  Adequate lighting in your home to reduce risk of falls? Yes   ASSISTIVE DEVICES UTILIZED TO PREVENT FALLS:  Life alert? No  Use of a cane, walker or w/c? No  Grab bars in the bathroom? Yes  Shower chair or bench in shower? Yes  Elevated toilet seat or a handicapped toilet? No   TIMED UP AND GO:  Was the test performed? No .    Cognitive Function:        01/26/2022    9:44 AM 01/24/2021    9:56 AM 01/26/2018    9:14 AM  01/06/2017    9:58 AM  6CIT Screen  What Year? 0 points 0 points 0 points 0 points  What month? 0 points 0 points 0 points 0 points  What time? 0 points 0 points 0 points 0 points  Count back from 20 0 points 0 points 0 points 0 points  Months in reverse 0 points 0 points 0 points 0 points  Repeat phrase 0 points 0 points 0 points 0 points  Total Score 0 points 0 points 0 points 0 points    Immunizations Immunization History  Administered Date(s) Administered   Fluad Quad(high Dose 65+) 03/07/2019, 03/12/2020, 02/10/2021   Influenza, High Dose Seasonal PF 04/07/2017, 02/02/2018   Influenza-Unspecified 02/09/2015, 02/24/2016   PFIZER(Purple Top)SARS-COV-2 Vaccination 07/12/2019, 08/02/2019, 02/29/2020, 10/04/2020, 02/16/2021   Pneumococcal Conjugate-13 12/21/2013   Pneumococcal Polysaccharide-23 03/22/2008, 02/13/2013   Td 09/20/2001   Tdap 01/08/2016, 11/25/2021   Zoster Recombinat (Shingrix) 03/18/2021   Zoster, Live 12/18/2010    TDAP status: Up to date  Flu Vaccine status: Up to date  Pneumococcal vaccine status: Up to date  Covid-19 vaccine status: Completed vaccines  Qualifies for Shingles Vaccine? Yes   Zostavax completed Yes   Shingrix Completed?: Yes  Screening Tests Health Maintenance  Topic Date Due   COVID-19 Vaccine (6 - Pfizer risk series) 04/13/2021   Zoster Vaccines- Shingrix (2 of 2) 05/13/2021   INFLUENZA VACCINE  12/30/2021   TETANUS/TDAP  11/26/2031   Pneumonia Vaccine 75+ Years old  Completed   Hepatitis C Screening  Completed   HPV VACCINES  Aged Out   COLONOSCOPY (Pts 45-4yr Insurance coverage will need to be confirmed)  [place] Bridge[place] Due  Topic Date Due   COVID-19 Vaccine (6 - Pfizer risk series) 04/13/2021   Zoster Vaccines- Shingrix (2 of 2) 05/13/2021   INFLUENZA VACCINE  12/30/2021    Colorectal cancer screening: No longer required.   Lung Cancer Screening: (Low Dose CT Chest recommended  if Age 31109-80years, 30 pack-year currently smoking OR have quit w/in 15years.) does qualify.   Lung Cancer Screening Referral: will discuss with Cardiologist.  He sees them every 6 months.  Additional Screening:  Hepatitis C Screening: does not qualify; Completed 2017  Vision Screening: Recommended annual ophthalmology exams for early detection of glaucoma and other disorders of the eye. Is the patient up to date with their annual eye exam?  No  Who is the provider or what is the name of the office in which the patient attends annual eye exams? [facility] If pt is not established with a provider, would they like to be referred to a provider to establish care? No .   Dental Screening: Recommended annual dental exams for proper oral hygiene  Community Resource Referral / Chronic Care Management: CRR required this visit?  No   CCM required this visit?  No      Plan:     I have personally reviewed and noted the following in the patient's chart:   Medical and social history Use of alcohol, tobacco or illicit drugs  Current medications and supplements including opioid prescriptions. Patient is  not currently taking opioid prescriptions. Functional ability and status Nutritional status Physical activity Advanced directives List of other physicians Hospitalizations, surgeries, and ER visits in previous 12 months Vitals Screenings to include cognitive, depression, and falls Referrals and appointments  In addition, I have reviewed and discussed with patient certain preventive protocols, quality metrics, and best practice recommendations. A written personalized care plan for preventive services as well as general preventive health recommendations were provided to patient.     Leroy Kennedy, LPN   10/04/3974   Nurse Notes:

## 2022-01-26 NOTE — Patient Instructions (Signed)
Mr. Carl Allen , Thank you for taking time to come for your Medicare Wellness Visit. I appreciate your ongoing commitment to your health goals. Please review the following plan we discussed and let me know if I can assist you in the future.   Screening recommendations/referrals: Colonoscopy: no longer required Recommended yearly ophthalmology/optometry visit for glaucoma screening and checkup Recommended yearly dental visit for hygiene and checkup  Vaccinations: Influenza vaccine: Education provided Pneumococcal vaccine: up to date Tdap vaccine: up tod ate Shingles vaccine: 1 of 2      Advanced directives: Education provided  Conditions/risks identified:   Next appointment: case manger 01-28-2022 @ 11:40    Preventive Care 81 Years and Older, Male Preventive care refers to lifestyle choices and visits with your health care provider that can promote health and wellness. What does preventive care include? A yearly physical exam. This is also called an annual well check. Dental exams once or twice a year. Routine eye exams. Ask your health care provider how often you should have your eyes checked. Personal lifestyle choices, including: Daily care of your teeth and gums. Regular physical activity. Eating a healthy diet. Avoiding tobacco and drug use. Limiting alcohol use. Practicing safe sex. Taking low doses of aspirin every day. Taking vitamin and mineral supplements as recommended by your health care provider. What happens during an annual well check? The services and screenings done by your health care provider during your annual well check will depend on your age, overall health, lifestyle risk factors, and family history of disease. Counseling  Your health care provider may ask you questions about your: Alcohol use. Tobacco use. Drug use. Emotional well-being. Home and relationship well-being. Sexual activity. Eating habits. History of falls. Memory and ability to  understand (cognition). Work and work Statistician. Screening  You may have the following tests or measurements: Height, weight, and BMI. Blood pressure. Lipid and cholesterol levels. These may be checked every 5 years, or more frequently if you are over 95 years old. Skin check. Lung cancer screening. You may have this screening every year starting at age 52 if you have a 30-pack-year history of smoking and currently smoke or have quit within the past 15 years. Fecal occult blood test (FOBT) of the stool. You may have this test every year starting at age 41. Flexible sigmoidoscopy or colonoscopy. You may have a sigmoidoscopy every 5 years or a colonoscopy every 10 years starting at age 43. Prostate cancer screening. Recommendations will vary depending on your family history and other risks. Hepatitis C blood test. Hepatitis B blood test. Sexually transmitted disease (STD) testing. Diabetes screening. This is done by checking your blood sugar (glucose) after you have not eaten for a while (fasting). You may have this done every 1-3 years. Abdominal aortic aneurysm (AAA) screening. You may need this if you are a current or former smoker. Osteoporosis. You may be screened starting at age 2 if you are at high risk. Talk with your health care provider about your test results, treatment options, and if necessary, the need for more tests. Vaccines  Your health care provider may recommend certain vaccines, such as: Influenza vaccine. This is recommended every year. Tetanus, diphtheria, and acellular pertussis (Tdap, Td) vaccine. You may need a Td booster every 10 years. Zoster vaccine. You may need this after age 18. Pneumococcal 13-valent conjugate (PCV13) vaccine. One dose is recommended after age 29. Pneumococcal polysaccharide (PPSV23) vaccine. One dose is recommended after age 73. Talk to your health care provider  about which screenings and vaccines you need and how often you need them. This  information is not intended to replace advice given to you by your health care provider. Make sure you discuss any questions you have with your health care provider. Document Released: 06/14/2015 Document Revised: 02/05/2016 Document Reviewed: 03/19/2015 Elsevier Interactive Patient Education  2017 [place] Prevention in the Home Falls can cause injuries. They can happen to people of all ages. There are many things you can do to make your home safe and to help prevent falls. What can I do on the outside of my home? Regularly fix the edges of walkways and driveways and fix any cracks. Remove anything that might make you trip as you walk through a door, such as a raised step or threshold. Trim any bushes or trees on the path to your home. Use bright outdoor lighting. Clear any walking paths of anything that might make someone trip, such as rocks or tools. Regularly check to see if handrails are loose or broken. Make sure that both sides of any steps have handrails. Any raised decks and porches should have guardrails on the edges. Have any leaves, snow, or ice cleared regularly. Use sand or salt on walking paths during winter. Clean up any spills in your garage right away. This includes oil or grease spills. What can I do in the bathroom? Use night lights. Install grab bars by the toilet and in the tub and shower. Do not use towel bars as grab bars. Use non-skid mats or decals in the tub or shower. If you need to sit down in the shower, use a plastic, non-slip stool. Keep the floor dry. Clean up any water that spills on the floor as soon as it happens. Remove soap buildup in the tub or shower regularly. Attach bath mats securely with double-sided non-slip rug tape. Do not have throw rugs and other things on the floor that can make you trip. What can I do in the bedroom? Use night lights. Make sure that you have a light by your bed that is easy to reach. Do not use any sheets or  blankets that are too big for your bed. They should not hang down onto the floor. Have a firm chair that has side arms. You can use this for support while you get dressed. Do not have throw rugs and other things on the floor that can make you trip. What can I do in the kitchen? Clean up any spills right away. Avoid walking on wet floors. Keep items that you use a lot in easy-to-reach places. If you need to reach something above you, use a strong step stool that has a grab bar. Keep electrical cords out of the way. Do not use floor polish or wax that makes floors slippery. If you must use wax, use non-skid floor wax. Do not have throw rugs and other things on the floor that can make you trip. What can I do with my stairs? Do not leave any items on the stairs. Make sure that there are handrails on both sides of the stairs and use them. Fix handrails that are broken or loose. Make sure that handrails are as long as the stairways. Check any carpeting to make sure that it is firmly attached to the stairs. Fix any carpet that is loose or worn. Avoid having throw rugs at the top or bottom of the stairs. If you do have throw rugs, attach them to the floor  with carpet tape. Make sure that you have a light switch at the top of the stairs and the bottom of the stairs. If you do not have them, ask someone to add them for you. What else can I do to help prevent falls? Wear shoes that: Do not have high heels. Have rubber bottoms. Are comfortable and fit you well. Are closed at the toe. Do not wear sandals. If you use a stepladder: Make sure that it is fully opened. Do not climb a closed stepladder. Make sure that both sides of the stepladder are locked into place. Ask someone to hold it for you, if possible. Clearly mark and make sure that you can see: Any grab bars or handrails. First and last steps. Where the edge of each step is. Use tools that help you move around (mobility aids) if they are  needed. These include: Canes. Walkers. Scooters. Crutches. Turn on the lights when you go into a dark area. Replace any light bulbs as soon as they burn out. Set up your furniture so you have a clear path. Avoid moving your furniture around. If any of your floors are uneven, fix them. If there are any pets around you, be aware of where they are. Review your medicines with your doctor. Some medicines can make you feel dizzy. This can increase your chance of falling. Ask your doctor what other things that you can do to help prevent falls. This information is not intended to replace advice given to you by your health care provider. Make sure you discuss any questions you have with your health care provider. Document Released: 03/14/2009 Document Revised: 10/24/2015 Document Reviewed: 06/22/2014 Elsevier Interactive Patient Education  2017 [employer].

## 2022-01-28 ENCOUNTER — Telehealth: Payer: PPO

## 2022-01-28 ENCOUNTER — Ambulatory Visit: Payer: Self-pay
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
## 2022-01-29 ENCOUNTER — Telehealth: Payer: Self-pay

## 2022-01-29 ENCOUNTER — Telehealth: Payer: Self-pay | Admitting: Acute Care

## 2022-01-29 NOTE — Telephone Encounter (Signed)
Attempted to reach pt to schedule annual LDCT-LVMM

## 2022-01-29 NOTE — Telephone Encounter (Signed)
Wrong chart

## 2022-02-04 DIAGNOSIS — Z48817 Encounter for surgical aftercare following surgery on the skin and subcutaneous tissue: Secondary | ICD-10-CM | POA: Diagnosis not present

## 2022-02-04 DIAGNOSIS — S0100XS Unspecified open wound of scalp, sequela: Secondary | ICD-10-CM | POA: Diagnosis not present

## 2022-02-25 ENCOUNTER — Other Ambulatory Visit: Payer: Self-pay

## 2022-02-25 DIAGNOSIS — C61 Malignant neoplasm of prostate: Secondary | ICD-10-CM

## 2022-03-03 ENCOUNTER — Other Ambulatory Visit: Payer: PPO

## 2022-03-03 DIAGNOSIS — C61 Malignant neoplasm of prostate: Secondary | ICD-10-CM

## 2022-03-04 LAB — PSA: Prostate Specific Ag, Serum: 0.4 ng/mL (ref 0.0–4.0)
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
## 2022-03-05 ENCOUNTER — Encounter: Payer: Self-pay | Admitting: Urology

## 2022-03-05 ENCOUNTER — Ambulatory Visit (INDEPENDENT_AMBULATORY_CARE_PROVIDER_SITE_OTHER): Payer: PPO | Admitting: Urology

## 2022-03-05 VITALS — BP 143/80 | HR 68 | Ht 67.0 in | Wt 158.0 lb

## 2022-03-05 DIAGNOSIS — N138 Other obstructive and reflux uropathy: Secondary | ICD-10-CM | POA: Diagnosis not present

## 2022-03-05 DIAGNOSIS — N401 Enlarged prostate with lower urinary tract symptoms: Secondary | ICD-10-CM | POA: Diagnosis not present

## 2022-03-05 DIAGNOSIS — C61 Malignant neoplasm of prostate: Secondary | ICD-10-CM

## 2022-04-29 ENCOUNTER — Ambulatory Visit: Payer: Self-pay | Admitting: *Deleted
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
## 2022-06-18 ENCOUNTER — Other Ambulatory Visit: Payer: Self-pay | Admitting: Thoracic Surgery (Cardiothoracic Vascular Surgery)

## 2022-06-18 DIAGNOSIS — Q231 Congenital insufficiency of aortic valve: Secondary | ICD-10-CM

## 2022-06-18 DIAGNOSIS — I7121 Aneurysm of the ascending aorta, without rupture: Secondary | ICD-10-CM

## 2022-06-22 ENCOUNTER — Ambulatory Visit: Payer: Self-pay | Admitting: *Deleted

## 2022-06-22 NOTE — Patient Outreach (Signed)
  Care Coordination   06/22/2022 Name: Carl Allen MRN: [id number] DOB: 1945/07/15   Care Coordination Outreach Attempts:  An unsuccessful telephone outreach was attempted for a scheduled appointment today.  Follow Up Plan:  Additional outreach attempts will be made to offer the patient care coordination information and services.   Encounter Outcome:  No Answer   Care Coordination Interventions:  No, not indicated    Valente David, RN, MSN, [facility] Care Management Care Management Coordinator [phone]

## 2022-06-22 NOTE — Patient Outreach (Signed)
  Care Coordination   Follow Up Visit Note   06/22/2022 Name: Carl Allen MRN: [id number] DOB: November 04, 1945  Carl Allen is a 77 y.o. year old male who sees [name], [name], MD (Inactive) for primary care. I spoke with  Carl Allen by phone today.  What matters to the patients health and wellness today?  Eager to hear how his aneurysm is doing.     Goals Addressed             This Visit's Progress    Management of aortic aneurysm   On track    Care Coordination Interventions: Evaluation of current treatment plan related to aortic aneurysm and patient's adherence to plan as established by provider Advised patient to call this RNCM if transportation to appointments become an issue Reviewed medications with patient and discussed adherence and affordability Reviewed scheduled/upcoming provider appointments including PCP on 1/25, cardiology on 2/6, scan and cardiothoracic surgeon appointment on 2/23.  Discussed scheduling appointment to receive flu and covid vaccinations - has office visit on 1/25 for vaccinations         SDOH assessments and interventions completed:  No     Care Coordination Interventions:  Yes, provided   Follow up plan: Follow up call scheduled for 2/28    Encounter Outcome:  Pt. Visit Completed   Valente David, RN, MSN, [place] Care Management Care Management Coordinator [phone]

## 2022-06-25 ENCOUNTER — Encounter: Payer: Self-pay | Admitting: Nurse Practitioner

## 2022-06-25 ENCOUNTER — Ambulatory Visit (INDEPENDENT_AMBULATORY_CARE_PROVIDER_SITE_OTHER): Payer: PPO | Admitting: Nurse Practitioner

## 2022-06-25 VITALS — BP 136/75 | HR 76 | Temp 98.0°F | Wt 160.6 lb

## 2022-06-25 DIAGNOSIS — J410 Simple chronic bronchitis: Secondary | ICD-10-CM

## 2022-06-25 DIAGNOSIS — Z23 Encounter for immunization: Secondary | ICD-10-CM

## 2022-06-25 DIAGNOSIS — E7800 Pure hypercholesterolemia, unspecified: Secondary | ICD-10-CM | POA: Diagnosis not present

## 2022-06-25 DIAGNOSIS — C61 Malignant neoplasm of prostate: Secondary | ICD-10-CM | POA: Diagnosis not present

## 2022-06-25 DIAGNOSIS — I719 Aortic aneurysm of unspecified site, without rupture: Secondary | ICD-10-CM | POA: Diagnosis not present

## 2022-06-25 DIAGNOSIS — E782 Mixed hyperlipidemia: Secondary | ICD-10-CM | POA: Diagnosis not present

## 2022-06-25 DIAGNOSIS — I10 Essential (primary) hypertension: Secondary | ICD-10-CM | POA: Diagnosis not present

## 2022-06-25 DIAGNOSIS — F1020 Alcohol dependence, uncomplicated: Secondary | ICD-10-CM

## 2022-06-25 DIAGNOSIS — I4891 Unspecified atrial fibrillation: Secondary | ICD-10-CM

## 2022-06-25 MED ORDER — HYDROCHLOROTHIAZIDE 25 MG PO TABS: 25.0000 mg | ORAL_TABLET | Freq: Every day | ORAL | 1 refills | Status: DC

## 2022-06-25 MED ORDER — ATORVASTATIN CALCIUM 80 MG PO TABS: 80.0000 mg | ORAL_TABLET | Freq: Every day | ORAL | 1 refills | Status: DC

## 2022-06-25 MED ORDER — FENOFIBRATE MICRONIZED 134 MG PO CAPS: 134.0000 mg | ORAL_CAPSULE | Freq: Every day | ORAL | 1 refills | Status: DC

## 2022-06-25 MED ORDER — AMLODIPINE BESYLATE 5 MG PO TABS: 5.0000 mg | ORAL_TABLET | Freq: Every day | ORAL | 1 refills | Status: DC

## 2022-06-25 MED ORDER — BENAZEPRIL HCL 40 MG PO TABS: 40.0000 mg | ORAL_TABLET | Freq: Every day | ORAL | 1 refills | Status: DC

## 2022-06-25 NOTE — Assessment & Plan Note (Signed)
Has completed treatment. Last PSA in October 0.4.

## 2022-06-25 NOTE — Assessment & Plan Note (Signed)
Chronic.  Controlled.  Rarely drinking alcohol.  Labs ordered today.  Return to clinic in 6 months for reevaluation.  Call sooner if concerns arise.

## 2022-06-25 NOTE — Assessment & Plan Note (Signed)
Chronic.  Controlled.  Continue with current medication regimen of Atorvastatin daily.  Refills sent today.  Labs ordered today.  Return to clinic in 6 months for reevaluation.  Call sooner if concerns arise.    

## 2022-06-25 NOTE — Assessment & Plan Note (Signed)
Chronic.  Controlled.  Continue with current medication regimen of Benzepril, Amlodipine, and HCTZ.  Refills sent today.  Labs ordered today.  Return to clinic in 6 months for reevaluation.  Call sooner if concerns arise.

## 2022-06-25 NOTE — Assessment & Plan Note (Signed)
Chronic.  [facility].  Has a ECHO scheduled for tomorrow and CT scan scheduled in February.  He will have an appointment with Dr. Kipp Brood surgeon after the CT.

## 2022-06-25 NOTE — Assessment & Plan Note (Signed)
Chronic.  Controlled.  Continue with current medication regimen of Spiriva.  Rarely uses Albuterol.  Labs ordered today.  Return to clinic in 6 months for reevaluation.  Call sooner if concerns arise.

## 2022-06-25 NOTE — Assessment & Plan Note (Signed)
Chronic.  Rate controlled.  Continue with current medication regimen.  Followed by [facility].  Labs ordered today.  Return to clinic in 6 months for reevaluation.  Call sooner if concerns arise.

## 2022-06-25 NOTE — Progress Notes (Signed)
BP 136/75 (BP Location: Right Arm, Cuff Size: Normal)   Pulse 76   Temp 98 F (36.7 C) (Oral)   Wt 160 lb 9.6 oz (72.8 kg)   SpO2 96%   BMI 25.15 kg/m    Subjective:    Patient ID: Carl Allen, male    DOB: 04/28/1946, 77 y.o.   MRN: [id number]  HPI: Carl Allen is a 77 y.o. male  Chief Complaint  Patient presents with   COPD   Hyperlipidemia   Hypertension   HYPERTENSION / HYPERLIPIDEMIA Satisfied with current treatment? yes Duration of hypertension: years BP monitoring frequency: daily BP range: 120/70 BP medication side effects: no Past BP meds: amlodipine, benazepril, and HCTZ Duration of hyperlipidemia: years Cholesterol medication side effects: yes Cholesterol supplements: none Past cholesterol medications: atorvastain (lipitor) Medication compliance: excellent compliance Aspirin: no Recent stressonors: no Recurrent headaches: no Visual changes: no Palpitations: no Dyspnea: no Chest pain: no Lower extremity edema: no Dizzy/lightheaded: no  ALCOHOL USE Very seldom drinks alcohol.  Used to have a problem.  Only drinks socially now and that is rare.    COPD COPD status: controlled Satisfied with current treatment?: yes Oxygen use: no Dyspnea frequency: when he is active Cough frequency: yes  Rescue inhaler frequency:  very seldom Limitation of activity: yes Productive cough: no Last Spirometry:  Pneumovax: Up to Date Influenza: Up to Date     Relevant past medical, surgical, family and social history reviewed and updated as indicated. Interim medical history since our last visit reviewed. Allergies and medications reviewed and updated.  Review of Systems  Eyes:  Negative for visual disturbance.  Respiratory:  Negative for shortness of breath.   Cardiovascular:  Negative for chest pain and leg swelling.  Neurological:  Negative for light-headedness and headaches.    Per HPI unless specifically indicated above     Objective:     BP 136/75 (BP Location: Right Arm, Cuff Size: Normal)   Pulse 76   Temp 98 F (36.7 C) (Oral)   Wt 160 lb 9.6 oz (72.8 kg)   SpO2 96%   BMI 25.15 kg/m   Wt Readings from Last 3 Encounters:  06/25/22 160 lb 9.6 oz (72.8 kg)  03/05/22 158 lb (71.7 kg)  12/01/21 154 lb 3.2 oz (69.9 kg)    Physical Exam Vitals and nursing note reviewed.  Constitutional:      General: He is not in acute distress.    Appearance: Normal appearance. He is not ill-appearing, toxic-appearing or diaphoretic.  HENT:     Head: Normocephalic.     Right Ear: External ear normal.     Left Ear: External ear normal.     Nose: Nose normal. No congestion or rhinorrhea.     Mouth/Throat:     Mouth: Mucous membranes are moist.  Eyes:     General:        Right eye: No discharge.        Left eye: No discharge.     Extraocular Movements: Extraocular movements intact.     Conjunctiva/sclera: Conjunctivae normal.     Pupils: Pupils are equal, round, and reactive to light.  Cardiovascular:     Rate and Rhythm: Normal rate and regular rhythm.     Heart sounds: No murmur heard. Pulmonary:     Effort: Pulmonary effort is normal. No respiratory distress.     Breath sounds: Normal breath sounds. No wheezing, rhonchi or rales.  Abdominal:     General: Abdomen is  flat. Bowel sounds are normal.  Musculoskeletal:     Cervical back: Normal range of motion and neck supple.  Skin:    General: Skin is warm and dry.     Capillary Refill: Capillary refill takes less than 2 seconds.  Neurological:     General: No focal deficit present.     Mental Status: He is alert and oriented to person, place, and time.  Psychiatric:        Mood and Affect: Mood normal.        Behavior: Behavior normal.        Thought Content: Thought content normal.        Judgment: Judgment normal.     Results for orders placed or performed in visit on 03/03/22  PSA  Result Value Ref Range   Prostate Specific Ag, Serum 0.4 0.0 - 4.0 ng/mL       Assessment & Plan:   Problem List Items Addressed This Visit       Cardiovascular and Mediastinum   Essential hypertension    Chronic.  Controlled.  Continue with current medication regimen of Benzepril, Amlodipine, and HCTZ.  Refills sent today.  Labs ordered today.  Return to clinic in 6 months for reevaluation.  Call sooner if concerns arise.        Relevant Medications   amLODipine (NORVASC) 5 MG tablet   atorvastatin (LIPITOR) 80 MG tablet   benazepril (LOTENSIN) 40 MG tablet   fenofibrate micronized (LOFIBRA) 134 MG capsule   hydrochlorothiazide (HYDRODIURIL) 25 MG tablet   Other Relevant Orders   Comp Met (CMET)   Aortic aneurysm without rupture (HCC) - Primary    Chronic.  [facility].  Has a ECHO scheduled for tomorrow and CT scan scheduled in February.  He will have an appointment with Dr. Kipp Brood surgeon after the CT.        Relevant Medications   amLODipine (NORVASC) 5 MG tablet   atorvastatin (LIPITOR) 80 MG tablet   benazepril (LOTENSIN) 40 MG tablet   fenofibrate micronized (LOFIBRA) 134 MG capsule   hydrochlorothiazide (HYDRODIURIL) 25 MG tablet   Atrial fibrillation, unspecified type (HCC)    Chronic.  Rate controlled.  Continue with current medication regimen.  Followed by [facility].  Labs ordered today.  Return to clinic in 6 months for reevaluation.  Call sooner if concerns arise.        Relevant Medications   amLODipine (NORVASC) 5 MG tablet   atorvastatin (LIPITOR) 80 MG tablet   benazepril (LOTENSIN) 40 MG tablet   fenofibrate micronized (LOFIBRA) 134 MG capsule   hydrochlorothiazide (HYDRODIURIL) 25 MG tablet     Respiratory   COPD (chronic obstructive pulmonary disease) (HCC)    Chronic.  Controlled.  Continue with current medication regimen of Spiriva.  Rarely uses Albuterol.  Labs ordered today.  Return to clinic in 6 months for reevaluation.  Call sooner if concerns arise.        Relevant Orders   CBC  w/Diff     Genitourinary   Prostate cancer ([place])    Has completed treatment. Last PSA in October 0.4.         Other   Hyperlipidemia    Chronic.  Controlled.  Continue with current medication regimen of Atorvastatin daily.  Refills sent today.  Labs ordered today.  Return to clinic in 6 months for reevaluation.  Call sooner if concerns arise.        Relevant Medications   amLODipine (  NORVASC) 5 MG tablet   atorvastatin (LIPITOR) 80 MG tablet   benazepril (LOTENSIN) 40 MG tablet   fenofibrate micronized (LOFIBRA) 134 MG capsule   hydrochlorothiazide (HYDRODIURIL) 25 MG tablet   Other Relevant Orders   Lipid Profile   Chronic alcoholism (HCC)    Chronic.  Controlled.  Rarely drinking alcohol.  Labs ordered today.  Return to clinic in 6 months for reevaluation.  Call sooner if concerns arise.        Relevant Orders   CBC w/Diff   Need for influenza vaccination   Relevant Orders   Flu Vaccine QUAD High Dose(Fluad) (Completed)   Other Visit Diagnoses     Hypercholesteremia       Relevant Medications   amLODipine (NORVASC) 5 MG tablet   atorvastatin (LIPITOR) 80 MG tablet   benazepril (LOTENSIN) 40 MG tablet   fenofibrate micronized (LOFIBRA) 134 MG capsule   hydrochlorothiazide (HYDRODIURIL) 25 MG tablet        Follow up plan: Return in about 5 months (around 11/24/2022) for Physical and Fasting labs.

## 2022-06-26 ENCOUNTER — Ambulatory Visit: Payer: PPO | Attending: Thoracic Surgery (Cardiothoracic Vascular Surgery)

## 2022-06-26 DIAGNOSIS — Q231 Congenital insufficiency of aortic valve: Secondary | ICD-10-CM | POA: Diagnosis not present

## 2022-06-26 LAB — CBC WITH DIFFERENTIAL/PLATELET
Basophils Absolute: 0.1 10*3/uL (ref 0.0–0.2)
Basos: 1 %
EOS (ABSOLUTE): 0.1 10*3/uL (ref 0.0–0.4)
Eos: 2 %
Hematocrit: 41.2 % (ref 37.5–51.0)
Hemoglobin: 13.6 g/dL (ref 13.0–17.7)
Immature Grans (Abs): 0 10*3/uL (ref 0.0–0.1)
Immature Granulocytes: 1 %
Lymphocytes Absolute: 2 10*3/uL (ref 0.7–3.1)
Lymphs: 32 %
MCH: 31.3 pg (ref 26.6–33.0)
MCHC: 33 g/dL (ref 31.5–35.7)
MCV: 95 fL (ref 79–97)
Monocytes Absolute: 0.6 10*3/uL (ref 0.1–0.9)
Monocytes: 9 %
Neutrophils Absolute: 3.6 10*3/uL (ref 1.4–7.0)
Neutrophils: 55 %
Platelets: 229 10*3/uL (ref 150–450)
RBC: 4.34 x10E6/uL (ref 4.14–5.80)
RDW: 13.1 % (ref 11.6–15.4)
WBC: 6.4 10*3/uL (ref 3.4–10.8)

## 2022-06-26 LAB — COMPREHENSIVE METABOLIC PANEL
ALT: 11 IU/L (ref 0–44)
AST: 13 IU/L (ref 0–40)
Albumin/Globulin Ratio: 2 (ref 1.2–2.2)
Albumin: 4.5 g/dL (ref 3.8–4.8)
Alkaline Phosphatase: 63 IU/L (ref 44–121)
BUN/Creatinine Ratio: 18 (ref 10–24)
BUN: 16 mg/dL (ref 8–27)
Bilirubin Total: 0.3 mg/dL (ref 0.0–1.2)
CO2: 25 mmol/L (ref 20–29)
Calcium: 9.6 mg/dL (ref 8.6–10.2)
Chloride: 106 mmol/L (ref 96–106)
Creatinine, Ser: 0.9 mg/dL (ref 0.76–1.27)
Globulin, Total: 2.3 g/dL (ref 1.5–4.5)
Glucose: 81 mg/dL (ref 70–99)
Potassium: 4.4 mmol/L (ref 3.5–5.2)
Sodium: 144 mmol/L (ref 134–144)
Total Protein: 6.8 g/dL (ref 6.0–8.5)
eGFR: 89 mL/min/{1.73_m2} (ref >59)

## 2022-06-26 LAB — ECHOCARDIOGRAM COMPLETE
AR max vel: 2.73 cm2
AV Area VTI: 2.8 cm2
AV Area mean vel: 2.78 cm2
AV Mean grad: 4 mmHg
AV Peak grad: 7.7 mmHg
Ao pk vel: 1.39 m/s
Area-P 1/2: 2.93 cm2
S' Lateral: 2.1 cm

## 2022-06-26 LAB — LIPID PANEL
Chol/HDL Ratio: 3.7 ratio (ref 0.0–5.0)
Cholesterol, Total: 114 mg/dL (ref 100–199)
HDL: 31 mg/dL — ABNORMAL LOW (ref >39)
LDL Chol Calc (NIH): 48 mg/dL (ref 0–99)
Triglycerides: 217 mg/dL — ABNORMAL HIGH (ref 0–149)
VLDL Cholesterol Cal: 35 mg/dL (ref 5–40)

## 2022-06-26 NOTE — Progress Notes (Signed)
Please let patient know that his lab work looks great.  His cholesterol has improved form prior.  Keep up the good work.  Liver, kidneys and electrolytes look great. No concerns at this time.

## 2022-06-30 ENCOUNTER — Other Ambulatory Visit: Payer: Self-pay | Admitting: Nurse Practitioner

## 2022-06-30 DIAGNOSIS — J410 Simple chronic bronchitis: Secondary | ICD-10-CM

## 2022-06-30 MED ORDER — TIOTROPIUM BROMIDE MONOHYDRATE 18 MCG IN CAPS: 18.0000 ug | ORAL_CAPSULE | Freq: Every morning | RESPIRATORY_TRACT | 0 refills | Status: DC
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
## 2022-06-30 NOTE — Telephone Encounter (Signed)
Medication Refill - Medication: tiotropium (SPIRIVA HANDIHALER) 18 MCG inhalation capsule   Has the patient contacted their pharmacy? yes (Agent: If no, request that the patient contact the pharmacy for the refill. If patient does not wish to contact the pharmacy document the reason why and proceed with request.) (Agent: If yes, when and what did the pharmacy advise?)contact pcp  Preferred Pharmacy (with phone number or street name): [place], [place] Phone: [phone]  Fax: [phone]   Has the patient been seen for an appointment in the last year OR does the patient have an upcoming appointment? yes  Agent: Please be advised that RX refills may take up to 3 business days. We ask that you follow-up with your pharmacy.

## 2022-07-12 IMAGING — CT CT CHEST LUNG CANCER SCREENING LOW DOSE W/O CM
2 of 5 series · 15 of 40 positions shown, 18 images · non-contrast
Comparison: None.

CLINICAL DATA: Current smoker, 57 pack-year history.

EXAM:
CT CHEST WITHOUT CONTRAST LOW-DOSE FOR LUNG CANCER SCREENING
TECHNIQUE: Multidetector CT imaging of the chest was performed following the
standard protocol without IV contrast.

[Series 3: lung 1.00 · axial · 0.68mm/px · z∈[-1194,-905]mm · 12 of 319 slices shown, 15 images]
[im 15/319  mediastinal]
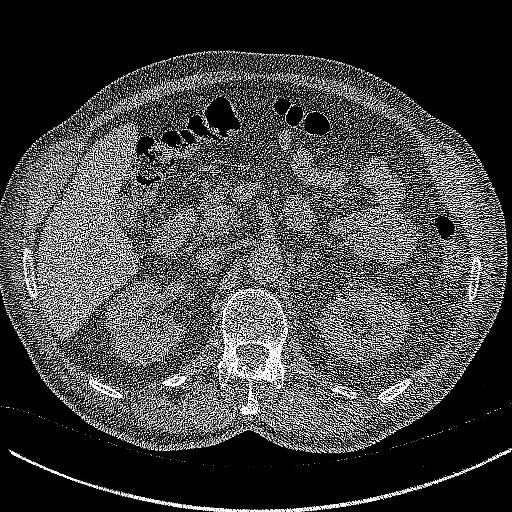
[im 15/319  lung]
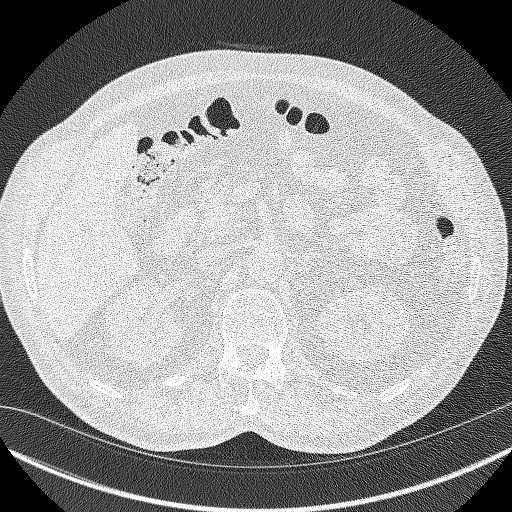
[im 44/319  lung]
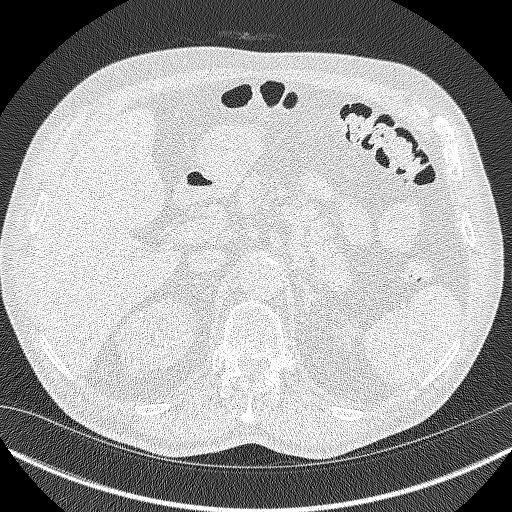
[im 73/319  lung]
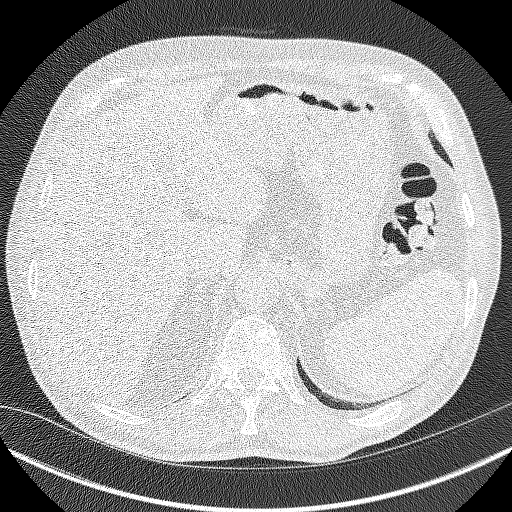
[im 102/319  lung]
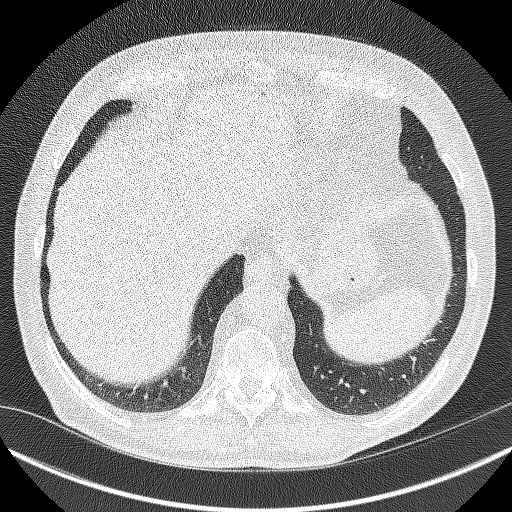
[im 116/319  mediastinal]
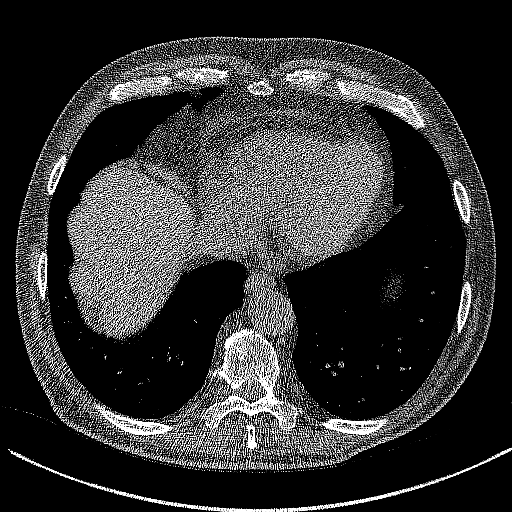
[im 116/319  lung]
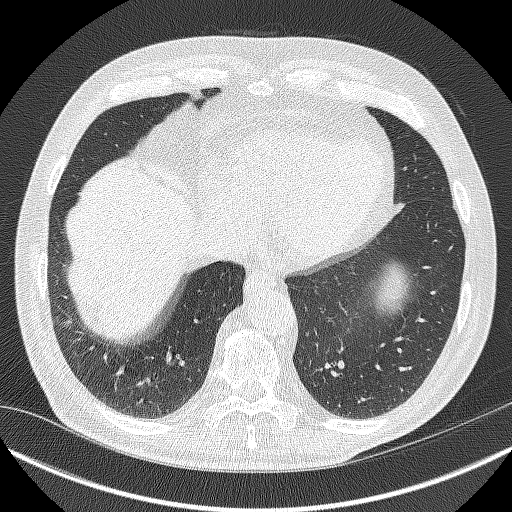
[im 145/319  lung]
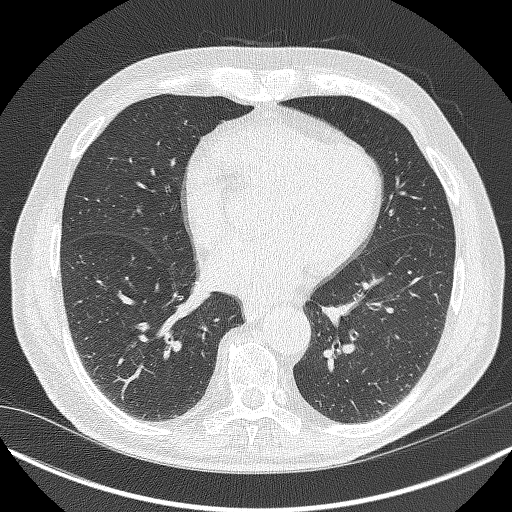
[im 174/319  lung]
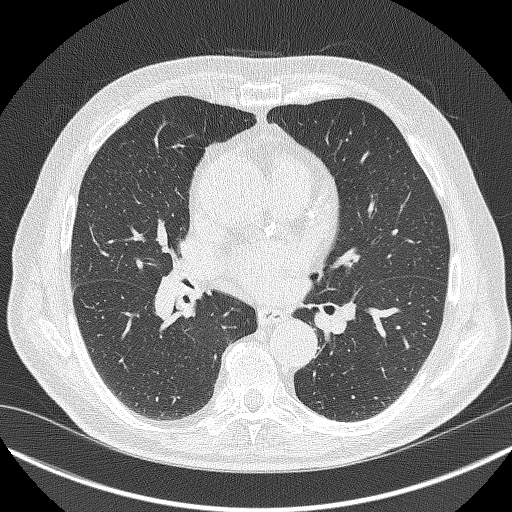
[im 203/319  lung]
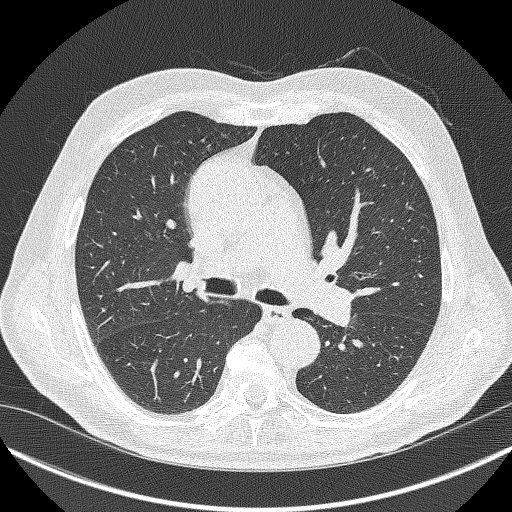
[im 217/319  mediastinal]
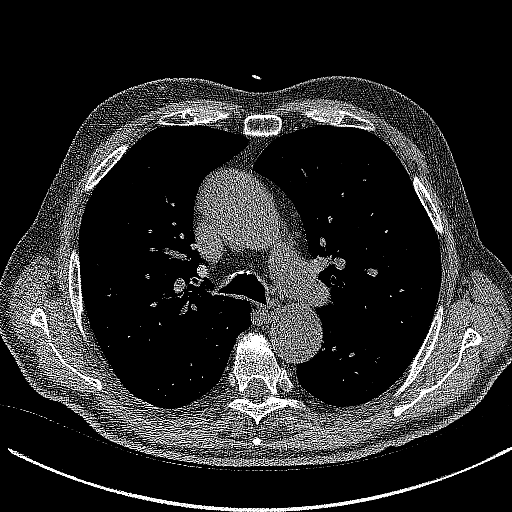
[im 217/319  lung]
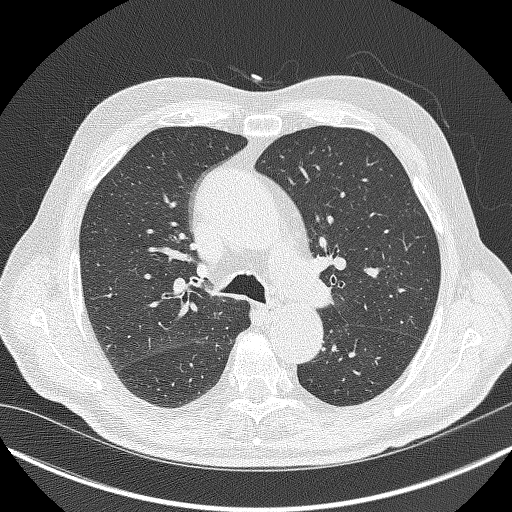
[im 246/319  lung]
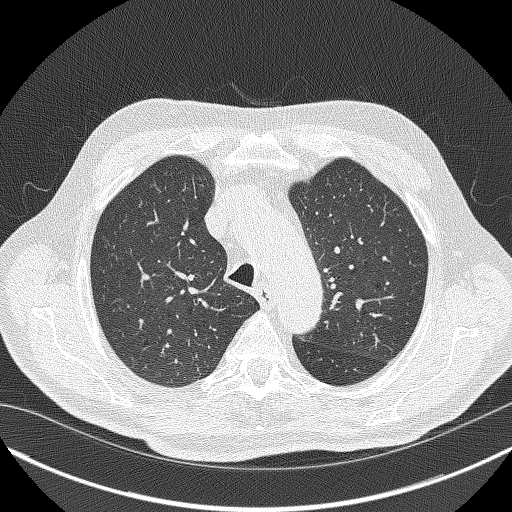
[im 275/319  lung]
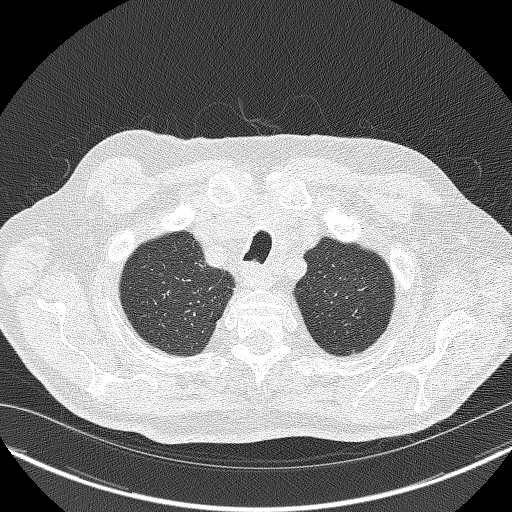
[im 304/319  lung]
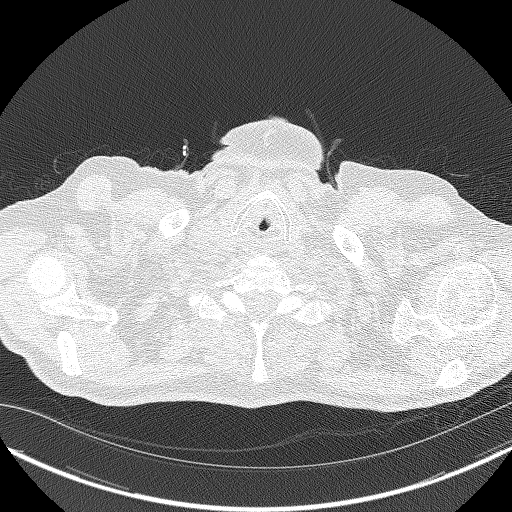

[Series 5: coronals lung 1.00 cor · coronal · 0.62mm/px · 3 of 305 slices shown]
[im 61/305  lung]
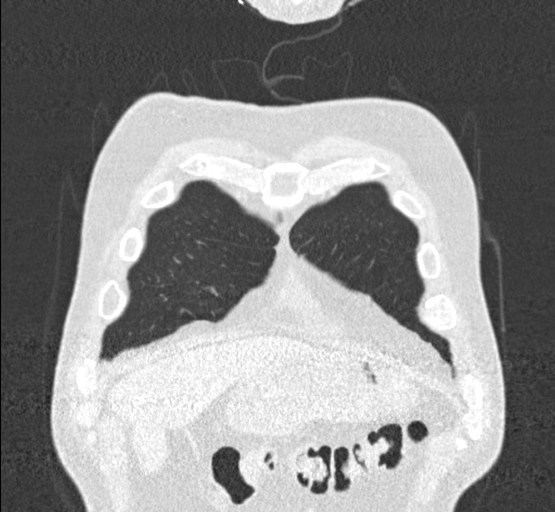
[im 122/305  lung]
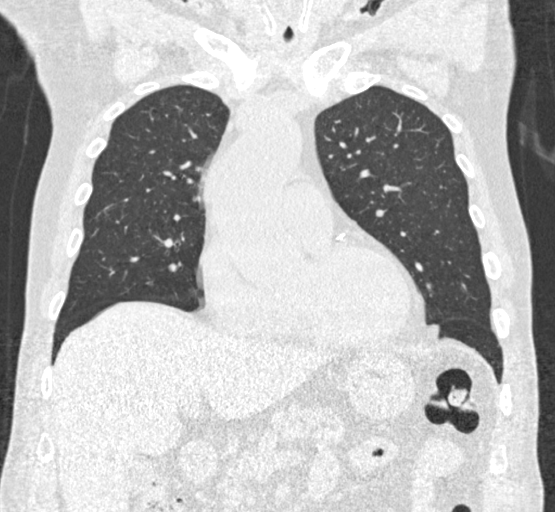
[im 183/305  lung]
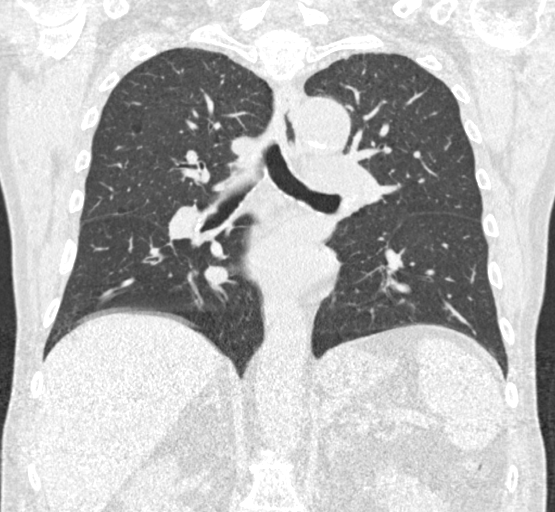

[15 of 40 positions shown; findings below may reference images not displayed]

FINDINGS: Cardiovascular: Atherosclerotic calcification of the aorta and
coronary arteries. Ascending aorta measures 4.8 cm. Heart is at the
upper limits of normal in size to mildly enlarged. No pericardial
effusion.

Mediastinum/Nodes: No pathologically enlarged mediastinal or
axillary lymph nodes. Hilar regions are difficult to definitively
evaluate without IV contrast. Esophagus is grossly unremarkable.

Lungs/Pleura: Mild centrilobular and paraseptal emphysema. Smoking
related respiratory bronchiolitis. Scattered mucoid impaction. No
suspicious pulmonary nodules. No pleural fluid. Airway is
unremarkable.

Upper Abdomen: Visualized portions of the liver, gallbladder and
adrenal glands are unremarkable. There may be a subcentimeter
exophytic lesion off the upper pole right kidney, too small to
characterize. Visualized portions of the kidneys, spleen, pancreas,
stomach and bowel are otherwise grossly unremarkable.

Musculoskeletal: Degenerative changes in the spine. Old bilateral
rib fractures. Mild compression of the T11 vertebral body, likely
old.
IMPRESSION: 1. Lung-RADS 2, benign appearance or behavior. Continue annual
screening with low-dose chest CT without contrast in 12 months.
2. Ascending aortic aneurysm. Ascending thoracic aortic aneurysm.
Recommend semi-annual imaging followup by CTA or MRA and referral to
cardiothoracic surgery if not already obtained. This recommendation
follows 0353 ACCF/AHA/AATS/ACR/ASA/SCA/SOLO PARA TI/JEREZ/WOODS/VALY Guidelines
for the Diagnosis and Management of Patients With Thoracic Aortic
Disease. Circulation. 0353; 121: E266-e369. Aortic aneurysm NOS
(B2C6O-KML.Y).
3. Aortic atherosclerosis (B2C6O-R4F.F). Coronary artery
calcification.
4.  Emphysema (B2C6O-U8G.K).

## 2022-07-23 ENCOUNTER — Ambulatory Visit
Admission: RE | Admit: 2022-07-23 | Discharge: 2022-07-23 | Disposition: A | Discharge status: Home / Self Care | Payer: PPO | Source: Ambulatory Visit | Attending: Thoracic Surgery (Cardiothoracic Vascular Surgery) | Admitting: Thoracic Surgery (Cardiothoracic Vascular Surgery)

## 2022-07-23 DIAGNOSIS — I7121 Aneurysm of the ascending aorta, without rupture: Secondary | ICD-10-CM

## 2022-07-23 MED ORDER — IOPAMIDOL (ISOVUE-370) INJECTION 76%
65.0000 mL | Freq: Once | INTRAVENOUS | Status: AC | PRN
  Administered 2022-07-23: 65 mL via INTRAVENOUS

## 2022-07-24 ENCOUNTER — Other Ambulatory Visit: Payer: PPO

## 2022-07-24 ENCOUNTER — Ambulatory Visit (INDEPENDENT_AMBULATORY_CARE_PROVIDER_SITE_OTHER): Payer: PPO | Admitting: Thoracic Surgery (Cardiothoracic Vascular Surgery)

## 2022-07-24 DIAGNOSIS — I7121 Aneurysm of the ascending aorta, without rupture: Secondary | ICD-10-CM | POA: Diagnosis not present

## 2022-07-24 NOTE — Progress Notes (Signed)
       [address]       [place] [address]             [phone]        Patient: Home Provider: Office Consent for Telemedicine visit obtained.   Today's visit was completed via a real-time telehealth (see specific modality noted below). The patient/authorized person provided oral consent at the time of the visit to engage in a telemedicine encounter with the present provider at [facility]. The patient/authorized person was informed of the potential benefits, limitations, and risks of telemedicine. The patient/authorized person expressed understanding that the laws that protect confidentiality also apply to telemedicine. The patient/authorized person acknowledged understanding that telemedicine does not provide emergency services and that he or she would need to call 911 or proceed to the nearest hospital for help if such a need arose.   Total time spent in the clinical discussion 10 minutes.  Telehealth Modality: Phone visit (audio only)  I had a telephone visit with Carl Allen.  He is under surveillance for a 4.4 cm ascending aortic aneurysm.  Cross-sectional imaging from earlier this month shows that the aneurysm has been stable.  He continues to take his blood pressure medication and cholesterol medication.  He does also continue to smoke and I reiterated the importance of smoking cessation with him.  His echocardiogram shows normal function of the aortic valve.  Unclear if it is bicuspid.  He will follow-up in 1 year virtually with another CT aortogram.

## 2022-07-29 ENCOUNTER — Encounter: Payer: PPO | Admitting: *Deleted

## 2022-08-03 ENCOUNTER — Ambulatory Visit: Payer: Self-pay | Admitting: *Deleted

## 2022-08-03 NOTE — Patient Outreach (Signed)
  Care Coordination   Follow Up Visit Note   08/03/2022 Name: Carl Allen MRN: [id number] DOB: Oct 10, 1945  Carl Allen is a 77 y.o. year old male who sees Carl Billings, NP for primary care. I spoke with  Carl Allen by phone today.  What matters to the patients health and wellness today?  Continue current management to keep heart health controlled. He will continue to monitor blood pressure and follow up with providers as scheduled. Denies any urgent concerns, encouraged to contact this care manager with questions.      Goals Addressed             This Visit's Progress    COMPLETED: Management of aortic aneurysm   On track    Care Coordination Interventions: Evaluation of current treatment plan related to aortic aneurysm and patient's adherence to plan as established by provider Advised patient to call this RNCM if transportation to appointments become an issue Reviewed medications with patient and discussed adherence and affordability Reviewed scheduled/upcoming provider appointments including PCP on 1/25, cardiology on 2/6, scan and cardiothoracic surgeon appointment on 2/23.  Discussed scheduling appointment to receive flu and covid vaccinations - has office visit on 1/25 for vaccinations  Update 3/4 - Per patient, report aneurysm decreased in size from 4.8 to 4.4. echo results were "good" as well. Follow up recommended for 1 year.         SDOH assessments and interventions completed:  No     Care Coordination Interventions:  Yes, provided   Follow up plan: No further intervention required.   Encounter Outcome:  Pt. Visit Completed   Valente David, RN, MSN. [facility] Care Management Coordinator [phone]

## 2022-08-12 ENCOUNTER — Encounter: Payer: Self-pay | Admitting: Nurse Practitioner

## 2022-09-04 ENCOUNTER — Other Ambulatory Visit: Payer: PPO

## 2022-09-04 DIAGNOSIS — C61 Malignant neoplasm of prostate: Secondary | ICD-10-CM

## 2022-09-05 LAB — PSA: Prostate Specific Ag, Serum: 0.4 ng/mL (ref 0.0–4.0)

## 2022-09-07 ENCOUNTER — Telehealth: Payer: Self-pay | Admitting: *Deleted

## 2022-09-07 NOTE — Telephone Encounter (Signed)
-----   Message from Harle Battiest, PA-C sent at 09/06/2022  9:36 AM EDT ----- Please let [name] know that his PSA remains at 0.4.  We will him in October.

## 2022-09-07 NOTE — Telephone Encounter (Signed)
Patient notified and voiced understanding.

## 2022-10-01 ENCOUNTER — Other Ambulatory Visit: Payer: Self-pay | Admitting: Nurse Practitioner

## 2022-10-01 DIAGNOSIS — J410 Simple chronic bronchitis: Secondary | ICD-10-CM

## 2022-10-01 MED ORDER — TIOTROPIUM BROMIDE MONOHYDRATE 18 MCG IN CAPS: 18.0000 ug | ORAL_CAPSULE | Freq: Every morning | RESPIRATORY_TRACT | 0 refills | Status: DC

## 2022-10-01 NOTE — Telephone Encounter (Signed)
Medication Refill - Medication: tiotropium (SPIRIVA HANDIHALER) 18 MCG inhalation capsule   Pt is almost out of his current supply but will need enough to last him until his next appt. He says he was supposed to have received a 6 month supply back in January but only received 90 days.   Has the patient contacted their pharmacy? Yes.   (Agent: If no, request that the patient contact the pharmacy for the refill. If patient does not wish to contact the pharmacy document the reason why and proceed with request.) (Agent: If yes, when and what did the pharmacy advise?)  Preferred Pharmacy (with phone number or street name):  [facility] [address] [place] - [address]  [address] [place] [address]  Phone: [phone] Fax: [phone]   Has the patient been seen for an appointment in the last year OR does the patient have an upcoming appointment? Yes.    Agent: Please be advised that RX refills may take up to 3 business days. We ask that you follow-up with your pharmacy.
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
## 2022-11-24 ENCOUNTER — Ambulatory Visit: Payer: PPO | Admitting: Nurse Practitioner

## 2022-11-24 ENCOUNTER — Encounter: Payer: PPO | Admitting: Nurse Practitioner

## 2022-11-24 NOTE — Progress Notes (Deleted)
There were no vitals taken for this visit.   Subjective:    Patient ID: Carl Allen, male    DOB: 09-Sep-1945, 77 y.o.   MRN: [id number]  HPI: Carl Allen is a 77 y.o. male presenting on 11/24/2022 for comprehensive medical examination. Current medical complaints include:{Blank single:19197::"none","***"}  He currently lives with: Interim Problems from his last visit: {Blank single:19197::"yes","no"}  HYPERTENSION / HYPERLIPIDEMIA Satisfied with current treatment? yes Duration of hypertension: years BP monitoring frequency: daily BP range: 120/70 BP medication side effects: no Past BP meds: amlodipine, benazepril, and HCTZ Duration of hyperlipidemia: years Cholesterol medication side effects: yes Cholesterol supplements: none Past cholesterol medications: atorvastain (lipitor) Medication compliance: excellent compliance Aspirin: no Recent stressonors: no Recurrent headaches: no Visual changes: no Palpitations: no Dyspnea: no Chest pain: no Lower extremity edema: no Dizzy/lightheaded: no  ALCOHOL USE Very seldom drinks alcohol.  Used to have a problem.  Only drinks socially now and that is rare.    COPD COPD status: controlled Satisfied with current treatment?: yes Oxygen use: no Dyspnea frequency: when he is active Cough frequency: yes  Rescue inhaler frequency:  very seldom Limitation of activity: yes Productive cough: no Last Spirometry:  Pneumovax: Up to Date Influenza: Up to Date  Depression Screen done today and results listed below:     06/25/2022    1:38 PM 01/26/2022    9:52 AM 11/19/2021   10:28 AM 05/15/2021    9:28 AM 02/10/2021   11:09 AM  Depression screen PHQ 2/9  Decreased Interest 1 0 0 0 0  Down, Depressed, Hopeless 0 0 0 0 0  PHQ - 2 Score 1 0 0 0 0  Altered sleeping 1  1 1  0  Tired, decreased energy 0  0 0 0  Change in appetite 0  0 0 0  Feeling bad or failure about yourself  1  0 0 0  Trouble concentrating 0  0 0 0  Moving  slowly or fidgety/restless 0  0 0 0  Suicidal thoughts 0  0 0 0  PHQ-9 Score 3  1 1  0  Difficult doing work/chores Not difficult at all  Not difficult at all Not difficult at all Not difficult at all    The patient {has/does not have:19849} a history of falls. I {did/did not:19850} complete a risk assessment for falls. A plan of care for falls {was/was not:19852} documented.   Past Medical History:  Past Medical History:  Diagnosis Date   Allergy    Anxiety    not often   Arthritis    fingers, knees   Cancer (HCC)    Cataract    Chronic alcoholism (HCC)    COPD (chronic obstructive pulmonary disease) (HCC)    Depression    pt states sometimes   Dyspnea    Emphysema of lung (HCC)    GERD (gastroesophageal reflux disease)    pt states every now and then   Hypertension    Hypertriglyceridemia     Surgical History:  Past Surgical History:  Procedure Laterality Date   CATARACT EXTRACTION, BILATERAL  2014   COLONOSCOPY  2012   CYSTOSCOPY N/A 01/10/2018   Procedure: CYSTOSCOPY;  Surgeon: Vanna Scotland, MD;  Location: [facility] ORS;  Service: Urology;  Laterality: N/A;   RADIOACTIVE SEED IMPLANT N/A 01/10/2018   Procedure: RADIOACTIVE SEED IMPLANT/BRACHYTHERAPY IMPLANT;  Surgeon: Vanna Scotland, MD;  Location: [facility] ORS;  Service: Urology;  Laterality: N/A;   TONSILLECTOMY     TONSILLECTOMY AND ADENOIDECTOMY  1952    Medications:  Current Outpatient Medications on File Prior to Visit  Medication Sig   albuterol (VENTOLIN HFA) 108 (90 Base) MCG/ACT inhaler Inhale 2 puffs into the lungs every 6 (six) hours as needed for wheezing or shortness of breath.   amLODipine (NORVASC) 5 MG tablet Take 1 tablet (5 mg total) by mouth daily.   atorvastatin (LIPITOR) 80 MG tablet Take 1 tablet (80 mg total) by mouth daily.   benazepril (LOTENSIN) 40 MG tablet Take 1 tablet (40 mg total) by mouth daily.   fenofibrate micronized (LOFIBRA) 134 MG capsule Take 1 capsule (134 mg total) by mouth  daily before breakfast.   ferrous sulfate 325 (65 FE) MG tablet Take 325 mg by mouth daily with breakfast.   hydrochlorothiazide (HYDRODIURIL) 25 MG tablet Take 1 tablet (25 mg total) by mouth daily.   Multiple Vitamins-Minerals (CENTRUM SILVER 50+MEN) TABS Take 1 tablet by mouth daily.   nicotine (NICODERM CQ) 21 mg/24hr patch Place 1 patch (21 mg total) onto the skin daily. (Patient not taking: Reported on 06/25/2022)   tiotropium (SPIRIVA HANDIHALER) 18 MCG inhalation capsule Place 1 capsule (18 mcg total) into inhaler and inhale every morning.   Triamcinolone Acetonide (TRIAMCINOLONE 0.1 % CREAM : EUCERIN) CREA Apply 1 Application topically 3 (three) times daily as needed. Use only on eczematous rash   No current facility-administered medications on file prior to visit.    Allergies:  No Known Allergies  Social History:  Social History   Socioeconomic History   Marital status: Single    Spouse name: Not on file   Number of children: Not on file   Years of education: college   Highest education level: Bachelor's degree (e.g., BA, AB, BS)  Occupational History    Comment: Retired  Tobacco Use   Smoking status: Every Day    Packs/day: 0.50    Years: 57.00    Additional pack years: 0.00    Total pack years: 28.50    Types: Cigarettes   Smokeless tobacco: Never  Vaping Use   Vaping Use: Never used  Substance and Sexual Activity   Alcohol use: Not Currently    Comment: on occasion   Drug use: No   Sexual activity: Not Currently  Other Topics Concern   Not on file  Social History Narrative   Not on file   Social Determinants of Health   Financial Resource Strain: Low Risk  (01/26/2022)   Overall Financial Resource Strain (CARDIA)    Difficulty of Paying Living Expenses: Not hard at all  Food Insecurity: No Food Insecurity (01/26/2022)   Hunger Vital Sign    Worried About Running Out of Food in the Last Year: Never true    Ran Out of Food in the Last Year: Never true   Transportation Needs: No Transportation Needs (01/26/2022)   PRAPARE - Administrator, Civil Service (Medical): No    Lack of Transportation (Non-Medical): No  Physical Activity: Inactive (01/26/2022)   Exercise Vital Sign    Days of Exercise per Week: 0 days    Minutes of Exercise per Session: 0 min  Stress: No Stress Concern Present (01/26/2022)   [employer] of Occupational Health - Occupational Stress Questionnaire    Feeling of Stress : Not at all  Social Connections: Unknown (01/26/2022)   Social Connection and Isolation Panel [NHANES]    Frequency of Communication with Friends and Family: Never    Frequency of Social Gatherings with Friends and Family:  Never    Attends Religious Services: Never    Active Member of Clubs or Organizations: No    Attends Banker Meetings: Never    Marital Status: Not on file  Intimate Partner Violence: Not At Risk (01/26/2022)   Humiliation, Afraid, Rape, and Kick questionnaire    Fear of Current or Ex-Partner: No    Emotionally Abused: No    Physically Abused: No    Sexually Abused: No   Social History   Tobacco Use  Smoking Status Every Day   Packs/day: 0.50   Years: 57.00   Additional pack years: 0.00   Total pack years: 28.50   Types: Cigarettes  Smokeless Tobacco Never   Social History   Substance and Sexual Activity  Alcohol Use Not Currently   Comment: on occasion    Family History:  Family History  Problem Relation Age of Onset   Arthritis Mother    Heart disease Mother    Hearing loss Mother    Hyperlipidemia Mother    Hypertension Mother    Kidney disease Mother    Miscarriages / [place] Mother    Vision loss Mother    Stroke Father    Heart disease Maternal Grandmother    Stroke Maternal Grandfather    Cancer Paternal Grandfather     Past medical history, surgical history, medications, allergies, family history and social history reviewed with patient today and changes made to  appropriate areas of the chart.   ROS All other ROS negative except what is listed above and in the HPI.      Objective:    There were no vitals taken for this visit.  Wt Readings from Last 3 Encounters:  06/25/22 160 lb 9.6 oz (72.8 kg)  03/05/22 158 lb (71.7 kg)  12/01/21 154 lb 3.2 oz (69.9 kg)    Physical Exam  Results for orders placed or performed in visit on 09/04/22  PSA  Result Value Ref Range   Prostate Specific Ag, Serum 0.4 0.0 - 4.0 ng/mL      Assessment & Plan:   Problem List Items Addressed This Visit       Cardiovascular and Mediastinum   Essential hypertension - Primary   Aortic aneurysm without rupture (HCC)   Atrial fibrillation, unspecified type (HCC)     Other   Hyperlipidemia   Chronic alcoholism (HCC)     Discussed aspirin prophylaxis for myocardial infarction prevention and decision was {Blank single:19197::"it was not indicated","made to continue ASA","made to start ASA","made to stop ASA","that we recommended ASA, and patient refused"}  LABORATORY TESTING:  Health maintenance labs ordered today as discussed above.   The natural history of prostate cancer and ongoing controversy regarding screening and potential treatment outcomes of prostate cancer has been discussed with the patient. The meaning of a false positive PSA and a false negative PSA has been discussed. He indicates understanding of the limitations of this screening test and wishes *** to proceed with screening PSA testing.   IMMUNIZATIONS:   - Tdap: Tetanus vaccination status reviewed: {tetanus status:315746}. - Influenza: {Blank single:19197::"Up to date","Administered today","Postponed to flu season","Refused","Given elsewhere"} - Pneumovax: {Blank single:19197::"Up to date","Administered today","Not applicable","Refused","Given elsewhere"} - Prevnar: {Blank single:19197::"Up to date","Administered today","Not applicable","Refused","Given elsewhere"} - COVID: {Blank  single:19197::"Up to date","Administered today","Not applicable","Refused","Given elsewhere"} - HPV: {Blank single:19197::"Up to date","Administered today","Not applicable","Refused","Given elsewhere"} - Shingrix vaccine: {Blank single:19197::"Up to date","Administered today","Not applicable","Refused","Given elsewhere"}  SCREENING: - Colonoscopy: {Blank single:19197::"Up to date","Ordered today","Not applicable","Refused","Done elsewhere"}  Discussed with patient purpose of  the colonoscopy is to detect colon cancer at curable precancerous or early stages   - AAA Screening: {Blank single:19197::"Up to date","Ordered today","Not applicable","Refused","Done elsewhere"}  -Hearing Test: {Blank single:19197::"Up to date","Ordered today","Not applicable","Refused","Done elsewhere"}  -Spirometry: {Blank single:19197::"Up to date","Ordered today","Not applicable","Refused","Done elsewhere"}   PATIENT COUNSELING:    Sexuality: Discussed sexually transmitted diseases, partner selection, use of condoms, avoidance of unintended pregnancy  and contraceptive alternatives.   Advised to avoid cigarette smoking.  I discussed with the patient that most people either abstain from alcohol or drink within safe limits (<=14/week and <=4 drinks/occasion for males, <=7/weeks and <= 3 drinks/occasion for females) and that the risk for alcohol disorders and other health effects rises proportionally with the number of drinks per week and how often a drinker exceeds daily limits.  Discussed cessation/primary prevention of drug use and availability of treatment for abuse.   Diet: Encouraged to adjust caloric intake to maintain  or achieve ideal body weight, to reduce intake of dietary saturated fat and total fat, to limit sodium intake by avoiding high sodium foods and not adding table salt, and to maintain adequate dietary potassium and calcium preferably from fresh fruits, vegetables, and low-fat dairy products.     stressed the importance of regular exercise  Injury prevention: Discussed safety belts, safety helmets, smoke detector, smoking near bedding or upholstery.   Dental health: Discussed importance of regular tooth brushing, flossing, and dental visits.   Follow up plan: NEXT PREVENTATIVE PHYSICAL DUE IN 1 YEAR. No follow-ups on file.

## 2022-11-26 ENCOUNTER — Ambulatory Visit (INDEPENDENT_AMBULATORY_CARE_PROVIDER_SITE_OTHER): Payer: PPO | Admitting: Nurse Practitioner

## 2022-11-26 ENCOUNTER — Encounter: Payer: Self-pay | Admitting: Nurse Practitioner

## 2022-11-26 VITALS — BP 136/72 | HR 65 | Temp 98.6°F | Ht 67.5 in | Wt 160.8 lb

## 2022-11-26 DIAGNOSIS — J410 Simple chronic bronchitis: Secondary | ICD-10-CM

## 2022-11-26 DIAGNOSIS — Z0000 Encounter for general adult medical examination without abnormal findings: Secondary | ICD-10-CM

## 2022-11-26 DIAGNOSIS — I719 Aortic aneurysm of unspecified site, without rupture: Secondary | ICD-10-CM

## 2022-11-26 DIAGNOSIS — E7800 Pure hypercholesterolemia, unspecified: Secondary | ICD-10-CM

## 2022-11-26 DIAGNOSIS — I4891 Unspecified atrial fibrillation: Secondary | ICD-10-CM

## 2022-11-26 DIAGNOSIS — I10 Essential (primary) hypertension: Secondary | ICD-10-CM

## 2022-11-26 LAB — URINALYSIS, ROUTINE W REFLEX MICROSCOPIC
Bilirubin, UA: NEGATIVE
Glucose, UA: NEGATIVE
Ketones, UA: NEGATIVE
Leukocytes,UA: NEGATIVE
Nitrite, UA: NEGATIVE
Protein,UA: NEGATIVE
RBC, UA: NEGATIVE
Specific Gravity, UA: 1.015 (ref 1.005–1.030)
Urobilinogen, Ur: 0.2 mg/dL (ref 0.2–1.0)
pH, UA: 7.5 (ref 5.0–7.5)

## 2022-11-26 MED ORDER — HYDROCHLOROTHIAZIDE 25 MG PO TABS: 25.0000 mg | ORAL_TABLET | Freq: Every day | ORAL | 1 refills | Status: DC

## 2022-11-26 MED ORDER — BENAZEPRIL HCL 40 MG PO TABS
40.0000 mg | ORAL_TABLET | Freq: Every day | ORAL | 1 refills | Status: DC
Start: 2022-11-26 — End: 2023-06-15

## 2022-11-26 MED ORDER — AMLODIPINE BESYLATE 5 MG PO TABS: 5.0000 mg | ORAL_TABLET | Freq: Every day | ORAL | 1 refills | Status: DC

## 2022-11-26 MED ORDER — ATORVASTATIN CALCIUM 80 MG PO TABS: 80.0000 mg | ORAL_TABLET | Freq: Every day | ORAL | 1 refills | Status: DC

## 2022-11-26 MED ORDER — TIOTROPIUM BROMIDE MONOHYDRATE 18 MCG IN CAPS: 18.0000 ug | ORAL_CAPSULE | Freq: Every morning | RESPIRATORY_TRACT | 1 refills | Status: DC

## 2022-11-26 MED ORDER — FENOFIBRATE MICRONIZED 134 MG PO CAPS
134.0000 mg | ORAL_CAPSULE | Freq: Every day | ORAL | 1 refills | Status: DC
Start: 2022-11-26 — End: 2023-06-15

## 2022-11-26 NOTE — Assessment & Plan Note (Signed)
Chronic.  Rate controlled.  Continue with current medication regimen.  Followed by [facility].  Continue to follow up with specialist.  Labs ordered today.  Return to clinic in 6 months for reevaluation.  Call sooner if concerns arise.

## 2022-11-26 NOTE — Assessment & Plan Note (Signed)
Chronic.  Controlled.  Continue with current medication regimen of Spiriva.  Refilled today.  Rarely uses Albuterol.  Labs ordered today.  Return to clinic in 6 months for reevaluation.  Call sooner if concerns arise.

## 2022-11-26 NOTE — Progress Notes (Signed)
BP 136/72   Pulse 65   Temp 98.6 F (37 C) (Oral)   Ht 5' 7.5" (1.715 m)   Wt 160 lb 12.8 oz (72.9 kg)   SpO2 96%   BMI 24.81 kg/m    Subjective:    Patient ID: Carl Allen, male    DOB: [birth year], 77 y.o.   MRN: [id number]  HPI: Carl Allen is a 77 y.o. male presenting on 11/26/2022 for comprehensive medical examination. Current medical complaints include:none  He currently lives with: Interim Problems from his last visit: no  HYPERTENSION / HYPERLIPIDEMIA Satisfied with current treatment? yes Duration of hypertension: years BP monitoring frequency: daily BP range: 120-130/70-80 BP medication side effects: no Past BP meds: amlodipine, benazepril, and HCTZ Duration of hyperlipidemia: years Cholesterol medication side effects: yes Cholesterol supplements: none Past cholesterol medications: atorvastain (lipitor) Medication compliance: excellent compliance Aspirin: no Recent stressonors: no Recurrent headaches: no Visual changes: no Palpitations: no Dyspnea: no Chest pain: no Lower extremity edema: no Dizzy/lightheaded: no  ALCOHOL USE Very seldom drinks alcohol.  Used to have a problem.  Only drinks socially now and that is rare.    COPD Still using Spirva daily.  Very seldom uses Albuerol COPD status: controlled Satisfied with current treatment?: yes Oxygen use: no Dyspnea frequency: when he is active Cough frequency: yes  Rescue inhaler frequency:  very seldom Limitation of activity: yes Productive cough: no Last Spirometry:  Pneumovax: Up to Date Influenza: Up to Date  Depression Screen done today and results listed below:     11/26/2022    8:14 AM 06/25/2022    1:38 PM 01/26/2022    9:52 AM 11/19/2021   10:28 AM 05/15/2021    9:28 AM  Depression screen PHQ 2/9  Decreased Interest 0 1 0 0 0  Down, Depressed, Hopeless 0 0 0 0 0  PHQ - 2 Score 0 1 0 0 0  Altered sleeping 0 1  1 1   Tired, decreased energy 0 0  0 0  Change in appetite 0 0  0  0  Feeling bad or failure about yourself  0 1  0 0  Trouble concentrating 0 0  0 0  Moving slowly or fidgety/restless 0 0  0 0  Suicidal thoughts 0 0  0 0  PHQ-9 Score 0 3  1 1   Difficult doing work/chores Not difficult at all Not difficult at all  Not difficult at all Not difficult at all    The patient does not have a history of falls. I did complete a risk assessment for falls. A plan of care for falls was documented.   Past Medical History:  Past Medical History:  Diagnosis Date   Allergy    Anxiety    not often   Arthritis    fingers, knees   Cancer (HCC)    Cataract    Chronic alcoholism (HCC)    COPD (chronic obstructive pulmonary disease) (HCC)    Depression    pt states sometimes   Dyspnea    Emphysema of lung (HCC)    GERD (gastroesophageal reflux disease)    pt states every now and then   Hypertension    Hypertriglyceridemia     Surgical History:  Past Surgical History:  Procedure Laterality Date   CATARACT EXTRACTION, BILATERAL  2014   COLONOSCOPY  2012   CYSTOSCOPY N/A 01/10/2018   Procedure: CYSTOSCOPY;  Surgeon: Vanna Scotland, MD;  Location: [facility];  Service: Urology;  Laterality: N/A;  RADIOACTIVE SEED IMPLANT N/A 01/10/2018   Procedure: RADIOACTIVE SEED IMPLANT/BRACHYTHERAPY IMPLANT;  Surgeon: Vanna Scotland, MD;  Location: [facility];  Service: Urology;  Laterality: N/A;   TONSILLECTOMY     TONSILLECTOMY AND ADENOIDECTOMY  1952    Medications:  Current Outpatient Medications on File Prior to Visit  Medication Sig   albuterol (VENTOLIN HFA) 108 (90 Base) MCG/ACT inhaler Inhale 2 puffs into the lungs every 6 (six) hours as needed for wheezing or shortness of breath.   ferrous sulfate 325 (65 FE) MG tablet Take 325 mg by mouth daily with breakfast.   Multiple Vitamins-Minerals (CENTRUM SILVER 50+MEN) TABS Take 1 tablet by mouth daily.   Triamcinolone Acetonide (TRIAMCINOLONE 0.1 % CREAM : EUCERIN) CREA Apply 1 Application topically 3 (three) times  daily as needed. Use only on eczematous rash   No current facility-administered medications on file prior to visit.    Allergies:  No Known Allergies  Social History:  Social History   Socioeconomic History   Marital status: Single    Spouse name: Not on file   Number of children: Not on file   Years of education: college   Highest education level: Bachelor's degree (e.g., BA, AB, BS)  Occupational History    Comment: Retired  Tobacco Use   Smoking status: Every Day    Packs/day: 0.50    Years: 57.00    Additional pack years: 0.00    Total pack years: 28.50    Types: Cigarettes   Smokeless tobacco: Never  Vaping Use   Vaping Use: Never used  Substance and Sexual Activity   Alcohol use: Yes    Comment: on occasion   Drug use: No   Sexual activity: Not Currently  Other Topics Concern   Not on file  Social History Narrative   Not on file   Social Determinants of Health   Financial Resource Strain: Low Risk  (01/26/2022)   Overall Financial Resource Strain (CARDIA)    Difficulty of Paying Living Expenses: Not hard at all  Food Insecurity: No Food Insecurity (01/26/2022)   Hunger Vital Sign    Worried About Running Out of Food in the Last Year: Never true    Ran Out of Food in the Last Year: Never true  Transportation Needs: No Transportation Needs (01/26/2022)   PRAPARE - Administrator, Civil Service (Medical): No    Lack of Transportation (Non-Medical): No  Physical Activity: Inactive (01/26/2022)   Exercise Vital Sign    Days of Exercise per Week: 0 days    Minutes of Exercise per Session: 0 min  Stress: No Stress Concern Present (01/26/2022)   [employer] of Occupational Health - Occupational Stress Questionnaire    Feeling of Stress : Not at all  Social Connections: Unknown (01/26/2022)   Social Connection and Isolation Panel [NHANES]    Frequency of Communication with Friends and Family: Never    Frequency of Social Gatherings with Friends  and Family: Never    Attends Religious Services: Never    Database administrator or Organizations: No    Attends Banker Meetings: Never    Marital Status: Not on file  Intimate Partner Violence: Not At Risk (01/26/2022)   Humiliation, Afraid, Rape, and Kick questionnaire    Fear of Current or Ex-Partner: No    Emotionally Abused: No    Physically Abused: No    Sexually Abused: No   Social History   Tobacco Use  Smoking Status Every Day  Packs/day: 0.50   Years: 57.00   Additional pack years: 0.00   Total pack years: 28.50   Types: Cigarettes  Smokeless Tobacco Never   Social History   Substance and Sexual Activity  Alcohol Use Yes   Comment: on occasion    Family History:  Family History  Problem Relation Age of Onset   Arthritis Mother    Heart disease Mother    Hearing loss Mother    Hyperlipidemia Mother    Hypertension Mother    Kidney disease Mother    Miscarriages / [place] Mother    Vision loss Mother    Stroke Father    Heart disease Maternal Grandmother    Stroke Maternal Grandfather    Cancer Paternal Grandfather     Past medical history, surgical history, medications, allergies, family history and social history reviewed with patient today and changes made to appropriate areas of the chart.   Review of Systems  Eyes:  Negative for blurred vision and double vision.  Respiratory:  Negative for shortness of breath.   Cardiovascular:  Negative for chest pain, palpitations and leg swelling.  Neurological:  Negative for dizziness and headaches.   All other ROS negative except what is listed above and in the HPI.      Objective:    BP 136/72   Pulse 65   Temp 98.6 F (37 C) (Oral)   Ht 5' 7.5" (1.715 m)   Wt 160 lb 12.8 oz (72.9 kg)   SpO2 96%   BMI 24.81 kg/m   Wt Readings from Last 3 Encounters:  11/26/22 160 lb 12.8 oz (72.9 kg)  06/25/22 160 lb 9.6 oz (72.8 kg)  03/05/22 158 lb (71.7 kg)    Physical Exam Vitals and  nursing note reviewed.  Constitutional:      General: He is not in acute distress.    Appearance: Normal appearance. He is not ill-appearing, toxic-appearing or diaphoretic.  HENT:     Head: Normocephalic.     Right Ear: Tympanic membrane, ear canal and external ear normal.     Left Ear: Tympanic membrane, ear canal and external ear normal.     Nose: Nose normal. No congestion or rhinorrhea.     Mouth/Throat:     Mouth: Mucous membranes are moist.  Eyes:     General:        Right eye: No discharge.        Left eye: No discharge.     Extraocular Movements: Extraocular movements intact.     Conjunctiva/sclera: Conjunctivae normal.     Pupils: Pupils are equal, round, and reactive to light.  Cardiovascular:     Rate and Rhythm: Normal rate and regular rhythm.     Heart sounds: No murmur heard. Pulmonary:     Effort: Pulmonary effort is normal. No respiratory distress.     Breath sounds: Normal breath sounds. No wheezing, rhonchi or rales.  Abdominal:     General: Abdomen is flat. Bowel sounds are normal. There is no distension.     Palpations: Abdomen is soft.     Tenderness: There is no abdominal tenderness. There is no guarding.  Musculoskeletal:     Cervical back: Normal range of motion and neck supple.  Skin:    General: Skin is warm and dry.     Capillary Refill: Capillary refill takes less than 2 seconds.  Neurological:     General: No focal deficit present.     Mental Status: He is alert and  oriented to person, place, and time.     Cranial Nerves: No cranial nerve deficit.     Motor: No weakness.     Deep Tendon Reflexes: Reflexes normal.  Psychiatric:        Mood and Affect: Mood normal.        Behavior: Behavior normal.        Thought Content: Thought content normal.        Judgment: Judgment normal.     Results for orders placed or performed in visit on 09/04/22  PSA  Result Value Ref Range   Prostate Specific Ag, Serum 0.4 0.0 - 4.0 ng/mL      Assessment &  Plan:   Problem List Items Addressed This Visit       Cardiovascular and Mediastinum   Essential hypertension    Chronic.  Controlled.  Continue with current medication regimen of Benzepril, Amlodipine, and HCTZ.  Refills sent today.  Continue to check blood pressures at home.  Labs ordered today.  Return to clinic in 6 months for reevaluation.  Call sooner if concerns arise.        Relevant Medications   amLODipine (NORVASC) 5 MG tablet   atorvastatin (LIPITOR) 80 MG tablet   fenofibrate micronized (LOFIBRA) 134 MG capsule   hydrochlorothiazide (HYDRODIURIL) 25 MG tablet   benazepril (LOTENSIN) 40 MG tablet   Aortic aneurysm without rupture (HCC)    Chronic.  [facility].   He will have an appointment with Dr. Cliffton Asters surgeon after the CT.  AAA has decreased slightly in size since last imaging.      Relevant Medications   amLODipine (NORVASC) 5 MG tablet   atorvastatin (LIPITOR) 80 MG tablet   fenofibrate micronized (LOFIBRA) 134 MG capsule   hydrochlorothiazide (HYDRODIURIL) 25 MG tablet   benazepril (LOTENSIN) 40 MG tablet   Atrial fibrillation, unspecified type (HCC)    Chronic.  Rate controlled.  Continue with current medication regimen.  Followed by [facility].  Continue to follow up with specialist.  Labs ordered today.  Return to clinic in 6 months for reevaluation.  Call sooner if concerns arise.        Relevant Medications   amLODipine (NORVASC) 5 MG tablet   atorvastatin (LIPITOR) 80 MG tablet   fenofibrate micronized (LOFIBRA) 134 MG capsule   hydrochlorothiazide (HYDRODIURIL) 25 MG tablet   benazepril (LOTENSIN) 40 MG tablet     Respiratory   COPD (chronic obstructive pulmonary disease) (HCC)    Chronic.  Controlled.  Continue with current medication regimen of Spiriva.  Refilled today.  Rarely uses Albuterol.  Labs ordered today.  Return to clinic in 6 months for reevaluation.  Call sooner if concerns arise.        Relevant  Medications   tiotropium (SPIRIVA HANDIHALER) 18 MCG inhalation capsule   Other Visit Diagnoses     Annual physical exam    -  Primary   Health maintenance reviewed during visit today.  Labs ordered.  Vaccines up to date.  Colonoscopy up to date.   Relevant Orders   TSH   Lipid panel   CBC with Differential/Platelet   Comprehensive metabolic panel   Urinalysis, Routine w reflex microscopic   Hypercholesteremia       Relevant Medications   amLODipine (NORVASC) 5 MG tablet   atorvastatin (LIPITOR) 80 MG tablet   fenofibrate micronized (LOFIBRA) 134 MG capsule   hydrochlorothiazide (HYDRODIURIL) 25 MG tablet   benazepril (LOTENSIN) 40 MG tablet  Discussed aspirin prophylaxis for myocardial infarction prevention and decision was made to continue ASA  LABORATORY TESTING:  Health maintenance labs ordered today as discussed above.   The natural history of prostate cancer and ongoing controversy regarding screening and potential treatment outcomes of prostate cancer has been discussed with the patient. The meaning of a false positive PSA and a false negative PSA has been discussed. He indicates understanding of the limitations of this screening test and wishes to proceed with screening PSA testing.   IMMUNIZATIONS:   - Tdap: Tetanus vaccination status reviewed: last tetanus booster within 10 years. - Influenza: Up to date - Pneumovax: Up to date - Prevnar: Up to date - COVID: Not applicable - HPV: Not applicable - Shingrix vaccine: Up to date  SCREENING: - Colonoscopy: Up to date  Discussed with patient purpose of the colonoscopy is to detect colon cancer at curable precancerous or early stages   - AAA Screening: Up to date  -Hearing Test: Not applicable  -Spirometry: Not applicable   PATIENT COUNSELING:    Sexuality: Discussed sexually transmitted diseases, partner selection, use of condoms, avoidance of unintended pregnancy  and contraceptive alternatives.    Advised to avoid cigarette smoking.  I discussed with the patient that most people either abstain from alcohol or drink within safe limits (<=14/week and <=4 drinks/occasion for males, <=7/weeks and <= 3 drinks/occasion for females) and that the risk for alcohol disorders and other health effects rises proportionally with the number of drinks per week and how often a drinker exceeds daily limits.  Discussed cessation/primary prevention of drug use and availability of treatment for abuse.   Diet: Encouraged to adjust caloric intake to maintain  or achieve ideal body weight, to reduce intake of dietary saturated fat and total fat, to limit sodium intake by avoiding high sodium foods and not adding table salt, and to maintain adequate dietary potassium and calcium preferably from fresh fruits, vegetables, and low-fat dairy products.    stressed the importance of regular exercise  Injury prevention: Discussed safety belts, safety helmets, smoke detector, smoking near bedding or upholstery.   Dental health: Discussed importance of regular tooth brushing, flossing, and dental visits.   Follow up plan: NEXT PREVENTATIVE PHYSICAL DUE IN 1 YEAR. Return in about 6 months (around 05/28/2023) for HTN, HLD, DM2 FU.

## 2022-11-26 NOTE — Assessment & Plan Note (Signed)
Chronic.  [facility].   He will have an appointment with Dr. Cliffton Asters surgeon after the CT.  AAA has decreased slightly in size since last imaging.

## 2022-11-26 NOTE — Assessment & Plan Note (Signed)
Chronic.  Controlled.  Continue with current medication regimen of Benzepril, Amlodipine, and HCTZ.  Refills sent today.  Continue to check blood pressures at home.  Labs ordered today.  Return to clinic in 6 months for reevaluation.  Call sooner if concerns arise.

## 2022-11-27 LAB — CBC WITH DIFFERENTIAL/PLATELET
Basophils Absolute: 0.1 10*3/uL (ref 0.0–0.2)
Basos: 1 %
EOS (ABSOLUTE): 0.2 10*3/uL (ref 0.0–0.4)
Eos: 2 %
Hematocrit: 42 % (ref 37.5–51.0)
Hemoglobin: 13.9 g/dL (ref 13.0–17.7)
Immature Grans (Abs): 0 10*3/uL (ref 0.0–0.1)
Immature Granulocytes: 1 %
Lymphocytes Absolute: 1.7 10*3/uL (ref 0.7–3.1)
Lymphs: 27 %
MCH: 31.5 pg (ref 26.6–33.0)
MCHC: 33.1 g/dL (ref 31.5–35.7)
MCV: 95 fL (ref 79–97)
Monocytes Absolute: 0.6 10*3/uL (ref 0.1–0.9)
Monocytes: 9 %
Neutrophils Absolute: 3.7 10*3/uL (ref 1.4–7.0)
Neutrophils: 60 %
Platelets: 187 10*3/uL (ref 150–450)
RBC: 4.41 x10E6/uL (ref 4.14–5.80)
RDW: 12.8 % (ref 11.6–15.4)
WBC: 6.2 10*3/uL (ref 3.4–10.8)

## 2022-11-27 LAB — COMPREHENSIVE METABOLIC PANEL
ALT: 11 IU/L (ref 0–44)
AST: 15 IU/L (ref 0–40)
Albumin: 4.5 g/dL (ref 3.8–4.8)
Alkaline Phosphatase: 62 IU/L (ref 44–121)
BUN/Creatinine Ratio: 12 (ref 10–24)
BUN: 12 mg/dL (ref 8–27)
Bilirubin Total: 0.5 mg/dL (ref 0.0–1.2)
CO2: 24 mmol/L (ref 20–29)
Calcium: 9.8 mg/dL (ref 8.6–10.2)
Chloride: 105 mmol/L (ref 96–106)
Creatinine, Ser: 0.99 mg/dL (ref 0.76–1.27)
Globulin, Total: 2.4 g/dL (ref 1.5–4.5)
Glucose: 99 mg/dL (ref 70–99)
Potassium: 4.3 mmol/L (ref 3.5–5.2)
Sodium: 143 mmol/L (ref 134–144)
Total Protein: 6.9 g/dL (ref 6.0–8.5)
eGFR: 79 mL/min/{1.73_m2} (ref >59)

## 2022-11-27 LAB — LIPID PANEL
Chol/HDL Ratio: 3.8 ratio (ref 0.0–5.0)
Cholesterol, Total: 124 mg/dL (ref 100–199)
HDL: 33 mg/dL — ABNORMAL LOW (ref >39)
LDL Chol Calc (NIH): 56 mg/dL (ref 0–99)
Triglycerides: 218 mg/dL — ABNORMAL HIGH (ref 0–149)
VLDL Cholesterol Cal: 35 mg/dL (ref 5–40)

## 2022-11-27 LAB — TSH: TSH: 2.62 u[IU]/mL (ref 0.450–4.500)

## 2022-11-27 NOTE — Progress Notes (Signed)
Hi Carl Allen. It was nice to see you yesterday.  Your lab work looks good.  No concerns at this time. Continue with your current medication regimen.  Follow up as discussed.  Please let me know if you have any questions.

## 2023-02-02 ENCOUNTER — Ambulatory Visit (INDEPENDENT_AMBULATORY_CARE_PROVIDER_SITE_OTHER): Payer: PPO | Admitting: Emergency Medicine

## 2023-02-02 VITALS — BP 138/74 | HR 74 | Ht 68.0 in | Wt 162.4 lb

## 2023-02-02 DIAGNOSIS — Z0000 Encounter for general adult medical examination without abnormal findings: Secondary | ICD-10-CM | POA: Diagnosis not present

## 2023-02-02 NOTE — Patient Instructions (Signed)
Carl Allen , Thank you for taking time to come for your Medicare Wellness Visit. I appreciate your ongoing commitment to your health goals. Please review the following plan we discussed and let me know if I can assist you in the future.   Referrals/Orders/Follow-Ups/Clinician Recommendations: Get the flu, RSV and Covid vaccines in the fall.  This is a list of the screening recommended for you and due dates:  Health Maintenance  Topic Date Due   Flu Shot  12/31/2022   COVID-19 Vaccine (6 - 2023-24 season) 01/31/2023   Screening for Lung Cancer  07/24/2023   Medicare Annual Wellness Visit  02/02/2024   DTaP/Tdap/Td vaccine (4 - Td or Tdap) 11/26/2031   Pneumonia Vaccine  Completed   Hepatitis C Screening  Completed   Zoster (Shingles) Vaccine  Completed   HPV Vaccine  Aged Out   Colon Cancer Screening  Discontinued    Advanced directives: (Copy Requested) Please bring a copy of your health care power of attorney and living will to the office to be added to your chart at your convenience.  Next Medicare Annual Wellness Visit scheduled for next year: Yes, 02/08/24 @ 9:45am

## 2023-02-02 NOTE — Progress Notes (Signed)
Subjective:   Carl Allen is a 77 y.o. male who presents for Medicare Annual/Subsequent preventive examination.  Visit Complete: In person   Review of Systems     Cardiac Risk Factors include: advanced age (>25men, >3 women);male gender;hypertension;smoking/ tobacco exposure;dyslipidemia     Objective:    Today's Vitals   02/02/23 0954  BP: 138/74  Pulse: 74  SpO2: 98%  Weight: 162 lb 6.4 oz (73.7 kg)  Height: 5\' 8"  (1.727 m)   Body mass index is 24.69 kg/m.     02/02/2023   10:09 AM 01/26/2022    9:40 AM 09/18/2021   10:47 AM 01/24/2021    9:51 AM 08/31/2019   11:23 AM 02/16/2019   10:51 AM 02/01/2019    9:48 AM  Advanced Directives  Does Patient Have a Medical Advance Directive? Yes No No No No No No  Type of Advance Directive Healthcare Power of Attorney        Does patient want to make changes to medical advance directive? No - Patient declined        Copy of Healthcare Power of Attorney in Chart? No - copy requested        Would patient like information on creating a medical advance directive?  No - Patient declined No - Patient declined  No - Patient declined No - Patient declined     Current Medications (verified) Outpatient Encounter Medications as of 02/02/2023  Medication Sig   albuterol (VENTOLIN HFA) 108 (90 Base) MCG/ACT inhaler Inhale 2 puffs into the lungs every 6 (six) hours as needed for wheezing or shortness of breath.   amLODipine (NORVASC) 5 MG tablet Take 1 tablet (5 mg total) by mouth daily.   atorvastatin (LIPITOR) 80 MG tablet Take 1 tablet (80 mg total) by mouth daily.   benazepril (LOTENSIN) 40 MG tablet Take 1 tablet (40 mg total) by mouth daily.   fenofibrate micronized (LOFIBRA) 134 MG capsule Take 1 capsule (134 mg total) by mouth daily before breakfast.   ferrous sulfate 325 (65 FE) MG tablet Take 325 mg by mouth daily with breakfast.   hydrochlorothiazide (HYDRODIURIL) 25 MG tablet Take 1 tablet (25 mg total) by mouth daily.   Multiple  Vitamins-Minerals (CENTRUM SILVER 50+MEN) TABS Take 1 tablet by mouth daily.   tiotropium (SPIRIVA HANDIHALER) 18 MCG inhalation capsule Place 1 capsule (18 mcg total) into inhaler and inhale every morning.   Triamcinolone Acetonide (TRIAMCINOLONE 0.1 % CREAM : EUCERIN) CREA Apply 1 Application topically 3 (three) times daily as needed. Use only on eczematous rash   No facility-administered encounter medications on file as of 02/02/2023.    Allergies (verified) Patient has no known allergies.   History: Past Medical History:  Diagnosis Date   Allergy    Anxiety    not often   Arthritis    fingers, knees   Cancer (HCC)    Cataract    Chronic alcoholism (HCC)    COPD (chronic obstructive pulmonary disease) (HCC)    Depression    pt states sometimes   Dyspnea    Emphysema of lung (HCC)    GERD (gastroesophageal reflux disease)    pt states every now and then   Hypertension    Hypertriglyceridemia    Past Surgical History:  Procedure Laterality Date   CATARACT EXTRACTION, BILATERAL  2014   COLONOSCOPY  2012   CYSTOSCOPY N/A 01/10/2018   Procedure: CYSTOSCOPY;  Surgeon: Vanna Scotland, MD;  Location: [facility] ORS;  Service: Urology;  Laterality: N/A;   RADIOACTIVE SEED IMPLANT N/A 01/10/2018   Procedure: RADIOACTIVE SEED IMPLANT/BRACHYTHERAPY IMPLANT;  Surgeon: Vanna Scotland, MD;  Location: [facility];  Service: Urology;  Laterality: N/A;   TONSILLECTOMY     TONSILLECTOMY AND ADENOIDECTOMY  1952   Family History  Problem Relation Age of Onset   Arthritis Mother    Heart disease Mother    Hearing loss Mother    Hyperlipidemia Mother    Hypertension Mother    Kidney disease Mother    Miscarriages / [place] Mother    Vision loss Mother    Stroke Father    Heart disease Maternal Grandmother    Stroke Maternal Grandfather    Cancer Paternal Grandfather    Social History   Socioeconomic History   Marital status: Single    Spouse name: Not on file   Number of children:  Not on file   Years of education: college   Highest education level: Bachelor's degree (e.g., BA, AB, BS)  Occupational History    Comment: Retired  Tobacco Use   Smoking status: Every Day    Current packs/day: 0.50    Average packs/day: 0.5 packs/day for 59.7 years (29.8 ttl pk-yrs)    Types: Cigarettes    Start date: 1965   Smokeless tobacco: Never  Vaping Use   Vaping status: Never Used  Substance and Sexual Activity   Alcohol use: Yes    Comment: 1 cocktail 2-3 months   Drug use: No   Sexual activity: Not Currently  Other Topics Concern   Not on file  Social History Narrative   Never been married, no children   Social Determinants of Health   Financial Resource Strain: Low Risk  (02/02/2023)   Overall Financial Resource Strain (CARDIA)    Difficulty of Paying Living Expenses: Not hard at all  Food Insecurity: No Food Insecurity (02/02/2023)   Hunger Vital Sign    Worried About Running Out of Food in the Last Year: Never true    Ran Out of Food in the Last Year: Never true  Transportation Needs: No Transportation Needs (02/02/2023)   PRAPARE - Administrator, Civil Service (Medical): No    Lack of Transportation (Non-Medical): No  Physical Activity: Insufficiently Active (02/02/2023)   Exercise Vital Sign    Days of Exercise per Week: 2 days    Minutes of Exercise per Session: 20 min  Stress: No Stress Concern Present (02/02/2023)   [employer] of Occupational Health - Occupational Stress Questionnaire    Feeling of Stress : Not at all  Social Connections: Socially Isolated (02/02/2023)   Social Connection and Isolation Panel [NHANES]    Frequency of Communication with Friends and Family: Once a week    Frequency of Social Gatherings with Friends and Family: Once a week    Attends Religious Services: Never    Database administrator or Organizations: No    Attends Engineer, structural: Never    Marital Status: Never married    Tobacco  Counseling Ready to quit: Not Answered Counseling given: Not Answered   Clinical Intake:  Pre-visit preparation completed: Yes  Pain : No/denies pain     BMI - recorded: 24.69 Nutritional Status: BMI of 19-24  Normal Nutritional Risks: None Diabetes: No  How often do you need to have someone help you when you read instructions, pamphlets, or other written materials from your doctor or pharmacy?: 1 - Never  Interpreter Needed?: No  Information entered by ::  Tora Kindred, CMA   Activities of Daily Living    02/02/2023    9:56 AM  In your present state of health, do you have any difficulty performing the following activities:  Hearing? 0  Vision? 0  Difficulty concentrating or making decisions? 0  Walking or climbing stairs? 0  Dressing or bathing? 0  Doing errands, shopping? 0  Preparing Food and eating ? N  Using the Toilet? N  In the past six months, have you accidently leaked urine? N  Do you have problems with loss of bowel control? N  Managing your Medications? N  Managing your Finances? N  Housekeeping or managing your Housekeeping? N    Patient Care Team: Larae Grooms, NP as PCP - General (Nurse Practitioner) Mertie Moores, MD as Referring Physician (Specialist) Dalia Heading, MD as Consulting Physician (Cardiology) Carmina Miller, MD as Referring Physician (Radiation Oncology) Vanna Scotland, MD as Consulting Physician (Urology) Marlowe Sax, RN as Case Manager (General Practice) Carmina Miller, MD as Radiation Oncologist (Radiation Oncology) Kemper Durie, RN as [facility] Care Management  Indicate any recent Medical Services you may have received from other than [facility] providers in the past year (date may be approximate).     Assessment:   This is a routine wellness examination for [place].  Hearing/Vision screen Hearing Screening - Comments:: Denies hearing loss Vision Screening - Comments:: Gets eye exams  Dietary  issues and exercise activities discussed:     Goals Addressed               This Visit's Progress     Patient Stated (pt-stated)        Lose 10 lbs ~155lbs      Depression Screen    02/02/2023   10:05 AM 11/26/2022    8:14 AM 06/25/2022    1:38 PM 01/26/2022    9:52 AM 11/19/2021   10:28 AM 05/15/2021    9:28 AM 02/10/2021   11:09 AM  PHQ 2/9 Scores  PHQ - 2 Score 0 0 1 0 0 0 0  PHQ- 9 Score 0 0 3  1 1  0    Fall Risk    02/02/2023   10:10 AM 11/26/2022    8:13 AM 06/25/2022    1:37 PM 01/26/2022    9:41 AM 11/19/2021   10:27 AM  Fall Risk   Falls in the past year? 0 0 0 0 0  Number falls in past yr: 0 0 0 0 0  Injury with Fall? 0 0 0 0 0  Risk for fall due to : No Fall Risks No Fall Risks No Fall Risks  No Fall Risks  Follow up Falls prevention discussed Falls evaluation completed Falls evaluation completed Falls evaluation completed;Education provided;Falls prevention discussed Falls evaluation completed    MEDICARE RISK AT HOME: Medicare Risk at Home Any stairs in or around the home?: Yes If so, are there any without handrails?: No Home free of loose throw rugs in walkways, pet beds, electrical cords, etc?: Yes Adequate lighting in your home to reduce risk of falls?: Yes Life alert?: No Use of a cane, walker or w/c?: No Grab bars in the bathroom?: Yes Shower chair or bench in shower?: No Elevated toilet seat or a handicapped toilet?: Yes  TIMED UP AND GO:  Was the test performed?  Yes  Length of time to ambulate 10 feet: 10sec sec Gait steady and fast without use of assistive device    Cognitive Function:  02/02/2023   10:11 AM 01/26/2022    9:44 AM 01/24/2021    9:56 AM 01/26/2018    9:14 AM 01/06/2017    9:58 AM  6CIT Screen  What Year? 0 points 0 points 0 points 0 points 0 points  What month? 0 points 0 points 0 points 0 points 0 points  What time? 0 points 0 points 0 points 0 points 0 points  Count back from 20 0 points 0 points 0 points 0 points  0 points  Months in reverse 0 points 0 points 0 points 0 points 0 points  Repeat phrase 0 points 0 points 0 points 0 points 0 points  Total Score 0 points 0 points 0 points 0 points 0 points    Immunizations Immunization History  Administered Date(s) Administered   Fluad Quad(high Dose 65+) 03/07/2019, 03/12/2020, 02/10/2021, 06/25/2022   Influenza, High Dose Seasonal PF 04/07/2017, 02/02/2018   Influenza-Unspecified 02/09/2015, 02/24/2016   PFIZER(Purple Top)SARS-COV-2 Vaccination 07/12/2019, 08/02/2019, 02/29/2020, 10/04/2020, 02/16/2021   Pneumococcal Conjugate-13 12/21/2013   Pneumococcal Polysaccharide-23 03/22/2008, 02/13/2013   Td 09/20/2001   Tdap 01/08/2016, 11/25/2021   Zoster Recombinant(Shingrix) 03/18/2021, 06/12/2021   Zoster, Live 12/18/2010    TDAP status: Up to date  Flu Vaccine status: Due, Education has been provided regarding the importance of this vaccine. Advised may receive this vaccine at local pharmacy or Health Dept. Aware to provide a copy of the vaccination record if obtained from local pharmacy or Health Dept. Verbalized acceptance and understanding.  Pneumococcal vaccine status: Up to date  Covid-19 vaccine status: Information provided on how to obtain vaccines.   Qualifies for Shingles Vaccine? Yes   Zostavax completed Yes   Shingrix Completed?: Yes  Screening Tests Health Maintenance  Topic Date Due   INFLUENZA VACCINE  12/31/2022   COVID-19 Vaccine (6 - 2023-24 season) 01/31/2023   Lung Cancer Screening  07/24/2023   Medicare Annual Wellness (AWV)  02/02/2024   DTaP/Tdap/Td (4 - Td or Tdap) 11/26/2031   Pneumonia Vaccine 17+ Years old  Completed   Hepatitis C Screening  Completed   Zoster Vaccines- Shingrix  Completed   HPV VACCINES  Aged Out   Colonoscopy  Discontinued    Health Maintenance  Health Maintenance Due  Topic Date Due   INFLUENZA VACCINE  12/31/2022   COVID-19 Vaccine (6 - 2023-24 season) 01/31/2023    Colorectal  cancer screening: No longer required.   Lung Cancer Screening: (Low Dose CT Chest recommended if Age 29-80 years, 20 pack-year currently smoking OR have quit w/in 15years.) does qualify.   Lung Cancer Screening Referral: will be placed by cardiologist  Additional Screening:  Hepatitis C Screening: does not qualify; Completed 01/08/16  Vision Screening: Recommended annual ophthalmology exams for early detection of glaucoma and other disorders of the eye.  Dental Screening: Recommended annual dental exams for proper oral hygiene   Community Resource Referral / Chronic Care Management: CRR required this visit?  No   CCM required this visit?  No     Plan:     I have personally reviewed and noted the following in the patient's chart:   Medical and social history Use of alcohol, tobacco or illicit drugs  Current medications and supplements including opioid prescriptions. Patient is not currently taking opioid prescriptions. Functional ability and status Nutritional status Physical activity Advanced directives List of other physicians Hospitalizations, surgeries, and ER visits in previous 12 months Vitals Screenings to include cognitive, depression, and falls Referrals and appointments  In addition, I  have reviewed and discussed with patient certain preventive protocols, quality metrics, and best practice recommendations. A written personalized care plan for preventive services as well as general preventive health recommendations were provided to patient.     Tora Kindred, CMA   02/02/2023   After Visit Summary: To be mailed to patient.  Nurse Notes:  Will get flu shot in the fall and covid when available. Recommended RSV vaccine

## 2023-03-05 ENCOUNTER — Other Ambulatory Visit: Payer: Self-pay | Admitting: *Deleted

## 2023-03-05 ENCOUNTER — Other Ambulatory Visit: Payer: PPO

## 2023-03-05 DIAGNOSIS — C61 Malignant neoplasm of prostate: Secondary | ICD-10-CM

## 2023-03-06 LAB — PSA: Prostate Specific Ag, Serum: 0.5 ng/mL (ref 0.0–4.0)

## 2023-03-08 NOTE — Progress Notes (Unsigned)
03/09/23 10:43 AM   Carl Allen 28-Sep-1945 [id number]  Referring provider:  Larae Grooms, NP [address] [place],  [place] [address]  Urological history: 1.  Prostate cancer - PSA (03/2023) 0.5 -intermediate risk prostate cancer  -initially diagnosed with low risk prostate cancer in 2018 -repeat biopsy which showed a more aggressive, Gleason 3+4 disease and 2 of the cores -brachytherapy seeds in 12/2017    2. BPH with LU TS  Chief Complaint  Patient presents with   Prostate Cancer    HPI: Carl Allen is a 77 y.o.male who presents today for 6 month follow-up.  Previous records reviewed.   Previous record reviewed.  Up-to-date with wellness visits.  Sees cardiology for PAF.    He admits urinary frequency, but he states he has been that way his whole life.  Patient denies any modifying or aggravating factors.  Patient denies any recent UTI's, gross hematuria, dysuria or suprapubic/flank pain.  Patient denies any fevers, chills, nausea or vomiting.      IPSS     Row Name 03/09/23 1000         International Prostate Symptom Score   How often have you had the sensation of not emptying your bladder? Less than 1 in 5     How often have you had to urinate less than every two hours? About half the time     How often have you found you stopped and started again several times when you urinated? Less than 1 in 5 times     How often have you found it difficult to postpone urination? Less than 1 in 5 times     How often have you had a weak urinary stream? Not at All     How often have you had to strain to start urination? Not at All     How many times did you typically get up at night to urinate? None     Total IPSS Score 6       Quality of Life due to urinary symptoms   If you were to spend the rest of your life with your urinary condition just the way it is now how would you feel about that? Mostly Satisfied                Score:  1-7 Mild 8-19  Moderate 20-35 Severe    PMH: Past Medical History:  Diagnosis Date   Allergy    Anxiety    not often   Arthritis    fingers, knees   Cancer (HCC)    Cataract    Chronic alcoholism (HCC)    COPD (chronic obstructive pulmonary disease) (HCC)    Depression    pt states sometimes   Dyspnea    Emphysema of lung (HCC)    GERD (gastroesophageal reflux disease)    pt states every now and then   Hypertension    Hypertriglyceridemia     Surgical History: Past Surgical History:  Procedure Laterality Date   CATARACT EXTRACTION, BILATERAL  2014   COLONOSCOPY  2012   CYSTOSCOPY N/A 01/10/2018   Procedure: CYSTOSCOPY;  Surgeon: Vanna Scotland, MD;  Location: [facility] ORS;  Service: Urology;  Laterality: N/A;   RADIOACTIVE SEED IMPLANT N/A 01/10/2018   Procedure: RADIOACTIVE SEED IMPLANT/BRACHYTHERAPY IMPLANT;  Surgeon: Vanna Scotland, MD;  Location: [facility] OR[facility];  Service: Urology;  Laterality: N/A;   TONSILLECTOMY     TONSILLECTOMY AND ADENOIDECTOMY  1952    Home Medications:  Allergies  as of 03/09/2023   No Known Allergies      Medication List        Accurate as of March 09, 2023 10:43 AM. If you have any questions, ask your nurse or doctor.          albuterol 108 (90 Base) MCG/ACT inhaler Commonly known as: VENTOLIN HFA Inhale 2 puffs into the lungs every 6 (six) hours as needed for wheezing or shortness of breath.   amLODipine 5 MG tablet Commonly known as: NORVASC Take 1 tablet (5 mg total) by mouth daily.   atorvastatin 80 MG tablet Commonly known as: LIPITOR Take 1 tablet (80 mg total) by mouth daily.   benazepril 40 MG tablet Commonly known as: LOTENSIN Take 1 tablet (40 mg total) by mouth daily.   Centrum Silver 50+Men Tabs Take 1 tablet by mouth daily.   fenofibrate micronized 134 MG capsule Commonly known as: LOFIBRA Take 1 capsule (134 mg total) by mouth daily before breakfast.   ferrous sulfate 325 (65 FE) MG tablet Take 325 mg by mouth daily  with breakfast.   hydrochlorothiazide 25 MG tablet Commonly known as: HYDRODIURIL Take 1 tablet (25 mg total) by mouth daily.   tiotropium 18 MCG inhalation capsule Commonly known as: Spiriva HandiHaler Place 1 capsule (18 mcg total) into inhaler and inhale every morning.   triamcinolone 0.1 % cream : eucerin Crea Apply 1 Application topically 3 (three) times daily as needed. Use only on eczematous rash        Allergies:  No Known Allergies  Family History: Family History  Problem Relation Age of Onset   Arthritis Mother    Heart disease Mother    Hearing loss Mother    Hyperlipidemia Mother    Hypertension Mother    Kidney disease Mother    Miscarriages / [place] Mother    Vision loss Mother    Stroke Father    Heart disease Maternal Grandmother    Stroke Maternal Grandfather    Cancer Paternal Grandfather     Social History:  reports that he has been smoking cigarettes. He started smoking about 59 years ago. He has a 29.9 pack-year smoking history. He has never used smokeless tobacco. He reports current alcohol use. He reports that he does not use drugs.   Physical Exam: BP (!) 151/83   Pulse 75   Ht 5\' 8"  (1.727 m)   Wt 160 lb (72.6 kg)   BMI 24.33 kg/m   Constitutional:  Well nourished. Alert and oriented, No acute distress. HEENT: [place] AT, moist mucus membranes.  Trachea midline Cardiovascular: No clubbing, cyanosis, or edema. Respiratory: Normal respiratory effort, no increased work of breathing. Neurologic: Grossly intact, no focal deficits, moving all 4 extremities. Psychiatric: Normal mood and affect.   Laboratory Data:    Latest Ref Rng & Units 11/26/2022    8:31 AM 06/25/2022    2:57 PM 11/20/2021    8:37 AM  CMP  Glucose 70 - 99 mg/dL 99  81  94   BUN 8 - 27 mg/dL 12  16  14    Creatinine 0.76 - 1.27 mg/dL 1.61  0.96  0.45   Sodium 134 - 144 mmol/L 143  144  139   Potassium 3.5 - 5.2 mmol/L 4.3  4.4  4.2   Chloride 96 - 106 mmol/L 105  106   100   CO2 20 - 29 mmol/L 24  25  26    Calcium 8.6 - 10.2 mg/dL 9.8  9.6  9.4   Total Protein 6.0 - 8.5 g/dL 6.9  6.8  7.0   Total Bilirubin 0.0 - 1.2 mg/dL 0.5  0.3  0.6   Alkaline Phos 44 - 121 IU/L 62  63  68   AST 0 - 40 IU/L 15  13  14    ALT 0 - 44 IU/L 11  11  11         Latest Ref Rng & Units 11/26/2022    8:31 AM 06/25/2022    2:57 PM 11/20/2021    8:37 AM  CBC  WBC 3.4 - 10.8 x10E3/uL 6.2  6.4  6.9   Hemoglobin 13.0 - 17.7 g/dL 56.4  33.2  95.1   Hematocrit 37.5 - 51.0 % 42.0  41.2  42.3   Platelets 150 - 450 x10E3/uL 187  229  187     Component     Latest Ref Rng 03/05/2023  Prostate Specific Ag, Serum     0.0 - 4.0 ng/mL 0.5    Component     Latest Ref Rng 11/26/2022  Cholesterol, Total     100 - 199 mg/dL 884   Triglycerides     0 - 149 mg/dL 166 (H)   HDL Cholesterol     >39 mg/dL 33 (L)   VLDL Cholesterol Cal     5 - 40 mg/dL 35   LDL Chol Calc (NIH)     0 - 99 mg/dL 56   Total CHOL/HDL Ratio     0.0 - 5.0 ratio 3.8     Legend: (H) High (L) Low  Component     Latest Ref Rng 11/26/2022  TSH     0.450 - 4.500 uIU/mL 2.620     Component     Latest Ref Rng 11/26/2022  Specific Gravity, UA     1.005 - 1.030  1.015   pH, UA     5.0 - 7.5  7.5   Color, UA     Yellow  Yellow   Appearance Ur     Clear  Clear   Leukocytes,UA     Negative  Negative   Protein,UA     Negative/Trace  Negative   Glucose, UA     Negative  Negative   Ketones, UA     Negative  Negative   RBC, UA     Negative  Negative   Bilirubin, UA     Negative  Negative   Urobilinogen, Ur     0.2 - 1.0 mg/dL 0.2   Nitrite, UA     Negative  Negative   Microscopic Examination Comment   I have reviewed the labs.   Pertinent Imaging N/A    Assessment & Plan:   1. Prostate cancer - PSA remains at nadir  2. BPH with LU TS - mild, no bothersome symptoms  -continue conservative management, avoiding bladder irritants and timed voiding's  Return for PSA in 6 months, PSA, I PSS  and office visit in one year .     Cloretta Ned   [facility] [address], [address] [place], [place] [address] [phone]

## 2023-03-09 ENCOUNTER — Ambulatory Visit: Payer: PPO | Admitting: Urology

## 2023-03-09 ENCOUNTER — Encounter: Payer: Self-pay | Admitting: Urology

## 2023-03-09 VITALS — BP 151/83 | HR 75 | Ht 68.0 in | Wt 160.0 lb

## 2023-03-09 DIAGNOSIS — C61 Malignant neoplasm of prostate: Secondary | ICD-10-CM | POA: Diagnosis not present

## 2023-03-09 DIAGNOSIS — N401 Enlarged prostate with lower urinary tract symptoms: Secondary | ICD-10-CM

## 2023-05-28 ENCOUNTER — Ambulatory Visit: Payer: PPO | Admitting: Nurse Practitioner

## 2023-06-09 ENCOUNTER — Other Ambulatory Visit: Payer: Self-pay | Admitting: Thoracic Surgery (Cardiothoracic Vascular Surgery)

## 2023-06-09 DIAGNOSIS — I7121 Aneurysm of the ascending aorta, without rupture: Secondary | ICD-10-CM

## 2023-06-15 ENCOUNTER — Ambulatory Visit (INDEPENDENT_AMBULATORY_CARE_PROVIDER_SITE_OTHER): Payer: PPO | Admitting: Nurse Practitioner

## 2023-06-15 ENCOUNTER — Encounter: Payer: Self-pay | Admitting: Nurse Practitioner

## 2023-06-15 VITALS — BP 137/78 | HR 75 | Ht 68.0 in | Wt 164.6 lb

## 2023-06-15 DIAGNOSIS — E7800 Pure hypercholesterolemia, unspecified: Secondary | ICD-10-CM

## 2023-06-15 DIAGNOSIS — I719 Aortic aneurysm of unspecified site, without rupture: Secondary | ICD-10-CM

## 2023-06-15 DIAGNOSIS — F1020 Alcohol dependence, uncomplicated: Secondary | ICD-10-CM | POA: Diagnosis not present

## 2023-06-15 DIAGNOSIS — Z23 Encounter for immunization: Secondary | ICD-10-CM | POA: Diagnosis not present

## 2023-06-15 DIAGNOSIS — I4891 Unspecified atrial fibrillation: Secondary | ICD-10-CM

## 2023-06-15 DIAGNOSIS — J410 Simple chronic bronchitis: Secondary | ICD-10-CM | POA: Diagnosis not present

## 2023-06-15 DIAGNOSIS — C61 Malignant neoplasm of prostate: Secondary | ICD-10-CM | POA: Diagnosis not present

## 2023-06-15 DIAGNOSIS — I10 Essential (primary) hypertension: Secondary | ICD-10-CM | POA: Diagnosis not present

## 2023-06-15 DIAGNOSIS — E782 Mixed hyperlipidemia: Secondary | ICD-10-CM

## 2023-06-15 MED ORDER — HYDROCHLOROTHIAZIDE 25 MG PO TABS: 25.0000 mg | ORAL_TABLET | Freq: Every day | ORAL | 1 refills | Status: DC

## 2023-06-15 MED ORDER — AMLODIPINE BESYLATE 5 MG PO TABS
5.0000 mg | ORAL_TABLET | Freq: Every day | ORAL | 1 refills | Status: DC
Start: 2023-06-15 — End: 2023-12-16

## 2023-06-15 MED ORDER — ATORVASTATIN CALCIUM 80 MG PO TABS: 80.0000 mg | ORAL_TABLET | Freq: Every day | ORAL | 1 refills | Status: DC

## 2023-06-15 MED ORDER — FENOFIBRATE MICRONIZED 134 MG PO CAPS: 134.0000 mg | ORAL_CAPSULE | Freq: Every day | ORAL | 1 refills | Status: DC

## 2023-06-15 MED ORDER — BENAZEPRIL HCL 40 MG PO TABS: 40.0000 mg | ORAL_TABLET | Freq: Every day | ORAL | 1 refills | Status: DC

## 2023-06-15 MED ORDER — TIOTROPIUM BROMIDE MONOHYDRATE 18 MCG IN CAPS: 18.0000 ug | ORAL_CAPSULE | Freq: Every morning | RESPIRATORY_TRACT | 1 refills | Status: DC

## 2023-06-15 NOTE — Assessment & Plan Note (Signed)
Chronic.  Controlled.  Continue with current medication regimen of Benzepril, Amlodipine, and HCTZ.  Refills sent today.  Continue to check blood pressures at home.  Labs ordered today.  Return to clinic in 6 months for reevaluation.  Call sooner if concerns arise.

## 2023-06-15 NOTE — Assessment & Plan Note (Signed)
Chronic.  Controlled.  Continue with current medication regimen of Atorvastatin daily.  Refills sent today.  Labs ordered today.  Return to clinic in 6 months for reevaluation.  Call sooner if concerns arise.    

## 2023-06-15 NOTE — Assessment & Plan Note (Signed)
Chronic.  Controlled.  Continue with current medication regimen of Spiriva.  Refilled today.  Rarely uses Albuterol.  Labs ordered today.  Return to clinic in 6 months for reevaluation.  Call sooner if concerns arise.

## 2023-06-15 NOTE — Assessment & Plan Note (Signed)
Chronic.  Rate controlled.  Continue with current medication regimen.  Followed by [facility].  Continue to follow up with specialist.  Labs ordered today.  Return to clinic in 6 months for reevaluation.  Call sooner if concerns arise.

## 2023-06-15 NOTE — Patient Instructions (Signed)
Omeprazole 20 mg 

## 2023-06-15 NOTE — Progress Notes (Signed)
 BP 137/78 (BP Location: Right Arm, Patient Position: Sitting, Cuff Size: Large)   Pulse 75   Ht 5' 8 (1.727 m)   Wt 164 lb 9.6 oz (74.7 kg)   SpO2 98%   BMI 25.03 kg/m    Subjective:    Patient ID: Carl Allen, male    DOB: 1946/02/14, 78 y.o.   MRN: [id number]  HPI: Carl Allen is a 78 y.o. male  Chief Complaint  Patient presents with   6 month follow up   Hyperlipidemia   Hypertension   Medication Refill    All medication    HYPERTENSION / HYPERLIPIDEMIA Follows up with CT Surgery.  He has an Angiogram done annually. Satisfied with current treatment? yes Duration of hypertension: years BP monitoring frequency: sometimes BP range: 120-130/70-80 BP medication side effects: no Past BP meds: amlodipine , benazepril , and HCTZ Duration of hyperlipidemia: years Cholesterol medication side effects: yes Cholesterol supplements: none Past cholesterol medications: atorvastain (lipitor) Medication compliance: excellent compliance Aspirin: no Recent stressonors: no Recurrent headaches: no Visual changes: no Palpitations: no Dyspnea: yes Chest pain: no Lower extremity edema: no Dizzy/lightheaded: no  ALCOHOL USE Very seldom drinks alcohol.  Used to have a problem.  Only drinks socially now and that is rare.    COPD Still using Spirva daily.  Very seldom uses Albuterol .  Feels like his breathing is pretty good.  COPD status: controlled Satisfied with current treatment?: yes Oxygen  use: no Dyspnea frequency: when he is active Cough frequency: yes  Rescue inhaler frequency:  very seldom Limitation of activity: yes Productive cough: no Last Spirometry:  Pneumovax: Up to Date Influenza: Up to Date   Relevant past medical, surgical, family and social history reviewed and updated as indicated. Interim medical history since our last visit reviewed. Allergies and medications reviewed and updated.  Review of Systems  Eyes:  Negative for visual disturbance.   Respiratory:  Positive for shortness of breath. Negative for chest tightness.   Cardiovascular:  Negative for chest pain, palpitations and leg swelling.  Neurological:  Negative for dizziness, light-headedness and headaches.    Per HPI unless specifically indicated above     Objective:    BP 137/78 (BP Location: Right Arm, Patient Position: Sitting, Cuff Size: Large)   Pulse 75   Ht 5' 8 (1.727 m)   Wt 164 lb 9.6 oz (74.7 kg)   SpO2 98%   BMI 25.03 kg/m   Wt Readings from Last 3 Encounters:  06/15/23 164 lb 9.6 oz (74.7 kg)  03/09/23 160 lb (72.6 kg)  02/02/23 162 lb 6.4 oz (73.7 kg)    Physical Exam Vitals and nursing note reviewed.  Constitutional:      General: He is not in acute distress.    Appearance: Normal appearance. He is not ill-appearing, toxic-appearing or diaphoretic.  HENT:     Head: Normocephalic.     Right Ear: External ear normal.     Left Ear: External ear normal.     Nose: Nose normal. No congestion or rhinorrhea.     Mouth/Throat:     Mouth: Mucous membranes are moist.  Eyes:     General:        Right eye: No discharge.        Left eye: No discharge.     Extraocular Movements: Extraocular movements intact.     Conjunctiva/sclera: Conjunctivae normal.     Pupils: Pupils are equal, round, and reactive to light.  Cardiovascular:     Rate and  Rhythm: Normal rate and regular rhythm.     Heart sounds: No murmur heard. Pulmonary:     Effort: Pulmonary effort is normal. No respiratory distress.     Breath sounds: Wheezing present. No rhonchi or rales.  Abdominal:     General: Abdomen is flat. Bowel sounds are normal.  Musculoskeletal:     Cervical back: Normal range of motion and neck supple.  Skin:    General: Skin is warm and dry.     Capillary Refill: Capillary refill takes less than 2 seconds.  Neurological:     General: No focal deficit present.     Mental Status: He is alert and oriented to person, place, and time.  Psychiatric:         Mood and Affect: Mood normal.        Behavior: Behavior normal.        Thought Content: Thought content normal.        Judgment: Judgment normal.     Results for orders placed or performed in visit on 03/05/23  PSA   Collection Time: 03/05/23 10:11 AM  Result Value Ref Range   Prostate Specific Ag, Serum 0.5 0.0 - 4.0 ng/mL      Assessment & Plan:   Problem List Items Addressed This Visit       Cardiovascular and Mediastinum   Essential hypertension   Chronic.  Controlled.  Continue with current medication regimen of Benzepril, Amlodipine , and HCTZ.  Refills sent today.  Continue to check blood pressures at home.  Labs ordered today.  Return to clinic in 6 months for reevaluation.  Call sooner if concerns arise.       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  [facility].   He will have an appointment with Dr. Shyrl surgeon after the CT.  AAA has decreased slightly in size since last imaging.  He has his annual CT scheduled for February.       Relevant Medications   amLODipine  (NORVASC ) 5 MG tablet   atorvastatin  (LIPITOR) 80 MG tablet   benazepril  (LOTENSIN ) 40 MG tablet   fenofibrate  micronized (LOFIBRA) 134 MG capsule   hydrochlorothiazide  (HYDRODIURIL ) 25 MG tablet   Atrial fibrillation, unspecified type (HCC)   Chronic.  Rate controlled.  Continue with current medication regimen.  Followed by [facility].  Continue to follow up with specialist.  Labs ordered today.  Return to clinic in 6 months for reevaluation.  Call sooner if concerns arise.       Relevant Medications   amLODipine  (NORVASC ) 5 MG tablet   atorvastatin  (LIPITOR) 80 MG tablet    benazepril  (LOTENSIN ) 40 MG tablet   fenofibrate  micronized (LOFIBRA) 134 MG capsule   hydrochlorothiazide  (HYDRODIURIL ) 25 MG tablet     Respiratory   COPD (chronic obstructive pulmonary disease) (HCC) - Primary   Chronic.  Controlled.  Continue with current medication regimen of Spiriva .  Refilled today.  Rarely uses Albuterol .  Labs ordered today.  Return to clinic in 6 months for reevaluation.  Call sooner if concerns arise.       Relevant Medications   tiotropium (SPIRIVA  HANDIHALER) 18 MCG inhalation capsule     Genitourinary  Prostate cancer [facility])   Has completed treatment. Last PSA in October 0.5.         Other   Hyperlipidemia   Chronic.  Controlled.  Continue with current medication regimen of Atorvastatin  daily.  Refills sent today.  Labs ordered today.  Return to clinic in 6 months for reevaluation.  Call sooner if concerns arise.       Relevant Medications   amLODipine  (NORVASC ) 5 MG tablet   atorvastatin  (LIPITOR) 80 MG tablet   benazepril  (LOTENSIN ) 40 MG tablet   fenofibrate  micronized (LOFIBRA) 134 MG capsule   hydrochlorothiazide  (HYDRODIURIL ) 25 MG tablet   Other Relevant Orders   Lipid Profile   Chronic alcoholism (HCC)   Chronic.  Controlled.  Rarely drinking alcohol.  Labs ordered today.  Return to clinic in 6 months for reevaluation.  Call sooner if concerns arise.       Other Visit Diagnoses       Flu vaccine need       Relevant Orders   Flu Vaccine Trivalent High Dose (Fluad) (Completed)     Hypercholesteremia       Relevant Medications   amLODipine  (NORVASC ) 5 MG tablet   atorvastatin  (LIPITOR) 80 MG tablet   benazepril  (LOTENSIN ) 40 MG tablet   fenofibrate  micronized (LOFIBRA) 134 MG capsule   hydrochlorothiazide  (HYDRODIURIL ) 25 MG tablet        Follow up plan: Return in about 6 months (around 12/13/2023) for Physical and Fasting labs.

## 2023-06-15 NOTE — Assessment & Plan Note (Signed)
Chronic.  Controlled.  Rarely drinking alcohol.  Labs ordered today.  Return to clinic in 6 months for reevaluation.  Call sooner if concerns arise.

## 2023-06-15 NOTE — Assessment & Plan Note (Signed)
 Has completed treatment. Last PSA in October 0.5.

## 2023-06-15 NOTE — Assessment & Plan Note (Signed)
 Chronic.  [facility].   He will have an appointment with Dr. Cliffton Asters surgeon after the CT.  AAA has decreased slightly in size since last imaging.  He has his annual CT scheduled for February.

## 2023-06-16 LAB — COMPREHENSIVE METABOLIC PANEL
ALT: 12 [IU]/L (ref 0–44)
AST: 13 [IU]/L (ref 0–40)
Albumin: 4.5 g/dL (ref 3.8–4.8)
Alkaline Phosphatase: 65 [IU]/L (ref 44–121)
BUN/Creatinine Ratio: 16 (ref 10–24)
BUN: 16 mg/dL (ref 8–27)
Bilirubin Total: 0.3 mg/dL (ref 0.0–1.2)
CO2: 24 mmol/L (ref 20–29)
Calcium: 9.6 mg/dL (ref 8.6–10.2)
Chloride: 104 mmol/L (ref 96–106)
Creatinine, Ser: 1 mg/dL (ref 0.76–1.27)
Globulin, Total: 2.2 g/dL (ref 1.5–4.5)
Glucose: 89 mg/dL (ref 70–99)
Potassium: 4.5 mmol/L (ref 3.5–5.2)
Sodium: 142 mmol/L (ref 134–144)
Total Protein: 6.7 g/dL (ref 6.0–8.5)
eGFR: 78 mL/min/{1.73_m2} (ref >59)

## 2023-06-16 LAB — LIPID PANEL
Chol/HDL Ratio: 4.5 {ratio} (ref 0.0–5.0)
Cholesterol, Total: 138 mg/dL (ref 100–199)
HDL: 31 mg/dL — ABNORMAL LOW (ref >39)
LDL Chol Calc (NIH): 61 mg/dL (ref 0–99)
Triglycerides: 293 mg/dL — ABNORMAL HIGH (ref 0–149)
VLDL Cholesterol Cal: 46 mg/dL — ABNORMAL HIGH (ref 5–40)

## 2023-07-26 ENCOUNTER — Other Ambulatory Visit: Payer: PPO

## 2023-07-26 ENCOUNTER — Ambulatory Visit
Admission: RE | Admit: 2023-07-26 | Discharge: 2023-07-26 | Disposition: A | Discharge status: Home / Self Care | Payer: PPO | Source: Ambulatory Visit | Attending: Thoracic Surgery (Cardiothoracic Vascular Surgery) | Admitting: Thoracic Surgery (Cardiothoracic Vascular Surgery)

## 2023-07-26 DIAGNOSIS — I7121 Aneurysm of the ascending aorta, without rupture: Secondary | ICD-10-CM

## 2023-07-26 MED ORDER — IOPAMIDOL (ISOVUE-370) INJECTION 76%
75.0000 mL | Freq: Once | INTRAVENOUS | Status: AC | PRN
  Administered 2023-07-26: 75 mL via INTRAVENOUS

## 2023-07-29 ENCOUNTER — Telehealth: Payer: Self-pay | Admitting: Nurse Practitioner

## 2023-07-29 MED ORDER — SPIRIVA RESPIMAT 2.5 MCG/ACT IN AERS: 2.0000 | INHALATION_SPRAY | Freq: Every day | RESPIRATORY_TRACT | 1 refills | Status: DC

## 2023-07-29 NOTE — Telephone Encounter (Signed)
-----   Message from [facility] sent at 07/29/2023 10:06 AM EST ----- Regarding: Medication coverage Hey,   Insurance is requesting alternative for tiotropium (SPIRIVA HANDIHALER) 18 MCG inhalation capsule   1) Ipratropium bromide 2) Spiriva Respimat 3) Incruse Ellipta  Thoughts?

## 2023-07-29 NOTE — Telephone Encounter (Signed)
 Inhaler changed to Spirva Respimat.

## 2023-07-30 ENCOUNTER — Ambulatory Visit (INDEPENDENT_AMBULATORY_CARE_PROVIDER_SITE_OTHER): Payer: PPO | Admitting: Thoracic Surgery (Cardiothoracic Vascular Surgery)

## 2023-07-30 DIAGNOSIS — I7121 Aneurysm of the ascending aorta, without rupture: Secondary | ICD-10-CM

## 2023-07-30 NOTE — Progress Notes (Signed)
       [address]       [place] [address]             [phone]        Patient: Home Provider: Office Consent for Telemedicine visit obtained.   Today's visit was completed via a real-time telehealth (see specific modality noted below). The patient/authorized person provided oral consent at the time of the visit to engage in a telemedicine encounter with the present provider at [facility]. The patient/authorized person was informed of the potential benefits, limitations, and risks of telemedicine. The patient/authorized person expressed understanding that the laws that protect confidentiality also apply to telemedicine. The patient/authorized person acknowledged understanding that telemedicine does not provide emergency services and that he or she would need to call 911 or proceed to the nearest hospital for help if such a need arose.   Total time spent in the clinical discussion 10 minutes.  Telehealth Modality: Phone visit (audio only)  I had a telephone visit with Mr. Horbal.  He is under surveillance for a 4.4 cm ascending aortic aneurysm.  Cross-sectional imaging from earlier this month shows that the aneurysm has been stable.  He continues to take his blood pressure medication and cholesterol medication.  He does also continue to smoke and I reiterated the importance of smoking cessation with him.  His echocardiogram shows normal function of the aortic valve.  Unclear if it is bicuspid.  He will follow-up in 1 year virtually with another CT aortogram.

## 2023-08-09 DIAGNOSIS — E785 Hyperlipidemia, unspecified: Secondary | ICD-10-CM | POA: Diagnosis not present

## 2023-08-09 DIAGNOSIS — I2510 Atherosclerotic heart disease of native coronary artery without angina pectoris: Secondary | ICD-10-CM | POA: Diagnosis not present

## 2023-08-09 DIAGNOSIS — I700 Atherosclerosis of aorta: Secondary | ICD-10-CM | POA: Diagnosis not present

## 2023-08-09 DIAGNOSIS — I480 Paroxysmal atrial fibrillation: Secondary | ICD-10-CM | POA: Diagnosis not present

## 2023-08-09 DIAGNOSIS — I10 Essential (primary) hypertension: Secondary | ICD-10-CM | POA: Diagnosis not present

## 2023-08-09 DIAGNOSIS — I7121 Aneurysm of the ascending aorta, without rupture: Secondary | ICD-10-CM | POA: Diagnosis not present

## 2023-09-06 ENCOUNTER — Other Ambulatory Visit: Payer: Self-pay

## 2023-09-06 DIAGNOSIS — C61 Malignant neoplasm of prostate: Secondary | ICD-10-CM

## 2023-09-07 ENCOUNTER — Other Ambulatory Visit: Payer: Self-pay

## 2023-09-07 ENCOUNTER — Telehealth: Payer: Self-pay | Admitting: Urology

## 2023-09-07 DIAGNOSIS — N138 Other obstructive and reflux uropathy: Secondary | ICD-10-CM

## 2023-09-07 DIAGNOSIS — C61 Malignant neoplasm of prostate: Secondary | ICD-10-CM | POA: Diagnosis not present

## 2023-09-07 NOTE — Telephone Encounter (Signed)
 Lab appt made for 10/1.   Psa order placed.

## 2023-09-07 NOTE — Telephone Encounter (Signed)
 He is here for LABS but says he does them every 6 months, so he needs LABS done a few days before is 10/8 appt with Carollee Herter.

## 2023-09-08 LAB — PSA: Prostate Specific Ag, Serum: 0.5 ng/mL (ref 0.0–4.0)

## 2023-12-16 ENCOUNTER — Ambulatory Visit (INDEPENDENT_AMBULATORY_CARE_PROVIDER_SITE_OTHER): Payer: Self-pay | Admitting: Nurse Practitioner

## 2023-12-16 ENCOUNTER — Encounter: Payer: Self-pay | Admitting: Nurse Practitioner

## 2023-12-16 VITALS — BP 120/76 | HR 65 | Temp 97.8°F | Ht 66.0 in | Wt 161.0 lb

## 2023-12-16 DIAGNOSIS — I10 Essential (primary) hypertension: Secondary | ICD-10-CM | POA: Diagnosis not present

## 2023-12-16 DIAGNOSIS — E782 Mixed hyperlipidemia: Secondary | ICD-10-CM

## 2023-12-16 DIAGNOSIS — F1020 Alcohol dependence, uncomplicated: Secondary | ICD-10-CM

## 2023-12-16 DIAGNOSIS — E7800 Pure hypercholesterolemia, unspecified: Secondary | ICD-10-CM | POA: Diagnosis not present

## 2023-12-16 DIAGNOSIS — I4891 Unspecified atrial fibrillation: Secondary | ICD-10-CM

## 2023-12-16 DIAGNOSIS — I719 Aortic aneurysm of unspecified site, without rupture: Secondary | ICD-10-CM | POA: Diagnosis not present

## 2023-12-16 DIAGNOSIS — Z0000 Encounter for general adult medical examination without abnormal findings: Secondary | ICD-10-CM

## 2023-12-16 DIAGNOSIS — J410 Simple chronic bronchitis: Secondary | ICD-10-CM

## 2023-12-16 MED ORDER — SPIRIVA RESPIMAT 2.5 MCG/ACT IN AERS: 2.0000 | INHALATION_SPRAY | Freq: Every day | RESPIRATORY_TRACT | 1 refills | Status: DC

## 2023-12-16 MED ORDER — AMLODIPINE BESYLATE 5 MG PO TABS: 5.0000 mg | ORAL_TABLET | Freq: Every day | ORAL | 1 refills | Status: DC

## 2023-12-16 MED ORDER — FENOFIBRATE MICRONIZED 134 MG PO CAPS: 134.0000 mg | ORAL_CAPSULE | Freq: Every day | ORAL | 1 refills | Status: DC

## 2023-12-16 MED ORDER — ATORVASTATIN CALCIUM 80 MG PO TABS: 80.0000 mg | ORAL_TABLET | Freq: Every day | ORAL | 1 refills | Status: DC

## 2023-12-16 MED ORDER — BENAZEPRIL HCL 40 MG PO TABS: 40.0000 mg | ORAL_TABLET | Freq: Every day | ORAL | 1 refills | Status: DC

## 2023-12-16 MED ORDER — HYDROCHLOROTHIAZIDE 25 MG PO TABS: 25.0000 mg | ORAL_TABLET | Freq: Every day | ORAL | 1 refills | Status: DC

## 2023-12-16 NOTE — Assessment & Plan Note (Signed)
 Chronic.  Followed [facility].   He will have an appointment with Dr. Shyrl surgeon after the CT.  AAA has decreased slightly in size since last imaging.  He has his annual CT scheduled for February. Reviewed recent note.

## 2023-12-16 NOTE — Assessment & Plan Note (Signed)
 Chronic.  Controlled.  Continue with current medication regimen of Benzepril, Amlodipine , and HCTZ.  Refills sent today.  Blood pressures running 120/70-80 ay home.  Labs ordered today.  Return to clinic in 6 months for reevaluation.  Call sooner if concerns arise.

## 2023-12-16 NOTE — Assessment & Plan Note (Signed)
 Chronic.  Controlled.  Rarely drinking alcohol.  Encouraged continue to abstain from alcohol.  Labs ordered today.  Return to clinic in 6 months for reevaluation.  Call sooner if concerns arise.

## 2023-12-16 NOTE — Assessment & Plan Note (Signed)
Chronic.  Controlled.  Continue with current medication regimen of Spiriva.  Refilled today.  Rarely uses Albuterol.  Labs ordered today.  Return to clinic in 6 months for reevaluation.  Call sooner if concerns arise.

## 2023-12-16 NOTE — Assessment & Plan Note (Signed)
 Chronic.  Rate controlled.  Continue with current medication regimen.  Followed by [facility].  Continue to follow up with specialist.  Reviewed recent note.  Labs ordered today.  Return to clinic in 6 months for reevaluation.  Call sooner if concerns arise.

## 2023-12-16 NOTE — Progress Notes (Signed)
 BP 120/76   Pulse 65   Temp 97.8 F (36.6 C) (Oral)   Ht 5' 6 (1.676 m)   Wt 161 lb (73 kg)   SpO2 95%   BMI 25.99 kg/m    Subjective:    Patient ID: Carl Allen, male    DOB: [birth year], 78 y.o.   MRN: [id number]  HPI: Carl Allen is a 78 y.o. male presenting on 12/16/2023 for comprehensive medical examination. Current medical complaints include:none  He currently lives with: Interim Problems from his last visit: no  HYPERTENSION / HYPERLIPIDEMIA Follows up with CT Surgery.  He has an Angiogram done annually. Satisfied with current treatment? yes Duration of hypertension: years BP monitoring frequency: daily BP range: 120-130/70-80 BP medication side effects: no Past BP meds: amlodipine , benazepril , and HCTZ Duration of hyperlipidemia: years Cholesterol medication side effects: yes Cholesterol supplements: none Past cholesterol medications: atorvastain (lipitor) Medication compliance: excellent compliance Aspirin: no Recent stressonors: no Recurrent headaches: no Visual changes: no Palpitations: no Dyspnea: yes Chest pain: no Lower extremity edema: no Dizzy/lightheaded: no  ALCOHOL USE Very seldom drinks alcohol.  Used to have a problem.  Only drinks socially now and that is rare.  He believes his Afib was 11-12 years ago when he was drinking heavy. Wore a monitor for 2 weeks and no Afib was observed.   COPD Still using Spirva daily.  Very seldom uses Albuterol .  Feels like his breathing is pretty good. Prefers the other form of Spiriva  but doing okay with the generic.  COPD status: controlled Satisfied with current treatment?: yes Oxygen  use: no Dyspnea frequency: when he is active Cough frequency: yes  Rescue inhaler frequency:  very seldom Limitation of activity: yes Productive cough: no Last Spirometry:  Pneumovax: Up to Date Influenza: Up to Date  Depression Screen done today and results listed below:     12/16/2023   10:01 AM 06/15/2023     2:55 PM 02/02/2023   10:05 AM 11/26/2022    8:14 AM 06/25/2022    1:38 PM  Depression screen PHQ 2/9  Decreased Interest 0 0 0 0 1  Down, Depressed, Hopeless 0 0 0 0 0  PHQ - 2 Score 0 0 0 0 1  Altered sleeping 1 0 0 0 1  Tired, decreased energy 0 1 0 0 0  Change in appetite 0 0 0 0 0  Feeling bad or failure about yourself  0 0 0 0 1  Trouble concentrating 0 0 0 0 0  Moving slowly or fidgety/restless 0 0 0 0 0  Suicidal thoughts 0 0 0 0 0  PHQ-9 Score 1 1 0 0 3  Difficult doing work/chores Not difficult at all  Not difficult at all Not difficult at all Not difficult at all    The patient does not have a history of falls. I did complete a risk assessment for falls. A plan of care for falls was documented.   Past Medical History:  Past Medical History:  Diagnosis Date   Allergy    Anxiety    not often   Arthritis    fingers, knees   Cancer (HCC)    Cataract    Chronic alcoholism (HCC)    COPD (chronic obstructive pulmonary disease) (HCC)    Depression    pt states sometimes   Dyspnea    Emphysema of lung (HCC)    GERD (gastroesophageal reflux disease)    pt states every now and then   Hypertension  Hypertriglyceridemia     Surgical History:  Past Surgical History:  Procedure Laterality Date   CATARACT EXTRACTION, BILATERAL  2014   COLONOSCOPY  2012   CYSTOSCOPY N/A 01/10/2018   Procedure: CYSTOSCOPY;  Surgeon: Penne Knee, MD;  Location: [facility];  Service: Urology;  Laterality: N/A;   RADIOACTIVE SEED IMPLANT N/A 01/10/2018   Procedure: RADIOACTIVE SEED IMPLANT/BRACHYTHERAPY IMPLANT;  Surgeon: Penne Knee, MD;  Location: [facility];  Service: Urology;  Laterality: N/A;   TONSILLECTOMY     TONSILLECTOMY AND ADENOIDECTOMY  1952    Medications:  Current Outpatient Medications on File Prior to Visit  Medication Sig   albuterol  (VENTOLIN  HFA) 108 (90 Base) MCG/ACT inhaler Inhale 2 puffs into the lungs every 6 (six) hours as needed for wheezing or shortness of  breath.   ferrous sulfate 325 (65 FE) MG tablet Take 325 mg by mouth daily with breakfast.   Multiple Vitamins-Minerals (CENTRUM SILVER 50+MEN) TABS Take 1 tablet by mouth daily.   Triamcinolone  Acetonide (TRIAMCINOLONE  0.1 % CREAM : EUCERIN) CREA Apply 1 Application topically 3 (three) times daily as needed. Use only on eczematous rash   No current facility-administered medications on file prior to visit.    Allergies:  No Known Allergies  Social History:  Social History   Socioeconomic History   Marital status: Single    Spouse name: Not on file   Number of children: Not on file   Years of education: college   Highest education level: Bachelor's degree (e.g., BA, AB, BS)  Occupational History    Comment: Retired  Tobacco Use   Smoking status: Every Day    Current packs/day: 0.50    Average packs/day: 0.5 packs/day for 60.5 years (30.3 ttl pk-yrs)    Types: Cigarettes    Start date: 1965   Smokeless tobacco: Never  Vaping Use   Vaping status: Never Used  Substance and Sexual Activity   Alcohol use: Yes    Comment: 1 cocktail 2-3 months   Drug use: No   Sexual activity: Not Currently  Other Topics Concern   Not on file  Social History Narrative   Never been married, no children   Social Drivers of Corporate investment banker Strain: Low Risk  (02/02/2023)   Overall Financial Resource Strain (CARDIA)    Difficulty of Paying Living Expenses: Not hard at all  Food Insecurity: No Food Insecurity (02/02/2023)   Hunger Vital Sign    Worried About Running Out of Food in the Last Year: Never true    Ran Out of Food in the Last Year: Never true  Transportation Needs: No Transportation Needs (02/02/2023)   PRAPARE - Administrator, Civil Service (Medical): No    Lack of Transportation (Non-Medical): No  Physical Activity: Insufficiently Active (02/02/2023)   Exercise Vital Sign    Days of Exercise per Week: 2 days    Minutes of Exercise per Session: 20 min  Stress:  No Stress Concern Present (02/02/2023)   [employer] of Occupational Health - Occupational Stress Questionnaire    Feeling of Stress : Not at all  Social Connections: Socially Isolated (02/02/2023)   Social Connection and Isolation Panel    Frequency of Communication with Friends and Family: Once a week    Frequency of Social Gatherings with Friends and Family: Once a week    Attends Religious Services: Never    Database administrator or Organizations: No    Attends Banker Meetings: Never  Marital Status: Never married  Intimate Partner Violence: Not At Risk (02/02/2023)   Humiliation, Afraid, Rape, and Kick questionnaire    Fear of Current or Ex-Partner: No    Emotionally Abused: No    Physically Abused: No    Sexually Abused: No   Social History   Tobacco Use  Smoking Status Every Day   Current packs/day: 0.50   Average packs/day: 0.5 packs/day for 60.5 years (30.3 ttl pk-yrs)   Types: Cigarettes   Start date: 1965  Smokeless Tobacco Never   Social History   Substance and Sexual Activity  Alcohol Use Yes   Comment: 1 cocktail 2-3 months    Family History:  Family History  Problem Relation Age of Onset   Arthritis Mother    Heart disease Mother    Hearing loss Mother    Hyperlipidemia Mother    Hypertension Mother    Kidney disease Mother    Miscarriages / [place] Mother    Vision loss Mother    Stroke Father    Heart disease Maternal Grandmother    Stroke Maternal Grandfather    Cancer Paternal Grandfather     Past medical history, surgical history, medications, allergies, family history and social history reviewed with patient today and changes made to appropriate areas of the chart.   Review of Systems  Eyes:  Negative for blurred vision and double vision.  Respiratory:  Negative for shortness of breath.   Cardiovascular:  Negative for chest pain, palpitations and leg swelling.  Neurological:  Negative for dizziness and headaches.    All other ROS negative except what is listed above and in the HPI.      Objective:    BP 120/76   Pulse 65   Temp 97.8 F (36.6 C) (Oral)   Ht 5' 6 (1.676 m)   Wt 161 lb (73 kg)   SpO2 95%   BMI 25.99 kg/m   Wt Readings from Last 3 Encounters:  12/16/23 161 lb (73 kg)  06/15/23 164 lb 9.6 oz (74.7 kg)  03/09/23 160 lb (72.6 kg)    Physical Exam Vitals and nursing note reviewed.  Constitutional:      General: He is not in acute distress.    Appearance: Normal appearance. He is not ill-appearing, toxic-appearing or diaphoretic.  HENT:     Head: Normocephalic.     Right Ear: Tympanic membrane, ear canal and external ear normal.     Left Ear: Tympanic membrane, ear canal and external ear normal.     Nose: Nose normal. No congestion or rhinorrhea.     Mouth/Throat:     Mouth: Mucous membranes are moist.  Eyes:     General:        Right eye: No discharge.        Left eye: No discharge.     Extraocular Movements: Extraocular movements intact.     Conjunctiva/sclera: Conjunctivae normal.     Pupils: Pupils are equal, round, and reactive to light.  Cardiovascular:     Rate and Rhythm: Normal rate and regular rhythm.     Heart sounds: No murmur heard. Pulmonary:     Effort: Pulmonary effort is normal. No respiratory distress.     Breath sounds: Normal breath sounds. No wheezing, rhonchi or rales.  Abdominal:     General: Abdomen is flat. Bowel sounds are normal. There is no distension.     Palpations: Abdomen is soft.     Tenderness: There is no abdominal tenderness. There is  no guarding.  Musculoskeletal:     Cervical back: Normal range of motion and neck supple.  Skin:    General: Skin is warm and dry.     Capillary Refill: Capillary refill takes less than 2 seconds.  Neurological:     General: No focal deficit present.     Mental Status: He is alert and oriented to person, place, and time.     Cranial Nerves: No cranial nerve deficit.     Motor: No weakness.      Deep Tendon Reflexes: Reflexes normal.  Psychiatric:        Mood and Affect: Mood normal.        Behavior: Behavior normal.        Thought Content: Thought content normal.        Judgment: Judgment normal.     Results for orders placed or performed in visit on 09/07/23  PSA   Collection Time: 09/07/23 10:05 AM  Result Value Ref Range   Prostate Specific Ag, Serum 0.5 0.0 - 4.0 ng/mL      Assessment & Plan:   Problem List Items Addressed This Visit       Cardiovascular and Mediastinum   Essential hypertension   Chronic.  Controlled.  Continue with current medication regimen of Benzepril, Amlodipine , and HCTZ.  Refills sent today.  Blood pressures running 120/70-80 ay home.  Labs ordered today.  Return to clinic in 6 months for reevaluation.  Call sooner if concerns arise.       Relevant Medications   amLODipine  (NORVASC ) 5 MG tablet   atorvastatin  (LIPITOR) 80 MG tablet   benazepril  (LOTENSIN ) 40 MG tablet   fenofibrate  micronized (LOFIBRA) 134 MG capsule   hydrochlorothiazide  (HYDRODIURIL ) 25 MG tablet   Other Relevant Orders   Comprehensive metabolic panel with GFR   Aortic aneurysm without rupture (HCC)   Chronic.  Followed [facility].   He will have an appointment with Dr. Shyrl surgeon after the CT.  AAA has decreased slightly in size since last imaging.  He has his annual CT scheduled for February. Reviewed recent note.       Relevant Medications   amLODipine  (NORVASC ) 5 MG tablet   atorvastatin  (LIPITOR) 80 MG tablet   benazepril  (LOTENSIN ) 40 MG tablet   fenofibrate  micronized (LOFIBRA) 134 MG capsule   hydrochlorothiazide  (HYDRODIURIL ) 25 MG tablet   Atrial fibrillation, unspecified type (HCC)   Chronic.  Rate controlled.  Continue with current medication regimen.  Followed by [facility].  Continue to follow up with specialist.  Reviewed recent note.  Labs ordered today.  Return to clinic in 6 months for reevaluation.  Call sooner if  concerns arise.       Relevant Medications   amLODipine  (NORVASC ) 5 MG tablet   atorvastatin  (LIPITOR) 80 MG tablet   benazepril  (LOTENSIN ) 40 MG tablet   fenofibrate  micronized (LOFIBRA) 134 MG capsule   hydrochlorothiazide  (HYDRODIURIL ) 25 MG tablet   Other Relevant Orders   Comprehensive metabolic panel with GFR   CBC With Diff/Platelet     Respiratory   COPD (chronic obstructive pulmonary disease) (HCC)   Chronic.  Controlled.  Continue with current medication regimen of Spiriva .  Refilled today.  Rarely uses Albuterol .  Labs ordered today.  Return to clinic in 6 months for reevaluation.  Call sooner if concerns arise.       Relevant Medications   Tiotropium Bromide  Monohydrate (SPIRIVA  RESPIMAT) 2.5 MCG/ACT AERS     Other  Hyperlipidemia   Chronic.  Controlled.  Continue with current medication regimen of Atorvastatin  daily.  Refills sent today.  Labs ordered today.  Return to clinic in 6 months for reevaluation.  Call sooner if concerns arise.       Relevant Medications   amLODipine  (NORVASC ) 5 MG tablet   atorvastatin  (LIPITOR) 80 MG tablet   benazepril  (LOTENSIN ) 40 MG tablet   fenofibrate  micronized (LOFIBRA) 134 MG capsule   hydrochlorothiazide  (HYDRODIURIL ) 25 MG tablet   Other Relevant Orders   Lipid panel   Chronic alcoholism (HCC)   Chronic.  Controlled.  Rarely drinking alcohol.  Encouraged continue to abstain from alcohol.  Labs ordered today.  Return to clinic in 6 months for reevaluation.  Call sooner if concerns arise.       Relevant Orders   CBC With Diff/Platelet   Other Visit Diagnoses       Annual physical exam    -  Primary   Health maintenance reviewed during visit today.  Labs ordered.  Vaccines reviewed.     Hypercholesteremia       Relevant Medications   amLODipine  (NORVASC ) 5 MG tablet   atorvastatin  (LIPITOR) 80 MG tablet   benazepril  (LOTENSIN ) 40 MG tablet   fenofibrate  micronized (LOFIBRA) 134 MG capsule   hydrochlorothiazide   (HYDRODIURIL ) 25 MG tablet        Discussed aspirin prophylaxis for myocardial infarction prevention and decision was it was not indicated  LABORATORY TESTING:  Health maintenance labs ordered today as discussed above.   The natural history of prostate cancer and ongoing controversy regarding screening and potential treatment outcomes of prostate cancer has been discussed with the patient. The meaning of a false positive PSA and a false negative PSA has been discussed. He indicates understanding of the limitations of this screening test and wishes to proceed with screening PSA testing.   IMMUNIZATIONS:   - Tdap: Tetanus vaccination status reviewed: last tetanus booster within 10 years. - Influenza: Postponed to flu season - Pneumovax: Up to date - Prevnar: Up to date - COVID: Up to date - HPV: Not applicable - Shingrix vaccine: Up to date  SCREENING: - Colonoscopy: Not applicable  Discussed with patient purpose of the colonoscopy is to detect colon cancer at curable precancerous or early stages   - AAA Screening: Up to date  -Hearing Test: Not applicable  -Spirometry: Not applicable   PATIENT COUNSELING:    Sexuality: Discussed sexually transmitted diseases, partner selection, use of condoms, avoidance of unintended pregnancy  and contraceptive alternatives.   Advised to avoid cigarette smoking.  I discussed with the patient that most people either abstain from alcohol or drink within safe limits (<=14/week and <=4 drinks/occasion for males, <=7/weeks and <= 3 drinks/occasion for females) and that the risk for alcohol disorders and other health effects rises proportionally with the number of drinks per week and how often a drinker exceeds daily limits.  Discussed cessation/primary prevention of drug use and availability of treatment for abuse.   Diet: Encouraged to adjust caloric intake to maintain  or achieve ideal body weight, to reduce intake of dietary saturated fat and  total fat, to limit sodium intake by avoiding high sodium foods and not adding table salt, and to maintain adequate dietary potassium and calcium  preferably from fresh fruits, vegetables, and low-fat dairy products.    stressed the importance of regular exercise  Injury prevention: Discussed safety belts, safety helmets, smoke detector, smoking near bedding or upholstery.   Dental health:  Discussed importance of regular tooth brushing, flossing, and dental visits.   Follow up plan: NEXT PREVENTATIVE PHYSICAL DUE IN 1 YEAR. Return in about 6 months (around 06/17/2024) for HTN, HLD, DM2 FU.

## 2023-12-16 NOTE — Assessment & Plan Note (Signed)
Chronic.  Controlled.  Continue with current medication regimen of Atorvastatin daily.  Refills sent today.  Labs ordered today.  Return to clinic in 6 months for reevaluation.  Call sooner if concerns arise.    

## 2023-12-16 NOTE — Progress Notes (Deleted)
 There were no vitals taken for this visit.   Subjective:    Patient ID: Carl Allen, male    DOB: 05/29/1946, 78 y.o.   MRN: [id number]  HPI: Carl Allen is a 78 y.o. male  No chief complaint on file.  HYPERTENSION / HYPERLIPIDEMIA Follows up with CT Surgery.  He has an Angiogram done annually. Satisfied with current treatment? yes Duration of hypertension: years BP monitoring frequency: sometimes BP range: 120-130/70-80 BP medication side effects: no Past BP meds: amlodipine , benazepril , and HCTZ Duration of hyperlipidemia: years Cholesterol medication side effects: yes Cholesterol supplements: none Past cholesterol medications: atorvastain (lipitor) Medication compliance: excellent compliance Aspirin: no Recent stressonors: no Recurrent headaches: no Visual changes: no Palpitations: no Dyspnea: yes Chest pain: no Lower extremity edema: no Dizzy/lightheaded: no  ALCOHOL USE Very seldom drinks alcohol.  Used to have a problem.  Only drinks socially now and that is rare.    COPD Still using Spirva daily.  Very seldom uses Albuterol .  Feels like his breathing is pretty good.  COPD status: controlled Satisfied with current treatment?: yes Oxygen  use: no Dyspnea frequency: when he is active Cough frequency: yes  Rescue inhaler frequency:  very seldom Limitation of activity: yes Productive cough: no Last Spirometry:  Pneumovax: Up to Date Influenza: Up to Date   Relevant past medical, surgical, family and social history reviewed and updated as indicated. Interim medical history since our last visit reviewed. Allergies and medications reviewed and updated.  Review of Systems  Eyes:  Negative for visual disturbance.  Respiratory:  Positive for shortness of breath. Negative for chest tightness.   Cardiovascular:  Negative for chest pain, palpitations and leg swelling.  Neurological:  Negative for dizziness, light-headedness and headaches.    Per HPI  unless specifically indicated above     Objective:    There were no vitals taken for this visit.  Wt Readings from Last 3 Encounters:  06/15/23 164 lb 9.6 oz (74.7 kg)  03/09/23 160 lb (72.6 kg)  02/02/23 162 lb 6.4 oz (73.7 kg)    Physical Exam Vitals and nursing note reviewed.  Constitutional:      General: He is not in acute distress.    Appearance: Normal appearance. He is not ill-appearing, toxic-appearing or diaphoretic.  HENT:     Head: Normocephalic.     Right Ear: External ear normal.     Left Ear: External ear normal.     Nose: Nose normal. No congestion or rhinorrhea.     Mouth/Throat:     Mouth: Mucous membranes are moist.  Eyes:     General:        Right eye: No discharge.        Left eye: No discharge.     Extraocular Movements: Extraocular movements intact.     Conjunctiva/sclera: Conjunctivae normal.     Pupils: Pupils are equal, round, and reactive to light.  Cardiovascular:     Rate and Rhythm: Normal rate and regular rhythm.     Heart sounds: No murmur heard. Pulmonary:     Effort: Pulmonary effort is normal. No respiratory distress.     Breath sounds: Wheezing present. No rhonchi or rales.  Abdominal:     General: Abdomen is flat. Bowel sounds are normal.  Musculoskeletal:     Cervical back: Normal range of motion and neck supple.  Skin:    General: Skin is warm and dry.     Capillary Refill: Capillary refill takes less than 2  seconds.  Neurological:     General: No focal deficit present.     Mental Status: He is alert and oriented to person, place, and time.  Psychiatric:        Mood and Affect: Mood normal.        Behavior: Behavior normal.        Thought Content: Thought content normal.        Judgment: Judgment normal.     Results for orders placed or performed in visit on 09/07/23  PSA   Collection Time: 09/07/23 10:05 AM  Result Value Ref Range   Prostate Specific Ag, Serum 0.5 0.0 - 4.0 ng/mL      Assessment & Plan:   Problem  List Items Addressed This Visit       Cardiovascular and Mediastinum   Essential hypertension   Aortic aneurysm without rupture (HCC)   Atrial fibrillation, unspecified type (HCC)     Respiratory   COPD (chronic obstructive pulmonary disease) (HCC) - Primary     Other   Hyperlipidemia   Chronic alcoholism (HCC)     Follow up plan: No follow-ups on file.

## 2023-12-17 ENCOUNTER — Ambulatory Visit: Payer: Self-pay | Admitting: Nurse Practitioner

## 2023-12-17 LAB — CBC WITH DIFF/PLATELET
Basophils Absolute: 0.1 x10E3/uL (ref 0.0–0.2)
Basos: 1 %
EOS (ABSOLUTE): 0.1 x10E3/uL (ref 0.0–0.4)
Eos: 2 %
Hematocrit: 43.3 % (ref 37.5–51.0)
Hemoglobin: 14.2 g/dL (ref 13.0–17.7)
Immature Grans (Abs): 0 x10E3/uL (ref 0.0–0.1)
Immature Granulocytes: 0 %
Lymphocytes Absolute: 2.1 x10E3/uL (ref 0.7–3.1)
Lymphs: 30 %
MCH: 32.1 pg (ref 26.6–33.0)
MCHC: 32.8 g/dL (ref 31.5–35.7)
MCV: 98 fL — ABNORMAL HIGH (ref 79–97)
Monocytes Absolute: 0.5 x10E3/uL (ref 0.1–0.9)
Monocytes: 7 %
Neutrophils Absolute: 4.2 x10E3/uL (ref 1.4–7.0)
Neutrophils: 60 %
Platelets: 206 x10E3/uL (ref 150–450)
RBC: 4.43 x10E6/uL (ref 4.14–5.80)
RDW: 12.4 % (ref 11.6–15.4)
WBC: 7 x10E3/uL (ref 3.4–10.8)

## 2023-12-17 LAB — COMPREHENSIVE METABOLIC PANEL WITH GFR
ALT: 9 IU/L (ref 0–44)
AST: 13 IU/L (ref 0–40)
Albumin: 4.6 g/dL (ref 3.8–4.8)
Alkaline Phosphatase: 63 IU/L (ref 44–121)
BUN/Creatinine Ratio: 15 (ref 10–24)
BUN: 14 mg/dL (ref 8–27)
Bilirubin Total: 0.5 mg/dL (ref 0.0–1.2)
CO2: 23 mmol/L (ref 20–29)
Calcium: 9.6 mg/dL (ref 8.6–10.2)
Chloride: 103 mmol/L (ref 96–106)
Creatinine, Ser: 0.94 mg/dL (ref 0.76–1.27)
Globulin, Total: 2.1 g/dL (ref 1.5–4.5)
Glucose: 95 mg/dL (ref 70–99)
Potassium: 4.3 mmol/L (ref 3.5–5.2)
Sodium: 142 mmol/L (ref 134–144)
Total Protein: 6.7 g/dL (ref 6.0–8.5)
eGFR: 83 mL/min/1.73 (ref >59)

## 2023-12-17 LAB — LIPID PANEL
Chol/HDL Ratio: 4 ratio (ref 0.0–5.0)
Cholesterol, Total: 125 mg/dL (ref 100–199)
HDL: 31 mg/dL — ABNORMAL LOW (ref >39)
LDL Chol Calc (NIH): 55 mg/dL (ref 0–99)
Triglycerides: 241 mg/dL — ABNORMAL HIGH (ref 0–149)
VLDL Cholesterol Cal: 39 mg/dL (ref 5–40)

## 2024-02-08 ENCOUNTER — Ambulatory Visit

## 2024-02-08 ENCOUNTER — Ambulatory Visit (INDEPENDENT_AMBULATORY_CARE_PROVIDER_SITE_OTHER): Payer: Self-pay | Admitting: Emergency Medicine

## 2024-02-08 VITALS — BP 124/80 | Ht 67.25 in | Wt 162.0 lb

## 2024-02-08 DIAGNOSIS — Z23 Encounter for immunization: Secondary | ICD-10-CM

## 2024-02-08 DIAGNOSIS — Z0000 Encounter for general adult medical examination without abnormal findings: Secondary | ICD-10-CM | POA: Diagnosis not present

## 2024-02-08 NOTE — Patient Instructions (Signed)
 Carl Allen,  Thank you for taking the time for your Medicare Wellness Visit. I appreciate your continued commitment to your health goals. Please review the care plan we discussed, and feel free to reach out if I can assist you further.  Medicare recommends these wellness visits once per year to help you and your care team stay ahead of potential health issues. These visits are designed to focus on prevention, allowing your provider to concentrate on managing your acute and chronic conditions during your regular appointments.  Please note that Annual Wellness Visits do not include a physical exam. Some assessments may be limited, especially if the visit was conducted virtually. If needed, we may recommend a separate in-person follow-up with your provider.  Ongoing Care Seeing your primary care provider every 3 to 6 months helps us  monitor your health and provide consistent, personalized care.   Referrals If a referral was made during today's visit and you haven't received any updates within two weeks, please contact the referred provider directly to check on the status.  Recommended Screenings:  Call to schedule a routine eye exam with Dr. Dingeldein at your convenience. Get the covid vaccine at your local pharmacy at your convenience.  Health Maintenance  Topic Date Due   Flu Shot  12/31/2023   COVID-19 Vaccine (8 - 2025-26 season) 01/31/2024   Screening for Lung Cancer  07/25/2024   Medicare Annual Wellness Visit  02/07/2025   DTaP/Tdap/Td vaccine (4 - Td or Tdap) 11/26/2031   Pneumococcal Vaccine for age over 25  Completed   Hepatitis C Screening  Completed   Zoster (Shingles) Vaccine  Completed   HPV Vaccine  Aged Out   Meningitis B Vaccine  Aged Out   Colon Cancer Screening  Discontinued       02/08/2024   10:21 AM  Advanced Directives  Does Patient Have a Medical Advance Directive? No  Would patient like information on creating a medical advance directive? Yes  (MAU/Ambulatory/Procedural Areas - Information given)   Advance Care Planning is important because it: Ensures you receive medical care that aligns with your values, goals, and preferences. Provides guidance to your family and loved ones, reducing the emotional burden of decision-making during critical moments.  Vision: Annual vision screenings are recommended for early detection of glaucoma, cataracts, and diabetic retinopathy. These exams can also reveal signs of chronic conditions such as diabetes and high blood pressure.  Dental: Annual dental screenings help detect early signs of oral cancer, gum disease, and other conditions linked to overall health, including heart disease and diabetes.  Please see the attached documents for additional preventive care recommendations.

## 2024-02-08 NOTE — Progress Notes (Signed)
 Subjective:   Carl Allen is a 78 y.o. who presents for a Medicare Wellness preventive visit.  As a reminder, Annual Wellness Visits don't include a physical exam, and some assessments may be limited, especially if this visit is performed virtually. We may recommend an in-person follow-up visit with your provider if needed.  Visit Complete: In person    Persons Participating in Visit: Patient.  AWV Questionnaire: No: Patient Medicare AWV questionnaire was not completed prior to this visit.  Cardiac Risk Factors include: advanced age (>23men, >29 women);male gender;hypertension;dyslipidemia;smoking/ tobacco exposure     Objective:    Today's Vitals   02/08/24 1002  BP: 124/80  Weight: 162 lb (73.5 kg)  Height: 5' 7.25 (1.708 m)   Body mass index is 25.18 kg/m.     02/08/2024   10:21 AM 02/02/2023   10:09 AM 01/26/2022    9:40 AM 09/18/2021   10:47 AM 01/24/2021    9:51 AM 08/31/2019   11:23 AM 02/16/2019   10:51 AM  Advanced Directives  Does Patient Have a Medical Advance Directive? No Yes No No No No No  Type of Advance Directive  Healthcare Power of Attorney       Does patient want to make changes to medical advance directive?  No - Patient declined       Copy of Healthcare Power of Attorney in Chart?  No - copy requested       Would patient like information on creating a medical advance directive? Yes (MAU/Ambulatory/Procedural Areas - Information given)  No - Patient declined No - Patient declined  No - Patient declined No - Patient declined    Current Medications (verified) Outpatient Encounter Medications as of 02/08/2024  Medication Sig   albuterol  (VENTOLIN  HFA) 108 (90 Base) MCG/ACT inhaler Inhale 2 puffs into the lungs every 6 (six) hours as needed for wheezing or shortness of breath.   amLODipine  (NORVASC ) 5 MG tablet Take 1 tablet (5 mg total) by mouth daily.   atorvastatin  (LIPITOR) 80 MG tablet Take 1 tablet (80 mg total) by mouth daily.   benazepril   (LOTENSIN ) 40 MG tablet Take 1 tablet (40 mg total) by mouth daily.   fenofibrate  micronized (LOFIBRA) 134 MG capsule Take 1 capsule (134 mg total) by mouth daily before breakfast.   ferrous sulfate 325 (65 FE) MG tablet Take 325 mg by mouth daily with breakfast.   hydrochlorothiazide  (HYDRODIURIL ) 25 MG tablet Take 1 tablet (25 mg total) by mouth daily.   Multiple Vitamins-Minerals (CENTRUM SILVER 50+MEN) TABS Take 1 tablet by mouth daily.   Tiotropium Bromide  Monohydrate (SPIRIVA  RESPIMAT) 2.5 MCG/ACT AERS Inhale 2 puffs into the lungs daily.   Triamcinolone  Acetonide (TRIAMCINOLONE  0.1 % CREAM : EUCERIN) CREA Apply 1 Application topically 3 (three) times daily as needed. Use only on eczematous rash   No facility-administered encounter medications on file as of 02/08/2024.    Allergies (verified) Patient has no known allergies.   History: Past Medical History:  Diagnosis Date   Allergy    Anxiety    not often   Arthritis    fingers, knees   Cancer (HCC)    Cataract    Chronic alcoholism (HCC)    COPD (chronic obstructive pulmonary disease) (HCC)    Depression    pt states sometimes   Dyspnea    Emphysema of lung (HCC)    GERD (gastroesophageal reflux disease)    pt states every now and then   Hypertension    Hypertriglyceridemia  Past Surgical History:  Procedure Laterality Date   CATARACT EXTRACTION, BILATERAL  2014   COLONOSCOPY  2012   CYSTOSCOPY N/A 01/10/2018   Procedure: CYSTOSCOPY;  Surgeon: Penne Knee, MD;  Location: [facility] OR[facility];  Service: Urology;  Laterality: N/A;   RADIOACTIVE SEED IMPLANT N/A 01/10/2018   Procedure: RADIOACTIVE SEED IMPLANT/BRACHYTHERAPY IMPLANT;  Surgeon: Penne Knee, MD;  Location: [facility] OR[facility];  Service: Urology;  Laterality: N/A;   TONSILLECTOMY     TONSILLECTOMY AND ADENOIDECTOMY  1952   Family History  Problem Relation Age of Onset   Arthritis Mother    Heart disease Mother    Hearing loss Mother    Hyperlipidemia Mother     Hypertension Mother    Kidney disease Mother    Miscarriages / [place] Mother    Vision loss Mother    Stroke Father    Heart disease Maternal Grandmother    Stroke Maternal Grandfather    Cancer Paternal Grandfather    Social History   Socioeconomic History   Marital status: Single    Spouse name: Not on file   Number of children: 0   Years of education: college   Highest education level: Bachelor's degree (e.g., BA, AB, BS)  Occupational History    Comment: Retired  Tobacco Use   Smoking status: Every Day    Current packs/day: 0.75    Average packs/day: 0.8 packs/day for 59.7 years (44.8 ttl pk-yrs)    Types: Cigarettes    Start date: 1966   Smokeless tobacco: Never  Vaping Use   Vaping status: Never Used  Substance and Sexual Activity   Alcohol use: Yes    Comment: 1 cocktail 2-3 months   Drug use: No   Sexual activity: Not Currently  Other Topics Concern   Not on file  Social History Narrative   Never been married, no children   Social Drivers of Corporate investment banker Strain: Low Risk  (02/08/2024)   Overall Financial Resource Strain (CARDIA)    Difficulty of Paying Living Expenses: Not very hard  Food Insecurity: No Food Insecurity (02/08/2024)   Hunger Vital Sign    Worried About Running Out of Food in the Last Year: Never true    Ran Out of Food in the Last Year: Never true  Transportation Needs: No Transportation Needs (02/08/2024)   PRAPARE - Administrator, Civil Service (Medical): No    Lack of Transportation (Non-Medical): No  Physical Activity: Insufficiently Active (02/08/2024)   Exercise Vital Sign    Days of Exercise per Week: 1 day    Minutes of Exercise per Session: 20 min  Stress: No Stress Concern Present (02/08/2024)   [employer] of Occupational Health - Occupational Stress Questionnaire    Feeling of Stress: Only a little  Social Connections: Unknown (02/08/2024)   Social Connection and Isolation Panel    Frequency of  Communication with Friends and Family: Once a week    Frequency of Social Gatherings with Friends and Family: Not on file    Attends Religious Services: Never    Database administrator or Organizations: No    Attends Engineer, structural: Never    Marital Status: Never married    Tobacco Counseling Ready to quit: Not Answered Counseling given: Not Answered    Clinical Intake:  Pre-visit preparation completed: Yes  Pain : No/denies pain     BMI - recorded: 25.18 Nutritional Status: BMI 25 -29 Overweight Nutritional Risks: None Diabetes: No  No results found for: HGBA1C   How often do you need to have someone help you when you read instructions, pamphlets, or other written materials from your doctor or pharmacy?: 1 - Never  Interpreter Needed?: No  Information entered by :: Vina Ned, CMA   Activities of Daily Living     02/08/2024   10:07 AM  In your present state of health, do you have any difficulty performing the following activities:  Hearing? 0  Vision? 0  Difficulty concentrating or making decisions? 0  Walking or climbing stairs? 0  Dressing or bathing? 0  Doing errands, shopping? 0  Preparing Food and eating ? N  Using the Toilet? N  In the past six months, have you accidently leaked urine? N  Do you have problems with loss of bowel control? N  Managing your Medications? N  Managing your Finances? N  Housekeeping or managing your Housekeeping? N    Patient Care Team: Melvin Pao, NP as PCP - General (Nurse Practitioner) Dewane Shiner, DO as Referring Physician (Cardiology) Dingeldein, Elspeth, MD (Ophthalmology) Helon Clotilda LABOR, PA-C as Physician Assistant (Urology)  I have updated your Care Teams any recent Medical Services you may have received from other providers in the past year.     Assessment:   This is a routine wellness examination for [place].  Hearing/Vision screen Hearing Screening - Comments:: Denies hearing  loss  Vision Screening - Comments:: Gets routine    Goals Addressed             This Visit's Progress    Patient Stated       Lose 10 lbs       Depression Screen     02/08/2024   10:16 AM 12/16/2023   10:01 AM 06/15/2023    2:55 PM 02/02/2023   10:05 AM 11/26/2022    8:14 AM 06/25/2022    1:38 PM 01/26/2022    9:52 AM  PHQ 2/9 Scores  PHQ - 2 Score 1 0 0 0 0 1 0  PHQ- 9 Score 3 1 1  0 0 3     Fall Risk     02/08/2024   10:24 AM 12/16/2023   10:01 AM 02/02/2023   10:10 AM 11/26/2022    8:13 AM 06/25/2022    1:37 PM  Fall Risk   Falls in the past year? 0 0 0 0 0  Number falls in past yr: 0 0 0 0 0  Injury with Fall? 0 1 0 0 0  Risk for fall due to : No Fall Risks No Fall Risks No Fall Risks No Fall Risks No Fall Risks  Follow up Falls evaluation completed Falls evaluation completed Falls prevention discussed Falls evaluation completed Falls evaluation completed    MEDICARE RISK AT HOME:  Medicare Risk at Home Any stairs in or around the home?: Yes If so, are there any without handrails?: No Home free of loose throw rugs in walkways, pet beds, electrical cords, etc?: Yes Adequate lighting in your home to reduce risk of falls?: Yes Life alert?: No Use of a cane, walker or w/c?: No Grab bars in the bathroom?: Yes Shower chair or bench in shower?: No Elevated toilet seat or a handicapped toilet?: Yes  TIMED UP AND GO:  Was the test performed?  Yes  Length of time to ambulate 10 feet: 5 sec Gait steady and fast without use of assistive device  Cognitive Function: 6CIT completed        02/08/2024  10:25 AM 02/02/2023   10:11 AM 01/26/2022    9:44 AM 01/24/2021    9:56 AM 01/26/2018    9:14 AM  6CIT Screen  What Year? 0 points 0 points 0 points 0 points 0 points  What month? 0 points 0 points 0 points 0 points 0 points  What time? 0 points 0 points 0 points 0 points 0 points  Count back from 20 0 points 0 points 0 points 0 points 0 points  Months in reverse 0 points 0  points 0 points 0 points 0 points  Repeat phrase 0 points 0 points 0 points 0 points 0 points  Total Score 0 points 0 points 0 points 0 points 0 points    Immunizations Immunization History  Administered Date(s) Administered   Fluad Quad(high Dose 65+) 03/07/2019, 03/12/2020, 02/10/2021, 06/25/2022   Fluad Trivalent(High Dose 65+) 06/15/2023   INFLUENZA, HIGH DOSE SEASONAL PF 04/07/2017, 02/02/2018   Influenza-Unspecified 02/09/2015, 02/24/2016   PFIZER Comirnaty(Gray Top)Covid-19 Tri-Sucrose Vaccine 10/04/2020   PFIZER(Purple Top)SARS-COV-2 Vaccination 07/12/2019, 08/02/2019, 02/29/2020, 10/04/2020, 02/16/2021   Pfizer Covid-19 Vaccine Bivalent Booster 70yrs & up 03/14/2021   Pfizer(Comirnaty)Fall Seasonal Vaccine 12 years and older 07/01/2022   Pneumococcal Conjugate-13 12/21/2013   Pneumococcal Polysaccharide-23 03/22/2008, 02/13/2013   Td 09/20/2001   Tdap 01/08/2016, 11/25/2021   Zoster Recombinant(Shingrix) 03/18/2021, 06/12/2021   Zoster, Live 12/18/2010    Screening Tests Health Maintenance  Topic Date Due   Influenza Vaccine  12/31/2023   COVID-19 Vaccine (8 - 2025-26 season) 01/31/2024   Lung Cancer Screening  07/25/2024   Medicare Annual Wellness (AWV)  02/07/2025   DTaP/Tdap/Td (4 - Td or Tdap) 11/26/2031   Pneumococcal Vaccine: 50+ Years  Completed   Hepatitis C Screening  Completed   Zoster Vaccines- Shingrix  Completed   HPV VACCINES  Aged Out   Meningococcal B Vaccine  Aged Out   Colonoscopy  Discontinued    Health Maintenance Items Addressed: See Nurse Notes at the end of this note  Additional Screening:  Vision Screening: Recommended annual ophthalmology exams for early detection of glaucoma and other disorders of the eye. Is the patient up to date with their annual eye exam?  No  Who is the provider or what is the name of the office in which the patient attends annual eye exams? Dr. Dingeldein, [facility]  Dental Screening: Recommended annual  dental exams for proper oral hygiene  Community Resource Referral / Chronic Care Management: CRR required this visit?  No   CCM required this visit?  No   Plan:    I have personally reviewed and noted the following in the patient's chart:   Medical and social history Use of alcohol, tobacco or illicit drugs  Current medications and supplements including opioid prescriptions. Patient is not currently taking opioid prescriptions. Functional ability and status Nutritional status Physical activity Advanced directives List of other physicians Hospitalizations, surgeries, and ER visits in previous 12 months Vitals Screenings to include cognitive, depression, and falls Referrals and appointments  In addition, I have reviewed and discussed with patient certain preventive protocols, quality metrics, and best practice recommendations. A written personalized care plan for preventive services as well as general preventive health recommendations were provided to patient.   Vina Ned, CMA   02/08/2024   After Visit Summary: (In Person-Printed) AVS printed and given to the patient  Notes:  Getting flu vaccine today with nurse Will get Covid vaccine at his local pharmacy Needs routine eye exam. Patient to call  and schedule.

## 2024-02-08 NOTE — Progress Notes (Signed)
 Patient is in office today for a nurse visit for Flu vaccine. Patient Injection was given in the  Left deltoid. Patient tolerated injection well.

## 2024-02-09 DIAGNOSIS — I480 Paroxysmal atrial fibrillation: Secondary | ICD-10-CM | POA: Diagnosis not present

## 2024-02-09 DIAGNOSIS — I10 Essential (primary) hypertension: Secondary | ICD-10-CM | POA: Diagnosis not present

## 2024-02-09 DIAGNOSIS — I2510 Atherosclerotic heart disease of native coronary artery without angina pectoris: Secondary | ICD-10-CM | POA: Diagnosis not present

## 2024-02-09 DIAGNOSIS — I7121 Aneurysm of the ascending aorta, without rupture: Secondary | ICD-10-CM | POA: Diagnosis not present

## 2024-02-11 ENCOUNTER — Telehealth: Payer: Self-pay

## 2024-02-11 NOTE — Progress Notes (Signed)
   02/11/2024  Patient ID: Carl Allen, male   DOB: 17-Feb-1946, 78 y.o.   MRN: [id number]  This patient is appearing on a report for being at risk of failing the adherence measure for cholesterol (statin) medications this calendar year.   Medication: atorvastatin  80mg  Last fill date: 12/16/23 for 90 day supply  Insurance report was not up to date. No action needed at this time.   Channing DELENA Mealing, PharmD, DPLA

## 2024-03-01 ENCOUNTER — Other Ambulatory Visit

## 2024-03-01 DIAGNOSIS — N138 Other obstructive and reflux uropathy: Secondary | ICD-10-CM | POA: Diagnosis not present

## 2024-03-01 DIAGNOSIS — N401 Enlarged prostate with lower urinary tract symptoms: Secondary | ICD-10-CM | POA: Diagnosis not present

## 2024-03-01 DIAGNOSIS — C61 Malignant neoplasm of prostate: Secondary | ICD-10-CM

## 2024-03-02 LAB — PSA: Prostate Specific Ag, Serum: 0.8 ng/mL (ref 0.0–4.0)

## 2024-03-06 NOTE — Progress Notes (Unsigned)
 03/08/24 11:59 AM   Carl Allen February 05, 1946 [id number]  Referring provider:  Melvin Pao, NP [address] [place],  [place] [address]  Urological history: 1.  Prostate cancer - PSA (03/2024) 0.8 -intermediate risk prostate cancer  -initially diagnosed with low risk prostate cancer in 2018 -repeat biopsy which showed a more aggressive, Gleason 3+4 disease and 2 of the cores -brachytherapy seeds in 12/2017    2. BPH with LU TS  Chief Complaint  Patient presents with   Medical Management of Chronic Issues    HPI: Carl Allen is a 78 y.o.male who presents today for 12 month follow-up.  Previous records reviewed.   I PSS 3/2  He is having non bothersome frequency.  Patient denies any modifying or aggravating factors.  Patient denies any recent UTI's, gross hematuria, dysuria or suprapubic/flank pain.  Patient denies any fevers, chills, nausea or vomiting.    PSA 0.8  Serum creatinine (11/2023) 0.94, eGFR 83  Diuretics: hydrochlorothiazide    PMH: Past Medical History:  Diagnosis Date   Allergy    Anxiety    not often   Arthritis    fingers, knees   Cancer (HCC)    Cataract    Chronic alcoholism (HCC)    COPD (chronic obstructive pulmonary disease) (HCC)    Depression    pt states sometimes   Dyspnea    Emphysema of lung (HCC)    GERD (gastroesophageal reflux disease)    pt states every now and then   Hypertension    Hypertriglyceridemia     Surgical History: Past Surgical History:  Procedure Laterality Date   CATARACT EXTRACTION, BILATERAL  2014   COLONOSCOPY  2012   CYSTOSCOPY N/A 01/10/2018   Procedure: CYSTOSCOPY;  Surgeon: Penne Knee, MD;  Location: [facility] ORS;  Service: Urology;  Laterality: N/A;   RADIOACTIVE SEED IMPLANT N/A 01/10/2018   Procedure: RADIOACTIVE SEED IMPLANT/BRACHYTHERAPY IMPLANT;  Surgeon: Penne Knee, MD;  Location: [facility] OR[facility];  Service: Urology;  Laterality: N/A;   TONSILLECTOMY     TONSILLECTOMY AND  ADENOIDECTOMY  1952    Home Medications:  Allergies as of 03/08/2024   No Known Allergies      Medication List        Accurate as of March 08, 2024 11:59 AM. If you have any questions, ask your nurse or doctor.          albuterol  108 (90 Base) MCG/ACT inhaler Commonly known as: VENTOLIN  HFA Inhale 2 puffs into the lungs every 6 (six) hours as needed for wheezing or shortness of breath.   amLODipine  5 MG tablet Commonly known as: NORVASC  Take 1 tablet (5 mg total) by mouth daily.   atorvastatin  80 MG tablet Commonly known as: LIPITOR Take 1 tablet (80 mg total) by mouth daily.   benazepril  40 MG tablet Commonly known as: LOTENSIN  Take 1 tablet (40 mg total) by mouth daily.   Centrum Silver 50+Men Tabs Take 1 tablet by mouth daily.   fenofibrate  micronized 134 MG capsule Commonly known as: LOFIBRA Take 1 capsule (134 mg total) by mouth daily before breakfast.   ferrous sulfate 325 (65 FE) MG tablet Take 325 mg by mouth daily with breakfast.   hydrochlorothiazide  25 MG tablet Commonly known as: HYDRODIURIL  Take 1 tablet (25 mg total) by mouth daily.   Spiriva  Respimat 2.5 MCG/ACT Aers Generic drug: Tiotropium Bromide  Monohydrate Inhale 2 puffs into the lungs daily.   triamcinolone  0.1 % cream : eucerin Crea Apply 1 Application topically  3 (three) times daily as needed. Use only on eczematous rash        Allergies:  No Known Allergies  Family History: Family History  Problem Relation Age of Onset   Arthritis Mother    Heart disease Mother    Hearing loss Mother    Hyperlipidemia Mother    Hypertension Mother    Kidney disease Mother    Miscarriages / [place] Mother    Vision loss Mother    Stroke Father    Heart disease Maternal Grandmother    Stroke Maternal Grandfather    Cancer Paternal Grandfather     Social History:  reports that he has been smoking cigarettes. He started smoking about 59 years ago. He has a 44.8 pack-year smoking  history. He has never used smokeless tobacco. He reports current alcohol use. He reports that he does not use drugs.   Physical Exam: BP 129/73   Pulse 71   Ht 5' 8 (1.727 m)   Wt 160 lb (72.6 kg)   BMI 24.33 kg/m   Constitutional:  Well nourished. Alert and oriented, No acute distress. HEENT: [place] AT, moist mucus membranes.  Trachea midline Cardiovascular: No clubbing, cyanosis, or edema. Respiratory: Normal respiratory effort, no increased work of breathing.  Neurologic: Grossly intact, no focal deficits, moving all 4 extremities. Psychiatric: Normal mood and affect.   Laboratory Data: See Epic and HPI I have reviewed the labs.   Pertinent Imaging N/A    Assessment & Plan:   1. Prostate cancer - PSA stable - he would like it rechecked in 6 months   2. BPH with LU TS - mild, no bothersome symptoms  -continue conservative management, avoiding bladder irritants and timed voiding's  Return in about 6 months (around 09/06/2024) for PSA only .    Carl Allen   [facility] [address] [place], [place] [address] [phone]

## 2024-03-08 ENCOUNTER — Encounter: Payer: Self-pay | Admitting: Urology

## 2024-03-08 ENCOUNTER — Ambulatory Visit: Payer: Self-pay | Admitting: Urology

## 2024-03-08 VITALS — BP 129/73 | HR 71 | Ht 68.0 in | Wt 160.0 lb

## 2024-03-08 DIAGNOSIS — N138 Other obstructive and reflux uropathy: Secondary | ICD-10-CM

## 2024-03-08 DIAGNOSIS — N401 Enlarged prostate with lower urinary tract symptoms: Secondary | ICD-10-CM | POA: Diagnosis not present

## 2024-03-08 DIAGNOSIS — C61 Malignant neoplasm of prostate: Secondary | ICD-10-CM

## 2024-04-18 ENCOUNTER — Other Ambulatory Visit: Payer: Self-pay | Admitting: Nurse Practitioner

## 2024-04-18 NOTE — Telephone Encounter (Unsigned)
 Copied from CRM [id number]. Topic: Clinical - Medication Refill >> Apr 18, 2024  9:25 AM Montie POUR wrote: Medication:  Tiotropium Bromide  Monohydrate (SPIRIVA  RESPIMAT) 2.5 MCG/ACT AERS Please send 2 refills in to his pharmacy and this will do him until his appointment on 06/20/24.  Has the patient contacted their pharmacy? Yes (Agent: If no, request that the patient contact the pharmacy for the refill. If patient does not wish to contact the pharmacy document the reason why and proceed with request.) (Agent: If yes, when and what did the pharmacy advise?) Pharmacy needs order to refill  This is the patient's preferred pharmacy:  [facility] [address], [place] - [address] [place] [address] Phone: [phone] Fax: [phone]  Is this the correct pharmacy for this prescription? Yes If no, delete pharmacy and type the correct one.   Has the prescription been filled recently? No  Is the patient out of the medication? No - 1 week of medication left  Has the patient been seen for an appointment in the last year OR does the patient have an upcoming appointment? Yes  Can we respond through MyChart? No  Agent: Please be advised that Rx refills may take up to 3 business days. We ask that you follow-up with your pharmacy.

## 2024-04-20 MED ORDER — SPIRIVA RESPIMAT 2.5 MCG/ACT IN AERS: 2.0000 | INHALATION_SPRAY | Freq: Every day | RESPIRATORY_TRACT | 0 refills | Status: DC

## 2024-04-20 NOTE — Telephone Encounter (Signed)
 Requested Prescriptions  Pending Prescriptions Disp Refills   Tiotropium Bromide  (SPIRIVA  RESPIMAT) 2.5 MCG/ACT AERS 4 g 0    Sig: Inhale 2 puffs into the lungs daily.     Pulmonology:  Anticholinergic Agents Passed - 04/20/2024  4:08 PM      Passed - Valid encounter within last 12 months    Recent Outpatient Visits           4 months ago Annual physical exam   [facility] Melvin Pao, NP

## 2024-06-08 ENCOUNTER — Other Ambulatory Visit: Payer: Self-pay | Admitting: Thoracic Surgery (Cardiothoracic Vascular Surgery)

## 2024-06-08 DIAGNOSIS — I7121 Aneurysm of the ascending aorta, without rupture: Secondary | ICD-10-CM

## 2024-06-20 ENCOUNTER — Ambulatory Visit: Admitting: Nurse Practitioner

## 2024-06-20 ENCOUNTER — Encounter: Payer: Self-pay | Admitting: Nurse Practitioner

## 2024-06-20 VITALS — BP 136/81 | HR 72 | Temp 98.2°F | Ht 67.99 in | Wt 165.6 lb

## 2024-06-20 DIAGNOSIS — I10 Essential (primary) hypertension: Secondary | ICD-10-CM

## 2024-06-20 DIAGNOSIS — C61 Malignant neoplasm of prostate: Secondary | ICD-10-CM

## 2024-06-20 DIAGNOSIS — I719 Aortic aneurysm of unspecified site, without rupture: Secondary | ICD-10-CM

## 2024-06-20 DIAGNOSIS — I4891 Unspecified atrial fibrillation: Secondary | ICD-10-CM | POA: Diagnosis not present

## 2024-06-20 DIAGNOSIS — E7800 Pure hypercholesterolemia, unspecified: Secondary | ICD-10-CM

## 2024-06-20 DIAGNOSIS — J410 Simple chronic bronchitis: Secondary | ICD-10-CM | POA: Diagnosis not present

## 2024-06-20 DIAGNOSIS — F1020 Alcohol dependence, uncomplicated: Secondary | ICD-10-CM

## 2024-06-20 MED ORDER — FENOFIBRATE MICRONIZED 134 MG PO CAPS: 134.0000 mg | ORAL_CAPSULE | Freq: Every day | ORAL | 1 refills | Status: AC

## 2024-06-20 MED ORDER — AMLODIPINE BESYLATE 5 MG PO TABS: 5.0000 mg | ORAL_TABLET | Freq: Every day | ORAL | 1 refills | Status: AC

## 2024-06-20 MED ORDER — ATORVASTATIN CALCIUM 80 MG PO TABS: 80.0000 mg | ORAL_TABLET | Freq: Every day | ORAL | 1 refills | Status: AC

## 2024-06-20 MED ORDER — HYDROCHLOROTHIAZIDE 25 MG PO TABS: 25.0000 mg | ORAL_TABLET | Freq: Every day | ORAL | 1 refills | Status: AC

## 2024-06-20 MED ORDER — SPIRIVA RESPIMAT 2.5 MCG/ACT IN AERS: 2.0000 | INHALATION_SPRAY | Freq: Every day | RESPIRATORY_TRACT | 1 refills | Status: AC

## 2024-06-20 MED ORDER — ALBUTEROL SULFATE HFA 108 (90 BASE) MCG/ACT IN AERS: 2.0000 | INHALATION_SPRAY | Freq: Four times a day (QID) | RESPIRATORY_TRACT | 0 refills | Status: AC | PRN

## 2024-06-20 MED ORDER — BENAZEPRIL HCL 40 MG PO TABS: 40.0000 mg | ORAL_TABLET | Freq: Every day | ORAL | 1 refills | Status: AC

## 2024-06-20 NOTE — Assessment & Plan Note (Signed)
 Chronic.  Controlled.  Continue with current medication regimen of Benzepril, Amlodipine , and HCTZ.  Refills sent today.  Blood pressures running 130/80s at home.  Labs ordered today.  Return to clinic in 6 months for reevaluation.  Call sooner if concerns arise.

## 2024-06-20 NOTE — Assessment & Plan Note (Signed)
 Chronic.  Controlled.  Rarely drinking alcohol.  Encouraged continue to abstain from alcohol.  Last drink was New Years Eve.  Labs ordered today.  Return to clinic in 6 months for reevaluation.  Call sooner if concerns arise.

## 2024-06-20 NOTE — Assessment & Plan Note (Signed)
Chronic.  Controlled.  Continue with current medication regimen of Spiriva.  Refilled today.  Rarely uses Albuterol.  Labs ordered today.  Return to clinic in 6 months for reevaluation.  Call sooner if concerns arise.

## 2024-06-20 NOTE — Progress Notes (Signed)
 "  BP 136/81 (BP Location: Left Arm, Patient Position: Sitting, Cuff Size: Normal)   Pulse 72   Temp 98.2 F (36.8 C) (Oral)   Ht 5' 7.99 (1.727 m)   Wt 165 lb 9.6 oz (75.1 kg)   SpO2 99%   BMI 25.19 kg/m    Subjective:    Patient ID: ATREUS HASZ, male    DOB: 1945-08-12, 79 y.o.   MRN: [id number]  HPI: TERRALL BLEY is a 79 y.o. male  Chief Complaint  Patient presents with   office visit    6 month F/u   HYPERTENSION / HYPERLIPIDEMIA Follows up with CT Surgery.  CT angio will be done in February. Satisfied with current treatment? yes Duration of hypertension: years BP monitoring frequency: daily BP range: 130/70 BP medication side effects: no Past BP meds: amlodipine , benazepril , and HCTZ Duration of hyperlipidemia: years Cholesterol medication side effects: yes Cholesterol supplements: none Past cholesterol medications: atorvastain (lipitor) Medication compliance: excellent compliance Aspirin: no Recent stressonors: no Recurrent headaches: no Visual changes: no Palpitations: no Dyspnea: yes Chest pain: no Lower extremity edema: no Dizzy/lightheaded: no  ALCOHOL USE Very seldom drinks alcohol.   Only drinks socially now and that is rare.  He believes his Afib was 11-12 years ago when he was drinking heavy. Last drink was new years eve.  COPD Still using Spirva daily.  Very seldom uses Albuterol .  Feels like his breathing is pretty good. Denies concerns at visit today.  COPD status: controlled Satisfied with current treatment?: yes Oxygen  use: no Dyspnea frequency: when he is active Cough frequency: yes  Rescue inhaler frequency:  very seldom Limitation of activity: yes Productive cough: no Last Spirometry:  Pneumovax: Up to Date Influenza: Up to Date   Relevant past medical, surgical, family and social history reviewed and updated as indicated. Interim medical history since our last visit reviewed. Allergies and medications reviewed and  updated.  Review of Systems  Eyes:  Negative for visual disturbance.  Respiratory:  Negative for shortness of breath.   Cardiovascular:  Negative for chest pain and leg swelling.  Neurological:  Negative for light-headedness and headaches.    Per HPI unless specifically indicated above     Objective:    BP 136/81 (BP Location: Left Arm, Patient Position: Sitting, Cuff Size: Normal)   Pulse 72   Temp 98.2 F (36.8 C) (Oral)   Ht 5' 7.99 (1.727 m)   Wt 165 lb 9.6 oz (75.1 kg)   SpO2 99%   BMI 25.19 kg/m   Wt Readings from Last 3 Encounters:  06/20/24 165 lb 9.6 oz (75.1 kg)  03/08/24 160 lb (72.6 kg)  02/08/24 162 lb (73.5 kg)    Physical Exam Vitals and nursing note reviewed.  Constitutional:      General: He is not in acute distress.    Appearance: Normal appearance. He is not ill-appearing, toxic-appearing or diaphoretic.  HENT:     Head: Normocephalic.     Right Ear: External ear normal.     Left Ear: External ear normal.     Nose: Nose normal. No congestion or rhinorrhea.     Mouth/Throat:     Mouth: Mucous membranes are moist.  Eyes:     General:        Right eye: No discharge.        Left eye: No discharge.     Extraocular Movements: Extraocular movements intact.     Conjunctiva/sclera: Conjunctivae normal.     Pupils:  Pupils are equal, round, and reactive to light.  Cardiovascular:     Rate and Rhythm: Normal rate and regular rhythm.     Heart sounds: No murmur heard. Pulmonary:     Effort: Pulmonary effort is normal. No respiratory distress.     Breath sounds: Normal breath sounds. No wheezing, rhonchi or rales.  Abdominal:     General: Abdomen is flat. Bowel sounds are normal.  Musculoskeletal:     Cervical back: Normal range of motion and neck supple.  Skin:    General: Skin is warm and dry.     Capillary Refill: Capillary refill takes less than 2 seconds.  Neurological:     General: No focal deficit present.     Mental Status: He is alert and  oriented to person, place, and time.  Psychiatric:        Mood and Affect: Mood normal.        Behavior: Behavior normal.        Thought Content: Thought content normal.        Judgment: Judgment normal.     Results for orders placed or performed in visit on 03/01/24  PSA   Collection Time: 03/01/24 10:53 AM  Result Value Ref Range   Prostate Specific Ag, Serum 0.8 0.0 - 4.0 ng/mL      Assessment & Plan:   Problem List Items Addressed This Visit       Cardiovascular and Mediastinum   Essential hypertension   Chronic.  Controlled.  Continue with current medication regimen of Benzepril, Amlodipine , and HCTZ.  Refills sent today.  Blood pressures running 130/80s at home.  Labs ordered today.  Return to clinic in 6 months for reevaluation.  Call sooner if concerns arise.       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  Followed [facility].   He will have an appointment with Dr. Shyrl surgeon after the CT.  AAA has decreased slightly in size since last imaging.  He has his annual CT scheduled for February. Reviewed recent note.       Relevant Medications   amLODipine  (NORVASC ) 5 MG tablet   atorvastatin  (LIPITOR) 80 MG tablet   benazepril  (LOTENSIN ) 40 MG tablet   fenofibrate  micronized (LOFIBRA) 134 MG capsule   hydrochlorothiazide  (HYDRODIURIL ) 25 MG tablet   Other Relevant Orders   Lipid panel   Atrial fibrillation, unspecified type (HCC)   Chronic.  Rate controlled.  Continue with current medication regimen.  Followed by [facility].  Continue to follow up with specialist.  Reviewed recent note.  Labs ordered today.  Return to clinic in 6 months for reevaluation.  Call sooner if concerns arise.       Relevant  Medications   amLODipine  (NORVASC ) 5 MG tablet   atorvastatin  (LIPITOR) 80 MG tablet   benazepril  (LOTENSIN ) 40 MG tablet   fenofibrate  micronized (LOFIBRA) 134 MG capsule   hydrochlorothiazide  (HYDRODIURIL ) 25 MG tablet     Respiratory   COPD (chronic obstructive pulmonary disease) (HCC) - Primary   Chronic.  Controlled.  Continue with current medication regimen of Spiriva .  Refilled today.  Rarely uses Albuterol .  Labs ordered today.  Return to clinic in 6 months for reevaluation.  Call sooner if concerns arise.  Relevant Medications   albuterol  (VENTOLIN  HFA) 108 (90 Base) MCG/ACT inhaler   Tiotropium Bromide  (SPIRIVA  RESPIMAT) 2.5 MCG/ACT AERS     Genitourinary   Prostate cancer (HCC)   Chronic.  Followed by Urology.  Last seen in October 2025.        Other   Chronic alcoholism (HCC)   Chronic.  Controlled.  Rarely drinking alcohol.  Encouraged continue to abstain from alcohol.  Last drink was New Years Eve.  Labs ordered today.  Return to clinic in 6 months for reevaluation.  Call sooner if concerns arise.       Relevant Orders   Comprehensive metabolic panel with GFR   Other Visit Diagnoses       Hypercholesteremia       Relevant Medications   amLODipine  (NORVASC ) 5 MG tablet   atorvastatin  (LIPITOR) 80 MG tablet   benazepril  (LOTENSIN ) 40 MG tablet   fenofibrate  micronized (LOFIBRA) 134 MG capsule   hydrochlorothiazide  (HYDRODIURIL ) 25 MG tablet        Follow up plan: Return in about 6 months (around 12/18/2024) for Physical and Fasting labs.      "

## 2024-06-20 NOTE — Assessment & Plan Note (Signed)
 Chronic.  Followed [facility].   He will have an appointment with Dr. Shyrl surgeon after the CT.  AAA has decreased slightly in size since last imaging.  He has his annual CT scheduled for February. Reviewed recent note.

## 2024-06-20 NOTE — Assessment & Plan Note (Signed)
 Chronic.  Rate controlled.  Continue with current medication regimen.  Followed by [facility].  Continue to follow up with specialist.  Reviewed recent note.  Labs ordered today.  Return to clinic in 6 months for reevaluation.  Call sooner if concerns arise.

## 2024-06-20 NOTE — Assessment & Plan Note (Signed)
 Chronic.  Followed by Urology.  Last seen in October 2025.

## 2024-06-21 ENCOUNTER — Ambulatory Visit: Payer: Self-pay | Admitting: Nurse Practitioner

## 2024-06-21 LAB — COMPREHENSIVE METABOLIC PANEL WITH GFR
ALT: 14 IU/L (ref 0–44)
AST: 14 IU/L (ref 0–40)
Albumin: 4.5 g/dL (ref 3.8–4.8)
Alkaline Phosphatase: 69 IU/L (ref 47–123)
BUN/Creatinine Ratio: 18 (ref 10–24)
BUN: 18 mg/dL (ref 8–27)
Bilirubin Total: 0.4 mg/dL (ref 0.0–1.2)
CO2: 25 mmol/L (ref 20–29)
Calcium: 9.7 mg/dL (ref 8.6–10.2)
Chloride: 104 mmol/L (ref 96–106)
Creatinine, Ser: 1.01 mg/dL (ref 0.76–1.27)
Globulin, Total: 2.2 g/dL (ref 1.5–4.5)
Glucose: 84 mg/dL (ref 70–99)
Potassium: 4.3 mmol/L (ref 3.5–5.2)
Sodium: 144 mmol/L (ref 134–144)
Total Protein: 6.7 g/dL (ref 6.0–8.5)
eGFR: 76 mL/min/1.73

## 2024-06-21 LAB — LIPID PANEL
Chol/HDL Ratio: 4.1 ratio (ref 0.0–5.0)
Cholesterol, Total: 132 mg/dL (ref 100–199)
HDL: 32 mg/dL — ABNORMAL LOW
LDL Chol Calc (NIH): 59 mg/dL (ref 0–99)
Triglycerides: 254 mg/dL — ABNORMAL HIGH (ref 0–149)
VLDL Cholesterol Cal: 41 mg/dL — ABNORMAL HIGH (ref 5–40)

## 2024-07-24 ENCOUNTER — Ambulatory Visit

## 2024-07-28 ENCOUNTER — Telehealth

## 2024-09-06 ENCOUNTER — Other Ambulatory Visit

## 2024-12-20 ENCOUNTER — Encounter: Admitting: Nurse Practitioner

## 2025-02-13 ENCOUNTER — Ambulatory Visit
